# Patient Record
Sex: Female | Born: 1970 | Race: Black or African American | Hispanic: No | Marital: Single | Smoking: Current every day smoker
Health system: Southern US, Community
[De-identification: ages and names within clinical notes are randomized; demographics above are authoritative.]

## PROBLEM LIST (undated history)

## (undated) ENCOUNTER — Ambulatory Visit

## (undated) DIAGNOSIS — F329 Major depressive disorder, single episode, unspecified: Secondary | ICD-10-CM

## (undated) DIAGNOSIS — Q513 Bicornate uterus: Secondary | ICD-10-CM

## (undated) DIAGNOSIS — F32A Depression, unspecified: Secondary | ICD-10-CM

## (undated) DIAGNOSIS — M549 Dorsalgia, unspecified: Secondary | ICD-10-CM

## (undated) DIAGNOSIS — M4800 Spinal stenosis, site unspecified: Secondary | ICD-10-CM

## (undated) DIAGNOSIS — I10 Essential (primary) hypertension: Secondary | ICD-10-CM

## (undated) DIAGNOSIS — F419 Anxiety disorder, unspecified: Secondary | ICD-10-CM

## (undated) DIAGNOSIS — G8929 Other chronic pain: Secondary | ICD-10-CM

## (undated) DIAGNOSIS — M1990 Unspecified osteoarthritis, unspecified site: Secondary | ICD-10-CM

## (undated) HISTORY — PX: TUBAL LIGATION: SHX77

## (undated) HISTORY — DX: Bicornate uterus: Q51.3

## (undated) HISTORY — DX: Other chronic pain: G89.29

## (undated) HISTORY — DX: Dorsalgia, unspecified: M54.9

## (undated) HISTORY — DX: Major depressive disorder, single episode, unspecified: F32.9

## (undated) HISTORY — DX: Essential (primary) hypertension: I10

## (undated) HISTORY — DX: Depression, unspecified: F32.A

## (undated) HISTORY — DX: Unspecified osteoarthritis, unspecified site: M19.90

## (undated) HISTORY — DX: Anxiety disorder, unspecified: F41.9

---

## 2002-05-30 ENCOUNTER — Emergency Department (HOSPITAL_COMMUNITY): Admission: EM | Admit: 2002-05-30 | Discharge: 2002-05-30 | Payer: Self-pay | Admitting: *Deleted

## 2003-02-08 ENCOUNTER — Ambulatory Visit (HOSPITAL_COMMUNITY): Admission: AD | Admit: 2003-02-08 | Discharge: 2003-02-08 | Payer: Self-pay | Admitting: Obstetrics and Gynecology

## 2003-02-14 ENCOUNTER — Emergency Department (HOSPITAL_COMMUNITY): Admission: EM | Admit: 2003-02-14 | Discharge: 2003-02-14 | Payer: Self-pay | Admitting: Emergency Medicine

## 2003-05-30 ENCOUNTER — Ambulatory Visit (HOSPITAL_COMMUNITY): Admission: AD | Admit: 2003-05-30 | Discharge: 2003-05-30 | Payer: Self-pay | Admitting: Internal Medicine

## 2003-06-17 ENCOUNTER — Inpatient Hospital Stay (HOSPITAL_COMMUNITY): Admission: RE | Admit: 2003-06-17 | Discharge: 2003-06-19 | Payer: Self-pay | Admitting: Obstetrics and Gynecology

## 2007-08-28 ENCOUNTER — Encounter: Payer: Self-pay | Admitting: Orthopedic Surgery

## 2007-08-28 ENCOUNTER — Emergency Department (HOSPITAL_COMMUNITY): Admission: EM | Admit: 2007-08-28 | Discharge: 2007-08-28 | Payer: Self-pay | Admitting: Emergency Medicine

## 2007-09-01 ENCOUNTER — Ambulatory Visit: Payer: Self-pay | Admitting: Orthopedic Surgery

## 2007-09-01 DIAGNOSIS — S8263XA Displaced fracture of lateral malleolus of unspecified fibula, initial encounter for closed fracture: Secondary | ICD-10-CM | POA: Insufficient documentation

## 2007-09-02 ENCOUNTER — Encounter: Payer: Self-pay | Admitting: Orthopedic Surgery

## 2007-10-19 ENCOUNTER — Ambulatory Visit: Payer: Self-pay | Admitting: Orthopedic Surgery

## 2007-10-19 ENCOUNTER — Telehealth: Payer: Self-pay | Admitting: Orthopedic Surgery

## 2007-12-03 ENCOUNTER — Encounter: Payer: Self-pay | Admitting: Orthopedic Surgery

## 2007-12-17 ENCOUNTER — Encounter: Payer: Self-pay | Admitting: Orthopedic Surgery

## 2008-11-26 ENCOUNTER — Emergency Department (HOSPITAL_COMMUNITY): Admission: EM | Admit: 2008-11-26 | Discharge: 2008-11-26 | Payer: Self-pay | Admitting: Emergency Medicine

## 2010-05-16 ENCOUNTER — Emergency Department (HOSPITAL_COMMUNITY): Admission: EM | Admit: 2010-05-16 | Discharge: 2010-05-16 | Payer: Self-pay | Admitting: Emergency Medicine

## 2011-02-01 NOTE — Op Note (Signed)
NAME:  Tiffany Morgan, Tiffany Morgan                       ACCOUNT NO.:  [id number]   MEDICAL RECORD NO.:  [id number]                   PATIENT TYPE:  INP   LOCATION:  LDR3                                 FACILITY:  [facility]   PHYSICIAN:  Tilda Burrow, M.D.              DATE OF BIRTH:  1970/11/18   DATE OF PROCEDURE:  06/17/2003  DATE OF DISCHARGE:                                 OPERATIVE REPORT   PREOPERATIVE DIAGNOSES:  1. Pregnancy, 39 weeks.  2. History of breech presentation.  3. Prior cesarean section, not for trial of labor.  4. Desire for elective permanent sterilization.  5. Bicornuate uterus.   POSTOPERATIVE DIAGNOSES:  1. Pregnancy, 39 weeks.  2. History of breech presentation.  3. Prior cesarean section, not for trial of labor.  4. Desire for elective permanent sterilization.  5. Entero-ovarian band.  6. Bicornuate uterus.   PROCEDURES:  1. Repeat low transverse cervical cesarean section.  2. Bilateral partial salpingectomy.  3. Excision of entero-ovarian band.  4. Wide excision of cicatrix.   SURGEON:  Tilda Burrow, M.D.   ANESTHESIA:  Spinal, Johnney Killian, M.D.   COMPLICATIONS:  None.   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  The right  tube was much shorter than the left tube.  The left tube was unusually long,  probably greater than 15 cm in length, with right tube approximately 6 cm  total length.   DESCRIPTION OF PROCEDURE:  The patient was taken to the operating room,  prepped and draped for lower abdominal surgery after spinal anesthesia  introduced.  A Pfannenstiel incision was excised widely, taking out the old  cicatrix, and then we entered the abdominal cavity without difficulty.  The  bladder flap was quite high on the lower uterine segment and was taken down.  Breech  presentation was confirmed with feet being the presenting part.  The  bladder flap was developed, lower uterine segment opened transversely,  extended laterally using index finger traction, membranes ruptured revealing  clear fluid, and the feet grasped for extraction with fundal pressure used  to guide the infant.  Sweeping of the arms forward was performed in a  standard fashion and head delivered with finger in the mouth to guide the  vertex.  Suctioning of the amniotic fluid out of the mouth was performed  upon visualization and the baby cried vigorously.  Francoise Schaumann. Halm, D.O.,  managed the baby.   Apgars of 9 and 9 were assigned by Dr. Milford Cage.  Cord blood samples were  obtained and placenta delivered.  Palpation of the interior of the uterine  cavity revealed the two separate uterine portions.  There was none of the  placenta in the right uterine cavity.  It  was much smaller than the left.  Placenta delivered intact, Schultze presentation, and antibiotic irrigation  of the uterus was performed followed by single-layer running locking  closure.  Chromic 2-0 closure of the bladder flap followed.   The uterus was exteriorized to allow for access to the uterine fundus, and  we then tied the tubes.  The right tube was very short and had phimotic  fimbriae.  We did a distal salpingectomy on this side, taking out the right  distal two-thirds of the tube with ligation with 0 chromic.  The left tube  was unusually elongate and was grasped and a midsegment knuckle of the tube  excised and sent for histology.  Inspection of the ovaries revealed them to  be quite elongate and flat and, surprisingly, had an approximately 15 cm  long, 5 mm wide flexible vascular band between them that was taken out with  a separate specimen.  The uterus was repositioned in the abdomen, the  anterior peritoneum closed with 2-0 chromic, fascia closed with 0 Vicryl,  and subcu tissues reapproximated with 2-0 plain.  It  should be noted that  the fascia was trimmed of some old fibrosis, which resulted in improved  abdominal tone.  Staple closure of the skin completed the procedure with  flat JP drain placed in the subcu space.       ___________________________________________                                            Tilda Burrow, M.D.   JVF/MEDQ  D:  06/17/2003  T:  06/18/2003  Job:  [id number]

## 2011-02-01 NOTE — H&P (Signed)
NAMEAILEENA, Tiffany Morgan NO.:  [id number]   MEDICAL RECORD NO.:  [id number]                    PATIENT TYPE:   LOCATION:                                       FACILITY:   PHYSICIAN:  Tilda Burrow, M.D.              DATE OF BIRTH:  28-Sep-1970   DATE OF ADMISSION:  06/17/2003  DATE OF DISCHARGE:                                HISTORY & PHYSICAL   ADMISSION DIAGNOSES:  Pregnancy, 38-1/[redacted] weeks gestation, prior cesarean  section, not for trial of labor.  History of bicornuate uterus.  Recurrent  breech presentation.  Desire for elective permanent sterilization.   HISTORY OF PRESENT ILLNESS:  This 40 year old female gravida 3, para 1, AB  1, prior cesarean section 1988, performed for fetal distress, performed at  [facility], is admitted at this time for repeat cesarean section.  She has been seen in our office through a pregnancy course since March 19th  with appropriate weight gain and fundal height and growth.  EDC by menstrual  history is 06/27/03, with 19 weeks' ultrasound suggesting October 16, and  first ultrasound at 11-1/2 weeks suggesting July 04, 2003.  She has had a  persistent breech presentation. She desires to proceed with cesarean  delivery.   PAST MEDICAL HISTORY:  Positive for bicornuate uterus.   PAST SURGICAL HISTORY:  Cesarean section 1988, hysterotomy 1990 for a  pregnancy termination that had complications due to a twin pregnancy that  required uterine incision.   ALLERGIES:  NONE.   MEDICATIONS:  Prenatal vitamins.   SOCIAL HISTORY:  Single, stable relationship x9 years, unemployed, lives  with sister.   PHYSICAL EXAMINATION:  VITAL SIGNS:  Height 5 feet 7 inches, weight 190,  blood pressure 122/72.  GENERAL:  A healthy-appearing African-American female, alert and oriented  x3.  HEENT:  Pupils are equal, round and reactive.  Extraocular movements intact.  NECK:  Supple, trachea midline.  LUNGS:  Clear.  ABDOMEN:  Gravid uterus with obvious breech presentation with vertex in the  upper abdomen.  Estimated fetal weight 7 pounds.  PELVIC:  Cervix closed and high at last check.    PRENATAL LABORATORY DATA:  Blood type A negative with RhoGAM administered  July, Rubella immunity  present. Hemoglobin 13, hematocrit 40. Heparin, HIV,  GC, Chlamydia, RPR all negative.  Urine drug screen positive for marijuana  at initial pregnancy intake history.   PLAN:  Repeat cesarean section, tubal ligation on June 17, 2003.     ___________________________________________                                         Tilda Burrow, M.D.   JVF/MEDQ  D:  06/07/2003  T:  06/07/2003  Job:  [id number]   cc:   Francoise Schaumann. Halm, D.O.  [phone]  [place]  [place] [address]  Fax: [phone]

## 2011-02-01 NOTE — Discharge Summary (Signed)
NAME:  Tiffany Morgan, Tiffany Morgan                       ACCOUNT NO.:  [id number]   MEDICAL RECORD NO.:  [id number]                   PATIENT TYPE:  INP   LOCATION:  LDR3                                 FACILITY:  [facility]   PHYSICIAN:  Tilda Burrow, M.D.              DATE OF BIRTH:  [birth year]   DATE OF ADMISSION:  06/17/2003  DATE OF DISCHARGE:  06/19/2003                                 DISCHARGE SUMMARY   ADMISSION DIAGNOSES:  1. Pregnancy at 39 weeks with prior cesarean section, not for trial of     labor, with bicornuate uterus.  2. Elected permanent sterilization.   DISCHARGE DIAGNOSES:  1. Pregnancy at 39 weeks, delivered.  2. Elected permanent sterilization.  3. Bicornuate uterus.  4. Intraovarian adhesive ban, probably congenital.   DISCHARGE MEDICATIONS:  1. Tylox one to two q.4h. p.r.n. pain.  2. Motrin 800 mg one every eight hours for mild discomfort.  3. The patient is bottle-feeding.   HOSPITAL COURSE:  Ms. Sendejo was admitted on June 17, 2003, for repeat  cesarean section and elective sterilization.  She was known to have  bicornuate uterus and the infant was known to be in a breech presentation.  Procedure was uncomplicated and the infant was delivered from a footling  breech presentation delivering a healthy female infant.  Interestingly,  there was some uterine anomalies encountered.  There was a broad, 5 mm wide  x 20 cm long band of connective tissue between the ovaries that had a  congenital appearance due to perfectly symmetric character and smooth  surfaces.  This was transected and removed to reduce potential for  intraabdominal herniation site.  The uterus was noted to be bicornuate.  The  right tube was very short and left tube extremely long.  The infant and the  placenta were located in the left side of the uterus.  Postoperatively, the  patient did excellent with hemoglobin 12.7 and hematocrit 37.1 compared to  13.1 and 37.8 on admission.  The AJP drain  was placed in the subcu fatty  tissue after a wide excision performed.  The patient did well and tolerated  a regular diet.  She was stable for discharge at 48 hours with JP drain  removed at that time and followup scheduled x5 days from now for staple  removal.     ___________________________________________                                         Tilda Burrow, M.D.   JVF/MEDQ  D:  06/19/2003  T:  06/19/2003  Job:  [id number]

## 2013-06-29 ENCOUNTER — Encounter (HOSPITAL_COMMUNITY): Payer: Self-pay | Admitting: Emergency Medicine

## 2013-06-29 ENCOUNTER — Emergency Department (HOSPITAL_COMMUNITY)
Admission: EM | Admit: 2013-06-29 | Discharge: 2013-06-29 | Disposition: A | Discharge status: Home / Self Care | Payer: PRIVATE HEALTH INSURANCE | Attending: Emergency Medicine | Admitting: Emergency Medicine

## 2013-06-29 DIAGNOSIS — R110 Nausea: Secondary | ICD-10-CM | POA: Insufficient documentation

## 2013-06-29 DIAGNOSIS — Y929 Unspecified place or not applicable: Secondary | ICD-10-CM | POA: Insufficient documentation

## 2013-06-29 DIAGNOSIS — S025XXA Fracture of tooth (traumatic), initial encounter for closed fracture: Secondary | ICD-10-CM | POA: Insufficient documentation

## 2013-06-29 DIAGNOSIS — K029 Dental caries, unspecified: Secondary | ICD-10-CM | POA: Insufficient documentation

## 2013-06-29 DIAGNOSIS — K047 Periapical abscess without sinus: Secondary | ICD-10-CM

## 2013-06-29 DIAGNOSIS — R51 Headache: Secondary | ICD-10-CM | POA: Insufficient documentation

## 2013-06-29 DIAGNOSIS — F17200 Nicotine dependence, unspecified, uncomplicated: Secondary | ICD-10-CM | POA: Insufficient documentation

## 2013-06-29 DIAGNOSIS — X58XXXA Exposure to other specified factors, initial encounter: Secondary | ICD-10-CM | POA: Insufficient documentation

## 2013-06-29 DIAGNOSIS — Y939 Activity, unspecified: Secondary | ICD-10-CM | POA: Insufficient documentation

## 2013-06-29 MED ORDER — AMOXICILLIN 500 MG PO CAPS: 500.0000 mg | ORAL_CAPSULE | Freq: Three times a day (TID) | ORAL | Status: DC

## 2013-06-29 MED ORDER — HYDROCODONE-ACETAMINOPHEN 5-325 MG PO TABS: 1.0000 | ORAL_TABLET | ORAL | Status: DC | PRN

## 2013-06-29 NOTE — ED Notes (Signed)
Pt reports dental pain for the past 3 days.  Pt reports trying to see her dentist this a.m but they refused to see her unless she could pay today.  Pt reports the tooth is broken.

## 2013-06-29 NOTE — ED Provider Notes (Signed)
CSN: [id number]     Arrival date & time 06/29/13  1008 History   First MD Initiated Contact with Patient 06/29/13 1045     Chief Complaint  Patient presents with  . Dental Pain   (Consider location/radiation/quality/duration/timing/severity/associated sxs/prior Treatment) Patient is a 42 y.o. female presenting with tooth pain. The history is provided by the patient.  Dental Pain Location:  Lower Lower teeth location:  31/RL 2nd molar Quality:  Throbbing and constant Severity:  Severe (10/10) Onset quality:  Gradual Duration:  3 days Timing:  Constant Progression:  Worsening Chronicity:  New Context: dental caries and dental fracture   Associated symptoms: headaches   Associated symptoms: no fever and no neck pain    Tiffany Morgan is a 42 y.o. female who presents to the ED with dental pain. She states that she broke a tooth in the lower left side about 2 weeks ago but did not start having pain until 3 days ago. The pain has gotten progressively worse despite taking goody powder, tylenol, tylenol PM, Motrin and Advil.    History reviewed. No pertinent past medical history. Past Surgical History  Procedure Laterality Date  . Cesarean section     No family history on file. History  Substance Use Topics  . Smoking status: Current Every Day Smoker  . Smokeless tobacco: Not on file  . Alcohol Use: No   OB History   Grav Para Term Preterm Abortions TAB SAB Ect Mult Living                 Review of Systems  Constitutional: Negative for fever and chills.  HENT: Positive for dental problem. Negative for ear pain, sore throat and trouble swallowing.   Eyes: Negative for visual disturbance.  Respiratory: Negative for cough and shortness of breath.   Gastrointestinal: Positive for nausea. Negative for vomiting and abdominal pain.  Genitourinary: Negative for dysuria and urgency.  Musculoskeletal: Negative for neck pain.  Neurological: Positive for headaches. Negative for  dizziness.  Psychiatric/Behavioral: The patient is not nervous/anxious.     Allergies  Review of patient's allergies indicates no known allergies.  Home Medications   Current Outpatient Rx  Name  Route  Sig  Dispense  Refill  . acetaminophen (TYLENOL) 500 MG tablet   Oral   Take 500 mg by mouth every 6 (six) hours as needed for pain.         . Aspirin-Acetaminophen (GOODYS BODY PAIN PO)   Oral   Take 1 packet by mouth every 6 (six) hours as needed (pain).         Marland Kitchen ibuprofen (ADVIL,MOTRIN) 200 MG tablet   Oral   Take 800 mg by mouth every 4 (four) hours as needed for pain.          BP 118/82  Pulse 91  Temp(Src) 98.3 F (36.8 C) (Oral)  Resp 18  Ht 5\' 7"  (1.702 m)  Wt 150 lb (68.04 kg)  BMI 23.49 kg/m2  SpO2 99%  LMP 06/16/2013 Physical Exam  Nursing note and vitals reviewed. Constitutional: She is oriented to person, place, and time. She appears well-developed and well-nourished. No distress.  HENT:  Head: Normocephalic.  Mouth/Throat: Uvula is midline, oropharynx is clear and moist and mucous membranes are normal.    Lower left second molar with decay, broken and abscessed. Tenderness and swelling of gum surrounding the tooth.  Eyes: Conjunctivae and EOM are normal.  Neck: Neck supple.  Cardiovascular: Normal rate, regular rhythm and normal  heart sounds.   Pulmonary/Chest: Effort normal and breath sounds normal.  Musculoskeletal: Normal range of motion.  Lymphadenopathy:    She has cervical adenopathy (left).  Neurological: She is alert and oriented to person, place, and time. No cranial nerve deficit.  Skin: Skin is warm and dry.  Psychiatric: She has a normal mood and affect. Her behavior is normal.    ED Course  Procedures   MDM  42 y.o. female with dental abscess, broken tooth, carries and pain. Will treat symptoms and she has a dentist that she can follow up with. She is stable for discharge home without any immediate complications.  Discussed  with the patient and all questioned fully answered. She will return if any problems arise.   Medication List    TAKE these medications       amoxicillin 500 MG capsule  Commonly known as:  AMOXIL  Take 1 capsule (500 mg total) by mouth 3 (three) times daily.     HYDROcodone-acetaminophen 5-325 MG per tablet  Commonly known as:  NORCO/VICODIN  Take 1 tablet by mouth every 4 (four) hours as needed.      ASK your doctor about these medications       acetaminophen 500 MG tablet  Commonly known as:  TYLENOL  Take 500 mg by mouth every 6 (six) hours as needed for pain.     GOODYS BODY PAIN PO  Take 1 packet by mouth every 6 (six) hours as needed (pain).     ibuprofen 200 MG tablet  Commonly known as:  ADVIL,MOTRIN  Take 800 mg by mouth every 4 (four) hours as needed for pain.            Janne Napoleon, [place] 06/29/13 [phone]

## 2013-06-29 NOTE — ED Provider Notes (Signed)
Medical screening examination/treatment/procedure(s) were performed by non-physician practitioner and as supervising physician I was immediately available for consultation/collaboration.   Areesha Dehaven L Jedrick Hutcherson, MD 06/29/13 1614 

## 2013-07-21 ENCOUNTER — Other Ambulatory Visit (HOSPITAL_COMMUNITY): Payer: Self-pay | Admitting: *Deleted

## 2013-07-21 DIAGNOSIS — Z1231 Encounter for screening mammogram for malignant neoplasm of breast: Secondary | ICD-10-CM

## 2013-07-27 ENCOUNTER — Ambulatory Visit (HOSPITAL_COMMUNITY)
Admission: RE | Admit: 2013-07-27 | Discharge: 2013-07-27 | Disposition: A | Discharge status: Home / Self Care | Payer: PRIVATE HEALTH INSURANCE | Source: Ambulatory Visit | Attending: *Deleted | Admitting: *Deleted

## 2013-07-27 DIAGNOSIS — Z1231 Encounter for screening mammogram for malignant neoplasm of breast: Secondary | ICD-10-CM

## 2013-07-29 ENCOUNTER — Other Ambulatory Visit: Payer: Self-pay | Admitting: *Deleted

## 2013-07-29 DIAGNOSIS — R928 Other abnormal and inconclusive findings on diagnostic imaging of breast: Secondary | ICD-10-CM

## 2013-08-11 ENCOUNTER — Other Ambulatory Visit: Payer: Self-pay | Admitting: *Deleted

## 2013-08-11 ENCOUNTER — Ambulatory Visit (HOSPITAL_COMMUNITY)
Admission: RE | Admit: 2013-08-11 | Discharge: 2013-08-11 | Disposition: A | Discharge status: Home / Self Care | Payer: PRIVATE HEALTH INSURANCE | Source: Ambulatory Visit | Attending: *Deleted | Admitting: *Deleted

## 2013-08-11 DIAGNOSIS — R928 Other abnormal and inconclusive findings on diagnostic imaging of breast: Secondary | ICD-10-CM | POA: Insufficient documentation

## 2013-08-11 DIAGNOSIS — R921 Mammographic calcification found on diagnostic imaging of breast: Secondary | ICD-10-CM

## 2013-08-17 ENCOUNTER — Ambulatory Visit
Admission: RE | Admit: 2013-08-17 | Discharge: 2013-08-17 | Disposition: A | Discharge status: Home / Self Care | Payer: No Typology Code available for payment source | Source: Ambulatory Visit | Attending: *Deleted | Admitting: *Deleted

## 2013-08-17 DIAGNOSIS — R921 Mammographic calcification found on diagnostic imaging of breast: Secondary | ICD-10-CM

## 2013-08-17 HISTORY — PX: BREAST BIOPSY: SHX20

## 2014-05-07 ENCOUNTER — Encounter (HOSPITAL_COMMUNITY): Payer: Self-pay | Admitting: Emergency Medicine

## 2014-05-07 ENCOUNTER — Emergency Department (HOSPITAL_COMMUNITY)
Admission: EM | Admit: 2014-05-07 | Discharge: 2014-05-07 | Disposition: A | Discharge status: Home / Self Care | Payer: PRIVATE HEALTH INSURANCE | Attending: Emergency Medicine | Admitting: Emergency Medicine

## 2014-05-07 DIAGNOSIS — R109 Unspecified abdominal pain: Secondary | ICD-10-CM | POA: Insufficient documentation

## 2014-05-07 DIAGNOSIS — R5383 Other fatigue: Secondary | ICD-10-CM

## 2014-05-07 DIAGNOSIS — F17200 Nicotine dependence, unspecified, uncomplicated: Secondary | ICD-10-CM | POA: Insufficient documentation

## 2014-05-07 DIAGNOSIS — N390 Urinary tract infection, site not specified: Secondary | ICD-10-CM | POA: Insufficient documentation

## 2014-05-07 DIAGNOSIS — Z9889 Other specified postprocedural states: Secondary | ICD-10-CM | POA: Insufficient documentation

## 2014-05-07 DIAGNOSIS — R5381 Other malaise: Secondary | ICD-10-CM | POA: Insufficient documentation

## 2014-05-07 LAB — URINALYSIS, ROUTINE W REFLEX MICROSCOPIC
BILIRUBIN URINE: NEGATIVE
Glucose, UA: NEGATIVE mg/dL
KETONES UR: NEGATIVE mg/dL
NITRITE: POSITIVE — AB
PH: 6 (ref 5.0–8.0)
Protein, ur: NEGATIVE mg/dL
UROBILINOGEN UA: 0.2 mg/dL (ref 0.0–1.0)

## 2014-05-07 LAB — URINE MICROSCOPIC-ADD ON

## 2014-05-07 MED ORDER — HYDROCODONE-ACETAMINOPHEN 5-325 MG PO TABS: 1.0000 | ORAL_TABLET | ORAL | Status: DC | PRN

## 2014-05-07 MED ORDER — ONDANSETRON HCL 4 MG PO TABS
4.0000 mg | ORAL_TABLET | Freq: Once | ORAL | Status: AC
  Administered 2014-05-07: 4 mg via ORAL
  Filled 2014-05-07: qty 1

## 2014-05-07 MED ORDER — ACETAMINOPHEN 500 MG PO TABS
1000.0000 mg | ORAL_TABLET | Freq: Once | ORAL | Status: AC
  Administered 2014-05-07: 1000 mg via ORAL
  Filled 2014-05-07: qty 2

## 2014-05-07 MED ORDER — IBUPROFEN 800 MG PO TABS
800.0000 mg | ORAL_TABLET | Freq: Once | ORAL | Status: AC
  Administered 2014-05-07: 800 mg via ORAL
  Filled 2014-05-07: qty 1

## 2014-05-07 MED ORDER — CEPHALEXIN 500 MG PO CAPS: 500.0000 mg | ORAL_CAPSULE | Freq: Four times a day (QID) | ORAL | Status: DC

## 2014-05-07 MED ORDER — CEPHALEXIN 500 MG PO CAPS
500.0000 mg | ORAL_CAPSULE | Freq: Once | ORAL | Status: AC
  Administered 2014-05-07: 500 mg via ORAL
  Filled 2014-05-07: qty 1

## 2014-05-07 NOTE — ED Notes (Signed)
Abdominal pain, and having pain in my legs per pt.

## 2014-05-07 NOTE — ED Provider Notes (Signed)
CSN: [id number]     Arrival date & time 05/07/14  1901 History   First MD Initiated Contact with Patient 05/07/14 2055     Chief Complaint  Patient presents with  . Abdominal Pain     (Consider location/radiation/quality/duration/timing/severity/associated sxs/prior Treatment) HPI Comments: Patient is a 43 year old female who presents to the emergency department with complaint of lower abdomen pain. The patient states that over the last 4 or 5 days she's been having increasing lower abdominal pain. The pain is now extending into her legs. The patient states she has tried Tylenol and Goody powders but these are only minimally 6 sizable in helping her discomfort. The patient denies any recent injury to her back, abdomen, or legs. She states she has had some increase in urinary frequency, but no other symptoms. No fever reported. No blood in the urine reported. His been no nausea or vomiting reported. Patient presents at this time for assistance with these symptoms.  Patient is a 43 y.o. female presenting with abdominal pain. The history is provided by the patient.  Abdominal Pain Associated symptoms: fatigue   Associated symptoms: no chest pain, no cough, no dysuria, no hematuria and no shortness of breath     History reviewed. No pertinent past medical history. Past Surgical History  Procedure Laterality Date  . Cesarean section     No family history on file. History  Substance Use Topics  . Smoking status: Current Every Day Smoker  . Smokeless tobacco: Not on file  . Alcohol Use: No   OB History   Grav Para Term Preterm Abortions TAB SAB Ect Mult Living                 Review of Systems  Constitutional: Positive for fatigue. Negative for activity change.       All ROS Neg except as noted in HPI  HENT: Negative for nosebleeds.   Eyes: Negative for photophobia and discharge.  Respiratory: Negative for cough, shortness of breath and wheezing.   Cardiovascular: Negative for chest  pain and palpitations.  Gastrointestinal: Positive for abdominal pain. Negative for blood in stool.  Genitourinary: Positive for frequency. Negative for dysuria and hematuria.  Musculoskeletal: Positive for myalgias. Negative for arthralgias, back pain and neck pain.  Skin: Negative.   Neurological: Negative for dizziness, seizures and speech difficulty.  Psychiatric/Behavioral: Negative for hallucinations and confusion.      Allergies  Review of patient's allergies indicates no known allergies.  Home Medications   Prior to Admission medications   Medication Sig Start Date End Date Taking? Authorizing Provider  acetaminophen (TYLENOL) 500 MG tablet Take 500 mg by mouth every 6 (six) hours as needed for pain.    Historical Provider, MD  Aspirin-Acetaminophen (GOODYS BODY PAIN PO) Take 1 packet by mouth every 6 (six) hours as needed (pain).    Historical Provider, MD  ibuprofen (ADVIL,MOTRIN) 200 MG tablet Take 800 mg by mouth every 4 (four) hours as needed for pain.    Historical Provider, MD   BP 158/86  Pulse 81  Temp(Src) 98.7 F (37.1 C) (Oral)  Resp 20  Ht 5\' 7"  (1.702 m)  Wt 160 lb 8 oz (72.802 kg)  BMI 25.13 kg/m2  SpO2 100%  LMP 04/18/2014 Physical Exam  Nursing note and vitals reviewed. Constitutional: She is oriented to person, place, and time. She appears well-developed and well-nourished.  Non-toxic appearance.  HENT:  Head: Normocephalic.  Right Ear: Tympanic membrane and external ear normal.  Left Ear: Tympanic  membrane and external ear normal.  Eyes: EOM and lids are normal. Pupils are equal, round, and reactive to light.  Neck: Normal range of motion. Neck supple. Carotid bruit is not present.  Cardiovascular: Normal rate, regular rhythm, normal heart sounds, intact distal pulses and normal pulses.   Pulmonary/Chest: Breath sounds normal. No respiratory distress.  Abdominal: Soft. Bowel sounds are normal. There is no tenderness. There is no guarding.  There  is discomfort to palpation suprapubic area. Abdomen is soft with good bowel sounds. No mass appreciated. No distention. No pulsating mass. There is right CVA tenderness.  Musculoskeletal: Normal range of motion.  Lymphadenopathy:       Head (right side): No submandibular adenopathy present.       Head (left side): No submandibular adenopathy present.    She has no cervical adenopathy.  Neurological: She is alert and oriented to person, place, and time. She has normal strength. No cranial nerve deficit or sensory deficit.  Skin: Skin is warm and dry.  Psychiatric: She has a normal mood and affect. Her speech is normal.    ED Course  Procedures (including critical care time) Labs Review Labs Reviewed  URINALYSIS, ROUTINE W REFLEX MICROSCOPIC - Abnormal; Notable for the following:    APPearance HAZY (*)    Specific Gravity, Urine <1.005 (*)    Hgb urine dipstick TRACE (*)    Nitrite POSITIVE (*)    Leukocytes, UA TRACE (*)    All other components within normal limits  URINE MICROSCOPIC-ADD ON - Abnormal; Notable for the following:    Bacteria, UA MANY (*)    All other components within normal limits  URINE CULTURE    Imaging Review No results found.   EKG Interpretation None      MDM  Vital signs are within normal limits. Pulse oximetry 100% on room air. Within normal limits by my interpretation. Urinalysis reveals a hazy specimen with a specific gravity less than 1.005. There is trace hemoglobin, and positive nitrates, trace leukocyte esterase is present with many bacteria present. A culture of the urine has been sent to the lab.  The patient will be treated with Keflex 500 mg, and a prescription for Norco was also given to the patient for discomfort. The patient is advised to see her primary physician, or return to the emergency department if any high fevers, increasing back or abdomen pain, or deterioration in her general condition.    Final diagnoses:  None    *I have  reviewed nursing notes, vital signs, and all appropriate lab and imaging results for this patient.Kathie Dike**    Kaeden Depaz M Maranatha Grossi, PA-C 05/07/14 2155

## 2014-05-07 NOTE — Discharge Instructions (Signed)
Your tests reveal a urinary tract infection. A culture has been sent to the lab. Please use Keflex and 600 mg of ibuprofen 4 times daily (with each meal and at bedtime). Please use Norco for pain if needed. This medication may cause drowsiness, please use with caution. Please increase water and juices. Please return to your primary physician, or to the emergency department if any nausea, vomiting, high fever, excessive back pain, excessive abdominal pain, or deterioration in her general condition. Urinary Tract Infection Urinary tract infections (UTIs) can develop anywhere along your urinary tract. Your urinary tract is your body's drainage system for removing wastes and extra water. Your urinary tract includes two kidneys, two ureters, a bladder, and a urethra. Your kidneys are a pair of bean-shaped organs. Each kidney is about the size of your fist. They are located below your ribs, one on each side of your spine. CAUSES Infections are caused by microbes, which are microscopic organisms, including fungi, viruses, and bacteria. These organisms are so small that they can only be seen through a microscope. Bacteria are the microbes that most commonly cause UTIs. SYMPTOMS  Symptoms of UTIs may vary by age and gender of the patient and by the location of the infection. Symptoms in young women typically include a frequent and intense urge to urinate and a painful, burning feeling in the bladder or urethra during urination. Older women and men are more likely to be tired, shaky, and weak and have muscle aches and abdominal pain. A fever may mean the infection is in your kidneys. Other symptoms of a kidney infection include pain in your back or sides below the ribs, nausea, and vomiting. DIAGNOSIS To diagnose a UTI, your caregiver will ask you about your symptoms. Your caregiver also will ask to provide a urine sample. The urine sample will be tested for bacteria and white blood cells. White blood cells are made  by your body to help fight infection. TREATMENT  Typically, UTIs can be treated with medication. Because most UTIs are caused by a bacterial infection, they usually can be treated with the use of antibiotics. The choice of antibiotic and length of treatment depend on your symptoms and the type of bacteria causing your infection. HOME CARE INSTRUCTIONS  If you were prescribed antibiotics, take them exactly as your caregiver instructs you. Finish the medication even if you feel better after you have only taken some of the medication.  Drink enough water and fluids to keep your urine clear or pale yellow.  Avoid caffeine, tea, and carbonated beverages. They tend to irritate your bladder.  Empty your bladder often. Avoid holding urine for long periods of time.  Empty your bladder before and after sexual intercourse.  After a bowel movement, women should cleanse from front to back. Use each tissue only once. SEEK MEDICAL CARE IF:   You have back pain.  You develop a fever.  Your symptoms do not begin to resolve within 3 days. SEEK IMMEDIATE MEDICAL CARE IF:   You have severe back pain or lower abdominal pain.  You develop chills.  You have nausea or vomiting.  You have continued burning or discomfort with urination. MAKE SURE YOU:   Understand these instructions.  Will watch your condition.  Will get help right away if you are not doing well or get worse. Document Released: 06/12/2005 Document Revised: 03/03/2012 Document Reviewed: 10/11/2011 [facility] Patient Information 2015 [employer], [place]. This information is not intended to replace advice given to you by your health  care provider. Make sure you discuss any questions you have with your health care provider.

## 2014-05-08 NOTE — ED Provider Notes (Signed)
Medical screening examination/treatment/procedure(s) were performed by non-physician practitioner and as supervising physician I was immediately available for consultation/collaboration.   EKG Interpretation None        Glynn Octave, MD 05/08/14 [phone]

## 2014-05-10 LAB — URINE CULTURE

## 2014-05-11 ENCOUNTER — Telehealth (HOSPITAL_COMMUNITY): Payer: Self-pay

## 2014-05-11 NOTE — ED Notes (Signed)
Post ED Visit - Positive Culture Follow-up  Culture report reviewed by antimicrobial stewardship pharmacist:  Wes Dulaney, Pharm.D., BCPS  Celedonio Miyamoto, [address].D., BCPS  Georgina Pillion, [address].D., BCPS  [place], [place].D., BCPS, AAHIVP  Estella Husk, Pharm.D., BCPS, AAHIVP  Red Christians, Pharm.D.  Tennis Must, Pharm.D.  Positive urine culture Treated with cephalexin, organism sensitive to the same and no further patient follow-up is required at this time.  Ashley Jacobs 05/11/2014, 10:25 AM

## 2014-06-20 ENCOUNTER — Encounter (HOSPITAL_COMMUNITY): Payer: Self-pay | Admitting: Emergency Medicine

## 2014-06-20 ENCOUNTER — Emergency Department (HOSPITAL_COMMUNITY)
Admission: EM | Admit: 2014-06-20 | Discharge: 2014-06-20 | Disposition: A | Discharge status: Home / Self Care | Payer: PRIVATE HEALTH INSURANCE | Attending: Emergency Medicine | Admitting: Emergency Medicine

## 2014-06-20 ENCOUNTER — Emergency Department (HOSPITAL_COMMUNITY): Payer: PRIVATE HEALTH INSURANCE

## 2014-06-20 DIAGNOSIS — Z720 Tobacco use: Secondary | ICD-10-CM | POA: Insufficient documentation

## 2014-06-20 DIAGNOSIS — R079 Chest pain, unspecified: Secondary | ICD-10-CM | POA: Insufficient documentation

## 2014-06-20 DIAGNOSIS — Z880 Allergy status to penicillin: Secondary | ICD-10-CM | POA: Insufficient documentation

## 2014-06-20 DIAGNOSIS — Z8719 Personal history of other diseases of the digestive system: Secondary | ICD-10-CM | POA: Insufficient documentation

## 2014-06-20 DIAGNOSIS — I10 Essential (primary) hypertension: Secondary | ICD-10-CM | POA: Insufficient documentation

## 2014-06-20 DIAGNOSIS — E119 Type 2 diabetes mellitus without complications: Secondary | ICD-10-CM | POA: Insufficient documentation

## 2014-06-20 LAB — CBC WITH DIFFERENTIAL/PLATELET
Basophils Absolute: 0.1 10*3/uL (ref 0.0–0.1)
Basophils Relative: 1 % (ref 0–1)
EOS PCT: 4 % (ref 0–5)
Eosinophils Absolute: 0.5 10*3/uL (ref 0.0–0.7)
HCT: 39.1 % (ref 36.0–46.0)
Hemoglobin: 13.6 g/dL (ref 12.0–15.0)
LYMPHS ABS: 5.9 10*3/uL — AB (ref 0.7–4.0)
Lymphocytes Relative: 53 % — ABNORMAL HIGH (ref 12–46)
MCH: 31.9 pg (ref 26.0–34.0)
MCHC: 34.8 g/dL (ref 30.0–36.0)
MCV: 91.6 fL (ref 78.0–100.0)
MONO ABS: 0.5 10*3/uL (ref 0.1–1.0)
Monocytes Relative: 5 % (ref 3–12)
Neutro Abs: 4.2 10*3/uL (ref 1.7–7.7)
Neutrophils Relative %: 37 % — ABNORMAL LOW (ref 43–77)
Platelets: 338 10*3/uL (ref 150–400)
RBC: 4.27 MIL/uL (ref 3.87–5.11)
RDW: 14.9 % (ref 11.5–15.5)
WBC: 11.2 10*3/uL — ABNORMAL HIGH (ref 4.0–10.5)

## 2014-06-20 LAB — BASIC METABOLIC PANEL
ANION GAP: 11 (ref 5–15)
BUN: 9 mg/dL (ref 6–23)
CALCIUM: 9.5 mg/dL (ref 8.4–10.5)
CHLORIDE: 104 meq/L (ref 96–112)
CO2: 26 mEq/L (ref 19–32)
Creatinine, Ser: 0.78 mg/dL (ref 0.50–1.10)
GFR calc Af Amer: >90 mL/min (ref >90)
GFR calc non Af Amer: >90 mL/min (ref >90)
GLUCOSE: 103 mg/dL — AB (ref 70–99)
POTASSIUM: 3.9 meq/L (ref 3.7–5.3)
Sodium: 141 mEq/L (ref 137–147)

## 2014-06-20 LAB — TROPONIN I

## 2014-06-20 MED ORDER — GI COCKTAIL ~~LOC~~
30.0000 mL | Freq: Once | ORAL | Status: AC
  Administered 2014-06-20: 30 mL via ORAL
  Filled 2014-06-20: qty 30

## 2014-06-20 MED ORDER — ASPIRIN 81 MG PO CHEW
324.0000 mg | CHEWABLE_TABLET | Freq: Once | ORAL | Status: AC
  Administered 2014-06-20: 324 mg via ORAL
  Filled 2014-06-20: qty 4

## 2014-06-20 MED ORDER — OMEPRAZOLE 20 MG PO CPDR: 20.0000 mg | DELAYED_RELEASE_CAPSULE | Freq: Every day | ORAL | Status: DC

## 2014-06-20 NOTE — ED Notes (Signed)
Pt alert & oriented x4, stable gait. Patient given discharge instructions, paperwork & prescription(s). Patient  instructed to stop at the registration desk to finish any additional paperwork. Patient verbalized understanding. Pt left department w/ no further questions. 

## 2014-06-20 NOTE — Discharge Instructions (Signed)

## 2014-06-20 NOTE — ED Notes (Signed)
EKG performed upon arrival in room 2 in [facility].  Handed EKG to Dr Lars MageI Knapp.

## 2014-06-20 NOTE — ED Notes (Signed)
Pt c/o sharp chest pain that started 30 mins pta; pt states she was shopping when the chest pain started;

## 2014-06-20 NOTE — ED Provider Notes (Signed)
CSN: [id number]     Arrival date & time 06/20/14  4098 History   This chart was scribed for Lyanne Co, MD by Gwenyth Ober, ED Scribe. This patient was seen in room [facility] and the patient's care was started at 7:17 PM.    Chief Complaint  Patient presents with  . Chest Pain   The history is provided by the patient. No language interpreter was used.   HPI Comments: Tiffany Morgan is a 43 y.o. female with a history of GERD, HTN, and DM who presents to the Emergency Department complaining of sharp, intermittent chest pain that started 30 min PTA. Pt states that this is the fourth episode of similar symptoms in 3 weeks. Pt has not identified a common trigger for the symptoms. Pt states pain may last up to an hour. Pt has hx of GERD, but states current symptoms are not similar. 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 Pt denies nausea, vomiting and SOB today, but states nausea and vomitting have accompanied prior episodes. Pt denies a family hx of CAD <55 y/o. She is a current smoker.   History reviewed. No pertinent past medical history. Past Surgical History  Procedure Laterality Date  . Cesarean section     History reviewed. No pertinent family history. History  Substance Use Topics  . Smoking status: Current Every Day Smoker  . Smokeless tobacco: Not on file  . Alcohol Use: No   OB History   Grav Para Term Preterm Abortions TAB SAB Ect Mult Living                 Review of Systems  Respiratory: Negative for shortness of breath.   Cardiovascular: Positive for chest pain.  Gastrointestinal: Negative for nausea and vomiting.   10 Systems reviewed and all are negative for acute change except as noted in the HPI.  Allergies  Review of patient's allergies indicates no known allergies.  Home Medications   Prior to Admission medications   Medication Sig Start Date End Date Taking? Authorizing Provider  acetaminophen (TYLENOL) 500 MG tablet Take 500 mg by mouth  every 6 (six) hours as needed for pain.    Historical Provider, MD  Aspirin-Acetaminophen (GOODYS BODY PAIN PO) Take 1 packet by mouth every 6 (six) hours as needed (pain).    Historical Provider, MD  cephALEXin (KEFLEX) 500 MG capsule Take 1 capsule (500 mg total) by mouth 4 (four) times daily. 05/07/14   Kathie Dike, PA-C  HYDROcodone-acetaminophen (NORCO/VICODIN) 5-325 MG per tablet Take 1 tablet by mouth every 4 (four) hours as needed. 05/07/14   Kathie Dike, PA-C  ibuprofen (ADVIL,MOTRIN) 200 MG tablet Take 800 mg by mouth every 4 (four) hours as needed for pain.    Historical Provider, MD   LMP 06/13/2014 Physical Exam  Nursing note and vitals reviewed. Constitutional: She is oriented to person, place, and time. She appears well-developed and well-nourished. No distress.  HENT:  Head: Normocephalic and atraumatic.  Eyes: EOM are normal.  Neck: Normal range of motion.  Cardiovascular: Normal rate, regular rhythm and normal heart sounds.   Pulmonary/Chest: Effort normal and breath sounds normal.  Abdominal: Soft. She exhibits no distension. There is no tenderness.  Musculoskeletal: Normal range of motion.  Neurological: She is alert and oriented to person, place, and time.  Skin: Skin is warm and dry.  Psychiatric: She has a normal mood and affect. Judgment normal.    ED Course  Procedures (including critical  care time)  DIAGNOSTIC STUDIES: Oxygen Saturation is 100% on RA, normal by my interpretation.    COORDINATION OF CARE: 7:21 PM Will order chest x-ray and lab work. Discussed treatment plan with pt at bedside and pt agreed to plan.   Labs Review Labs Reviewed  BASIC METABOLIC PANEL - Abnormal; Notable for the following:    Glucose, Bld 103 (*)    All other components within normal limits  CBC WITH DIFFERENTIAL - Abnormal; Notable for the following:    WBC 11.2 (*)    Neutrophils Relative % 37 (*)    Lymphocytes Relative 53 (*)    Lymphs Abs 5.9 (*)    All  other components within normal limits  TROPONIN I    Imaging Review Dg Chest Portable 1 View  06/20/2014   CLINICAL DATA:  Intermittent sharp chest pain for 3 weeks. Smoker. Initial encounter.  EXAM: PORTABLE CHEST - 1 VIEW  COMPARISON:  None.  FINDINGS: 1925 hr  The heart size and mediastinal contours are normal. The lungs are clear. There is no pleural effusion or pneumothorax. No acute osseous findings are identified. Telemetry leads overlie the chest.  IMPRESSION: No active cardiopulmonary process.   Electronically Signed   By: Roxy HorsemanBill  Veazey M.D.   On: 06/20/2014 19:39  I personally reviewed the imaging tests through PACS system I reviewed available ER/hospitalization records through the EMR    EKG Interpretation   Date/Time:  Monday June 20 2014 19:07:15 EDT Ventricular Rate:  86 PR Interval:  146 QRS Duration: 87 QT Interval:  358 QTC Calculation: 428 R Axis:   6 Text Interpretation:  Sinus rhythm Low voltage, precordial leads No old  tracing to compare Confirmed by Raguel Kosloski  MD, Caryn BeeKEVIN ([id number]) on 06/20/2014  7:10:36 PM      MDM   Final diagnoses:  Chest pain, unspecified chest pain type   Atypical chest pain.  Doubt gastroesophageal reflux disease.  Patient be placed on Prilosec.  Doubt acute coronary syndrome.  Patient is PERC negative.  DC home.  She understands to return to the ER for new or resting symptoms  I personally performed the services described in this documentation, which was scribed in my presence. The recorded information has been reviewed and is accurate.        Lyanne CoKevin M Bijou Easler, MD 06/20/14 2006

## 2016-01-16 ENCOUNTER — Other Ambulatory Visit (HOSPITAL_COMMUNITY): Payer: Self-pay | Admitting: *Deleted

## 2016-01-16 ENCOUNTER — Other Ambulatory Visit (HOSPITAL_COMMUNITY): Payer: Self-pay | Admitting: Nurse Practitioner

## 2016-01-16 DIAGNOSIS — Z1231 Encounter for screening mammogram for malignant neoplasm of breast: Secondary | ICD-10-CM

## 2016-01-17 ENCOUNTER — Ambulatory Visit (HOSPITAL_COMMUNITY)
Admission: RE | Admit: 2016-01-17 | Discharge: 2016-01-17 | Disposition: A | Discharge status: Home / Self Care | Payer: Medicaid Other | Source: Ambulatory Visit | Attending: *Deleted | Admitting: *Deleted

## 2016-01-17 DIAGNOSIS — Z1231 Encounter for screening mammogram for malignant neoplasm of breast: Secondary | ICD-10-CM | POA: Insufficient documentation

## 2016-01-19 ENCOUNTER — Other Ambulatory Visit: Payer: Self-pay | Admitting: *Deleted

## 2016-01-19 DIAGNOSIS — R928 Other abnormal and inconclusive findings on diagnostic imaging of breast: Secondary | ICD-10-CM

## 2016-01-26 ENCOUNTER — Other Ambulatory Visit: Payer: Medicaid Other

## 2016-01-29 ENCOUNTER — Other Ambulatory Visit: Payer: Self-pay | Admitting: *Deleted

## 2016-01-29 ENCOUNTER — Ambulatory Visit
Admission: RE | Admit: 2016-01-29 | Discharge: 2016-01-29 | Disposition: A | Discharge status: Home / Self Care | Payer: Medicaid Other | Source: Ambulatory Visit | Attending: *Deleted | Admitting: *Deleted

## 2016-01-29 DIAGNOSIS — N6320 Unspecified lump in the left breast, unspecified quadrant: Secondary | ICD-10-CM

## 2016-01-29 DIAGNOSIS — R928 Other abnormal and inconclusive findings on diagnostic imaging of breast: Secondary | ICD-10-CM

## 2016-01-31 ENCOUNTER — Ambulatory Visit
Admission: RE | Admit: 2016-01-31 | Discharge: 2016-01-31 | Disposition: A | Discharge status: Home / Self Care | Payer: Medicaid Other | Source: Ambulatory Visit | Attending: *Deleted | Admitting: *Deleted

## 2016-01-31 ENCOUNTER — Other Ambulatory Visit: Payer: Medicaid Other

## 2016-01-31 DIAGNOSIS — N6320 Unspecified lump in the left breast, unspecified quadrant: Secondary | ICD-10-CM

## 2016-01-31 HISTORY — PX: BREAST BIOPSY: SHX20

## 2016-05-21 ENCOUNTER — Emergency Department (HOSPITAL_COMMUNITY): Payer: Medicaid Other

## 2016-05-21 ENCOUNTER — Emergency Department (HOSPITAL_COMMUNITY)
Admission: EM | Admit: 2016-05-21 | Discharge: 2016-05-21 | Disposition: A | Discharge status: Home / Self Care | Payer: Medicaid Other | Attending: Emergency Medicine | Admitting: Emergency Medicine

## 2016-05-21 ENCOUNTER — Encounter (HOSPITAL_COMMUNITY): Payer: Self-pay | Admitting: Emergency Medicine

## 2016-05-21 DIAGNOSIS — M545 Low back pain, unspecified: Secondary | ICD-10-CM

## 2016-05-21 DIAGNOSIS — F17210 Nicotine dependence, cigarettes, uncomplicated: Secondary | ICD-10-CM | POA: Insufficient documentation

## 2016-05-21 DIAGNOSIS — M5136 Other intervertebral disc degeneration, lumbar region: Secondary | ICD-10-CM | POA: Insufficient documentation

## 2016-05-21 LAB — POC URINE PREG, ED: PREG TEST UR: NEGATIVE

## 2016-05-21 MED ORDER — DEXAMETHASONE 4 MG PO TABS: 4.0000 mg | ORAL_TABLET | Freq: Two times a day (BID) | ORAL | 0 refills | Status: DC

## 2016-05-21 MED ORDER — KETOROLAC TROMETHAMINE 10 MG PO TABS
10.0000 mg | ORAL_TABLET | Freq: Once | ORAL | Status: AC
  Administered 2016-05-21: 10 mg via ORAL
  Filled 2016-05-21: qty 1

## 2016-05-21 MED ORDER — PREDNISONE 20 MG PO TABS
40.0000 mg | ORAL_TABLET | Freq: Once | ORAL | Status: AC
  Administered 2016-05-21: 40 mg via ORAL
  Filled 2016-05-21: qty 2

## 2016-05-21 MED ORDER — DICLOFENAC SODIUM 75 MG PO TBEC
75.0000 mg | DELAYED_RELEASE_TABLET | Freq: Two times a day (BID) | ORAL | 0 refills | Status: DC
Start: 2016-05-21 — End: 2017-01-16

## 2016-05-21 MED ORDER — CHLORZOXAZONE 500 MG PO TABS
500.0000 mg | ORAL_TABLET | Freq: Three times a day (TID) | ORAL | 0 refills | Status: DC
Start: 2016-05-21 — End: 2017-02-21

## 2016-05-21 MED ORDER — ONDANSETRON HCL 4 MG PO TABS
4.0000 mg | ORAL_TABLET | Freq: Once | ORAL | Status: AC
  Administered 2016-05-21: 4 mg via ORAL
  Filled 2016-05-21: qty 1

## 2016-05-21 NOTE — ED Provider Notes (Signed)
[facility] Provider Note   CSN: [id number] Arrival date & time: 05/21/16  [id number]     History   Chief Complaint Chief Complaint  Patient presents with  . Back Pain    HPI Tiffany Morgan is a 45 y.o. female.  The history is provided by the patient.  Back Pain   This is a chronic problem. The problem occurs daily. The problem has been gradually worsening. The pain is associated with twisting and lifting heavy objects. The pain is present in the lumbar spine. The quality of the pain is described as shooting and aching. The pain is moderate. The symptoms are aggravated by bending, twisting and certain positions. The pain is the same all the time. Pertinent negatives include no fever, no numbness, no abdominal pain, no dysuria, no paresthesias, no tingling and no weakness. She has tried NSAIDs and muscle relaxants for the symptoms.    History reviewed. No pertinent past medical history.  Patient Active Problem List   Diagnosis Date Noted  . CLOSED FRACTURE OF LATERAL MALLEOLUS 09/01/2007    Past Surgical History:  Procedure Laterality Date  . CESAREAN SECTION      OB History    No data available       Home Medications    Prior to Admission medications   Medication Sig Start Date End Date Taking? Authorizing Provider  Aspirin-Acetaminophen (GOODYS BODY PAIN PO) Take 1 packet by mouth every 6 (six) hours as needed (pain).    Historical Provider, MD  ibuprofen (ADVIL,MOTRIN) 200 MG tablet Take 800 mg by mouth every 4 (four) hours as needed for pain.    Historical Provider, MD  omeprazole (PRILOSEC) 20 MG capsule Take 1 capsule (20 mg total) by mouth daily. 06/20/14   Azalia BilisKevin Campos, MD    Family History History reviewed. No pertinent family history.  Social History Social History  Substance Use Topics  . Smoking status: Current Every Day Smoker    Packs/day: 0.50    Types: Cigarettes  . Smokeless tobacco: Not on file  . Alcohol use No     Allergies     Review of patient's allergies indicates no known allergies.   Review of Systems Review of Systems  Constitutional: Negative for fever.  Gastrointestinal: Negative for abdominal pain.  Genitourinary: Negative for dysuria.  Musculoskeletal: Positive for back pain.  Neurological: Negative for tingling, weakness, numbness and paresthesias.  All other systems reviewed and are negative.    Physical Exam Updated Vital Signs BP 142/88 (BP Location: Left Arm)   Pulse 101   Temp 98 F (36.7 C) (Oral)   Resp 16   Ht 5\' 7"  (1.702 m)   Wt 77.1 kg   LMP 05/18/2016   SpO2 100%   BMI 26.63 kg/m   Physical Exam  Constitutional: She is oriented to person, place, and time. She appears well-developed and well-nourished.  Non-toxic appearance.  HENT:  Head: Normocephalic.  Right Ear: Tympanic membrane and external ear normal.  Left Ear: Tympanic membrane and external ear normal.  Eyes: EOM and lids are normal. Pupils are equal, round, and reactive to light.  Neck: Normal range of motion. Neck supple. Carotid bruit is not present.  Cardiovascular: Normal rate, regular rhythm, normal heart sounds, intact distal pulses and normal pulses.   Pulmonary/Chest: Breath sounds normal. No respiratory distress.  Abdominal: Soft. Bowel sounds are normal. There is no tenderness. There is no guarding.  Musculoskeletal: Normal range of motion.  Lymphadenopathy:  Head (right side): No submandibular adenopathy present.       Head (left side): No submandibular adenopathy present.    She has no cervical adenopathy.  Neurological: She is alert and oriented to person, place, and time. She has normal strength. No cranial nerve deficit or sensory deficit.  Skin: Skin is warm and dry.  Psychiatric: She has a normal mood and affect. Her speech is normal.  Nursing note and vitals reviewed.    ED Treatments / Results  Labs (all labs ordered are listed, but only abnormal results are displayed) Labs  Reviewed - No data to display  EKG  EKG Interpretation None       Radiology No results found. Results for orders placed or performed during the hospital encounter of 05/21/16  POC Urine Pregnancy, ED (do NOT order at [facility])  Result Value Ref Range   Preg Test, Ur NEGATIVE NEGATIVE   Dg Lumbar Spine Complete  Result Date: 05/21/2016 CLINICAL DATA:  Lumbago, chronic EXAM: LUMBAR SPINE - COMPLETE 4+ VIEW COMPARISON:  None. FINDINGS: Frontal, lateral, spot lumbosacral lateral, and bilateral oblique views were obtained. There are 5 non-rib-bearing lumbar type vertebral bodies. There is no fracture or spondylolisthesis. There is mild disc space narrowing at T12-L1, L1-2, and L2-3. There is no appreciable facet narrowing. There is mild calcification in the distal aorta. IMPRESSION: Mild osteoarthritic change. No fracture or spondylolisthesis. Aortic atherosclerosis noted. Electronically Signed   By: Bretta Bang III M.D.   On: 05/21/2016 10:04   Procedures Procedures (including critical care time)  Medications Ordered in ED Medications - No data to display   Initial Impression / Assessment and Plan / ED Course  I have reviewed the triage vital signs and the nursing notes.  Pertinent labs & imaging results that were available during my care of the patient were reviewed by me and considered in my medical decision making (see chart for details).  Clinical Course    *I have reviewed nursing notes, vital signs, and all appropriate lab and imaging results for this patient.**  Final Clinical Impressions(s) / ED Diagnoses  There is disc space narrowing at the T12-L1, L1-L2 area. No fx or dislocation. Osteoarthritis changes also noted. Discussed findings with the patient in terms she can understand. No evidence for caudal equina or emergent changes. Pt referred to orthopedics. Rx for decadron, diclofenac, and parafon forte given to the patient. She is in agreement with this plan.   Final  diagnoses:  None    New Prescriptions New Prescriptions   No medications on file     Ivery Quale, PA-C 05/23/16 1250    Rolland Porter, MD 06/01/16 [phone]

## 2016-05-21 NOTE — ED Triage Notes (Signed)
Reports lower back pain for several months now.  States light housework and activity exacerbate the pain.  Was put on Flexeril by pcp, but not helping.

## 2016-05-21 NOTE — Discharge Instructions (Signed)
Your vital signs are within normal limits. The x-ray of your back suggest arthritis changes and possible degenerative disc changes of your lumbar area. There is some mild spasm problem on the examination of your lower back. Heating pad to your lower back maybe helpful. Please rest your back is much as possible. Please use Decadron and diclofenac 2 times daily. Use Parafon forte at bed time, or three times daily for spasm.This medication may cause drowsiness. Please do not drink, drive, or participate in activity that requires concentration while taking this medication. Please see Dr Earlie CountsHewett for additional evaluation of your back.

## 2016-08-13 ENCOUNTER — Ambulatory Visit (HOSPITAL_COMMUNITY): Payer: Medicaid Other | Attending: Nurse Practitioner

## 2016-08-13 DIAGNOSIS — M62830 Muscle spasm of back: Secondary | ICD-10-CM

## 2016-08-13 DIAGNOSIS — G8929 Other chronic pain: Secondary | ICD-10-CM | POA: Insufficient documentation

## 2016-08-13 DIAGNOSIS — M545 Low back pain, unspecified: Secondary | ICD-10-CM

## 2016-08-13 DIAGNOSIS — R293 Abnormal posture: Secondary | ICD-10-CM | POA: Insufficient documentation

## 2016-08-13 NOTE — Patient Instructions (Addendum)

## 2016-08-13 NOTE — Therapy (Signed)
Rehab Potential Fair   Clinical Impairments Affecting Rehab Potential Limited coverage by insurance. Visits limitations.    PT Frequency One time visit   PT Treatment/Interventions Dry needling;Passive range of motion;Therapeutic exercise;Therapeutic activities;Patient/family education;Manual techniques   PT Home Exercise Plan Bridge, DKTC, SKTC, Reverse Crunch, Double bent knee raise, Bird Dog, Cat/cow.    Consulted and Agree with Plan of Care Patient      Patient will benefit from skilled therapeutic intervention in order to improve the following deficits and impairments:  Abnormal gait, Decreased range of motion, Obesity, Pain, Increased muscle spasms, Decreased strength, Hypomobility, Postural dysfunction, Impaired flexibility  Visit Diagnosis: Chronic bilateral low back pain without sciatica - Plan: PT plan of care cert/re-cert  Muscle spasm of back - Plan: PT plan of care cert/re-cert  Abnormal posture - Plan: PT plan of care cert/re-cert     Problem List Patient Active Problem List   Diagnosis Date Noted  . CLOSED FRACTURE OF LATERAL MALLEOLUS 09/01/2007    11:16 AM, 08/13/16 Etta Grandchild, PT, DPT Physical Therapist at [facility] [phone] (office)     [facility] [address] [place], [place], [address] Phone: [phone]   Fax:  [phone]  Name: GISSELLE GALVIS MRN: [id number] Date of Birth: 10-07-1970  PHYSICAL THERAPY DISCHARGE SUMMARY  Visits from Start of Care: 1  Current functional level related to goals / functional outcomes: *see below   Remaining deficits: *see below   Education / Equipment: *see below Plan: Patient agrees to discharge.  Patient goals were not met. Patient is being discharged due to                                                     ????? No insurance coverage for this problem.            11:16 AM, 08/13/16 Etta Grandchild, PT, DPT Physical Therapist at [facility] [phone] (office)             [facility] [address] [place], [place], [address] Phone: [phone]   Fax:  [phone]  Physical Therapy Evaluation  Patient Details  Name: Tiffany Morgan MRN: [id number] Date of Birth: 07/30/1971 Referring Provider: Abran Cantor  Encounter Date: 08/13/2016      PT End of Session - 08/13/16 1054    Visit Number 1   Authorization Type Medicaid    Authorization Time Period 08/13/16-08/16/16   Authorization - Visit Number 1   PT Start Time 0947   PT Stop Time 1038   PT Time Calculation (min) 51 min   Activity Tolerance Patient tolerated treatment well   Behavior During Therapy [facility] for tasks assessed/performed      No past medical history on file.  Past Surgical History:  Procedure Laterality Date  . CESAREAN SECTION      There were no vitals filed for this visit.       Subjective Assessment - 08/13/16 0949    Subjective Pt reports her chronic LBP has gotten much worse to the point where it is now interfering with her daily activity.    Pertinent History LBP for six years, worse on the left side, extending from the lumbosacral junction to the T10 level, about 2-3 inches out from the spinous process. Inititally this was painful only at night, but over the last few years it has gotten more painful and more apparent during the day throughout her  house activities. Pt reports with prolonged standing + weight shifting, she can feel a cliking/popping in the low back area.    Limitations Standing;Walking;Sitting   How long can you sit comfortably? 30-60 minutes    How long can you stand comfortably? 15-20 minutes    How long can you walk comfortably? 30 minutes during shopping   Diagnostic tests Xrays done with "DDD"    Patient Stated Goals Be able to perform her daily household activities without havuing to stop or having more pain    Currently in Pain? No/denies   Pain Onset Other (comment)  6+ years   Pain Frequency Several days a week   Aggravating Factors  Sweeping, prolonged sitting, standing walking.     Pain Relieving Factors 2 weeks relief from voltarn generic, frequent heating pad use, activity limitations.    Effect of Pain on Daily Activities has to take frequent rest breaks, loss of sleep             [facility] PT Assessment - 08/13/16 0001      Assessment   Medical Diagnosis Progressive low back pain  Rehab Potential Fair   Clinical Impairments Affecting Rehab Potential Limited coverage by insurance. Visits limitations.    PT Frequency One time visit   PT Treatment/Interventions Dry needling;Passive range of motion;Therapeutic exercise;Therapeutic activities;Patient/family education;Manual techniques   PT Home Exercise Plan Bridge, DKTC, SKTC, Reverse Crunch, Double bent knee raise, Bird Dog, Cat/cow.    Consulted and Agree with Plan of Care Patient      Patient will benefit from skilled therapeutic intervention in order to improve the following deficits and impairments:  Abnormal gait, Decreased range of motion, Obesity, Pain, Increased muscle spasms, Decreased strength, Hypomobility, Postural dysfunction, Impaired flexibility  Visit Diagnosis: Chronic bilateral low back pain without sciatica - Plan: PT plan of care cert/re-cert  Muscle spasm of back - Plan: PT plan of care cert/re-cert  Abnormal posture - Plan: PT plan of care cert/re-cert     Problem List Patient Active Problem List   Diagnosis Date Noted  . CLOSED FRACTURE OF LATERAL MALLEOLUS 09/01/2007    11:16 AM, 08/13/16 Etta Grandchild, PT, DPT Physical Therapist at [facility] [phone] (office)     [facility] [address] [place], [place], [address] Phone: [phone]   Fax:  [phone]  Name: GISSELLE GALVIS MRN: [id number] Date of Birth: 10-07-1970

## 2017-01-01 ENCOUNTER — Other Ambulatory Visit: Payer: Self-pay | Admitting: *Deleted

## 2017-01-01 DIAGNOSIS — Z1231 Encounter for screening mammogram for malignant neoplasm of breast: Secondary | ICD-10-CM

## 2017-01-16 ENCOUNTER — Ambulatory Visit (INDEPENDENT_AMBULATORY_CARE_PROVIDER_SITE_OTHER): Payer: Medicaid Other | Admitting: Family Medicine

## 2017-01-16 ENCOUNTER — Encounter: Payer: Self-pay | Admitting: Family Medicine

## 2017-01-16 VITALS — BP 138/84 | HR 88 | Temp 98.4°F | Resp 16 | Ht 67.0 in | Wt 181.1 lb

## 2017-01-16 DIAGNOSIS — M545 Low back pain: Secondary | ICD-10-CM

## 2017-01-16 DIAGNOSIS — G8929 Other chronic pain: Secondary | ICD-10-CM | POA: Insufficient documentation

## 2017-01-16 DIAGNOSIS — E663 Overweight: Secondary | ICD-10-CM | POA: Diagnosis not present

## 2017-01-16 DIAGNOSIS — Z7689 Persons encountering health services in other specified circumstances: Secondary | ICD-10-CM

## 2017-01-16 DIAGNOSIS — I700 Atherosclerosis of aorta: Secondary | ICD-10-CM

## 2017-01-16 DIAGNOSIS — M549 Dorsalgia, unspecified: Secondary | ICD-10-CM

## 2017-01-16 DIAGNOSIS — Z720 Tobacco use: Secondary | ICD-10-CM

## 2017-01-16 DIAGNOSIS — M479 Spondylosis, unspecified: Secondary | ICD-10-CM | POA: Insufficient documentation

## 2017-01-16 DIAGNOSIS — M47815 Spondylosis without myelopathy or radiculopathy, thoracolumbar region: Secondary | ICD-10-CM

## 2017-01-16 DIAGNOSIS — F4011 Social phobia, generalized: Secondary | ICD-10-CM

## 2017-01-16 MED ORDER — DULOXETINE HCL 30 MG PO CPEP: 30.0000 mg | ORAL_CAPSULE | Freq: Every day | ORAL | 3 refills | Status: DC

## 2017-01-16 NOTE — Progress Notes (Signed)
Chief Complaint  Patient presents with  . Establish Care   New to establish Old records at health department Pap and mammogram up to date Flu shot this year unknown tetanus I have discussed the multiple health risks associated with cigarette smoking including, but not limited to, cardiovascular disease, lung disease and cancer.  I have strongly recommended that smoking be stopped.  I have reviewed the various methods of quitting including cold [place], classes, nicotine replacements and prescription medications.  I have offered assistance in this difficult process.  The patient is not interested in assistance at this time.  She especially is reminded it worsens DJD of spine and hinders treatment options.  She is shown the aortic atherosclerosis on her x rays. She has chronic back pain.  Is seeing a specialist tomorrow Has anxiety.  Previously took paxil.  Is anxious going out of house to events.   Patient Active Problem List   Diagnosis Date Noted  . Tobacco abuse 01/16/2017  . Atherosclerosis of aorta (HCC) 01/16/2017  . Degenerative arthritis of spine 01/16/2017  . Chronic midline back pain 01/16/2017  . Generalized social phobia 01/16/2017  . Overweight 01/16/2017    Outpatient Encounter Prescriptions as of 01/16/2017  Medication Sig  . chlorzoxazone (PARAFON FORTE DSC) 500 MG tablet Take 1 tablet (500 mg total) by mouth 3 (three) times daily.  . DULoxetine (CYMBALTA) 30 MG capsule Take 1 capsule (30 mg total) by mouth daily.  Marland Kitchen. ibuprofen (ADVIL,MOTRIN) 800 MG tablet Take 800 mg by mouth every 8 (eight) hours as needed.  . norethindrone (MICRONOR,CAMILA,ERRIN) 0.35 MG tablet Take 1 tablet by mouth daily.   No facility-administered encounter medications on file as of 01/16/2017.     Past Medical History:  Diagnosis Date  . Anxiety   . Arthritis    spine  . Depression     Past Surgical History:  Procedure Laterality Date  . CESAREAN SECTION     3  . TUBAL LIGATION       Social History   Social History  . Marital status: Single    Spouse name: N/A  . Number of children: 2  . Years of education: 12   Occupational History  . un     private care   Social History Main Topics  . Smoking status: Current Every Day Smoker    Packs/day: 0.50    Types: Cigarettes    Start date: 09/17/1995  . Smokeless tobacco: Never Used     Comment: trying to quit  . Alcohol use No  . Drug use: No  . Sexual activity: Not Currently    Birth control/ protection: Surgical   Other Topics Concern  . Not on file   Social History Narrative   Single   Lives with [name] Suzzanne CloudLea   Feels unable to work due to back pain    Family History  Problem Relation Age of Onset  . Hypertension Mother   . Depression Mother   . Hyperlipidemia Mother   . Heart disease Mother   . Cancer Father     pancreatic  . Mental illness Sister     schizoid  . Diabetes Sister   . Cancer Maternal Grandmother     bone marrow  . Alcohol abuse Maternal Grandfather     Review of Systems  Constitutional: Positive for malaise/fatigue. Negative for chills, fever and weight loss.  HENT: Negative for congestion and hearing loss.   Eyes: Negative for blurred vision and pain.  Respiratory: Negative  for cough and shortness of breath.   Cardiovascular: Negative for chest pain and leg swelling.  Gastrointestinal: Negative for abdominal pain, constipation, diarrhea and heartburn.  Genitourinary: Negative for dysuria and frequency.  Musculoskeletal: Positive for back pain. Negative for falls, joint pain and myalgias.  Neurological: Negative for dizziness, seizures and headaches.  Psychiatric/Behavioral: Negative for depression. The patient is nervous/anxious. The patient does not have insomnia.    BP 138/84 (BP Location: Right Arm, Patient Position: Sitting, Cuff Size: Normal)   Pulse 88   Temp 98.4 F (36.9 C) (Temporal)   Resp 16   Ht 5\' 7"  (1.702 m)   Wt 181 lb 1.9 oz (82.2 kg)   LMP  01/14/2017 (Exact Date)   SpO2 98%   BMI 28.37 kg/m   Physical Exam  Constitutional: She is oriented to person, place, and time. She appears well-developed and well-nourished.  Overweight.  Mildly uncomfortable  HENT:  Head: Normocephalic and atraumatic.  Mouth/Throat: Oropharynx is clear and moist.  Eyes: Conjunctivae are normal. Pupils are equal, round, and reactive to light.  Neck: Normal range of motion. Neck supple. No thyromegaly present.  Cardiovascular: Normal rate, regular rhythm and normal heart sounds.   Pulmonary/Chest: Effort normal and breath sounds normal. No respiratory distress.  Abdominal: Soft. Bowel sounds are normal.  Musculoskeletal: Normal range of motion. She exhibits no edema.  Lumbar spine is straight and symmetric. Full range of motion. No muscle spasm. Strength, sensation, range of motion, and reflexes are normal in both lower extremities. Straight leg raise is negative bilateral. Mild tenderness to palpation L5S1 area and upper sacrum centrally   Lymphadenopathy:    She has no cervical adenopathy.  Neurological: She is alert and oriented to person, place, and time. She displays normal reflexes.  Gait normal  Skin: Skin is warm and dry.  Psychiatric: Her behavior is normal. Thought content normal.  labile  Nursing note and vitals reviewed.  ASSESSMENT/PLAN:  1. Encounter to establish care with new doctor Need old records  2. Tobacco abuse counseled  3. Atherosclerosis of aorta (HCC)  - CBC - COMPLETE METABOLIC PANEL WITH GFR - Lipid panel - VITAMIN D 25 Hydroxy (Vit-D Deficiency, Fractures) - Urinalysis, Routine w reflex microscopic - TSH  4. Osteoarthritis of thoracolumbar spine, unspecified spinal osteoarthritis complication status Shown to patient on films.  Suspect mechanical back pain.  Sees ortho tomorrow  5. Generalized anxiety Start duloxetine.  Discussed SSRI mechanism, wait for effectiveness, side effects  6. Chronic midline low  back pain without sciatica   7. Overweight Counseled regarding reducing carbs/calories/portions   Patient Instructions  Try to stop smoking Walk every day that you are able  You are due for lab testing I need records from health department  Start duloxetine one a day Call if it causes you any side effect  Need blood testing before you come back See me in one month   Eustace Moore, MD

## 2017-01-16 NOTE — Patient Instructions (Addendum)
Try to stop smoking Walk every day that you are able  You are due for lab testing I need records from health department  Start duloxetine one a day Call if it causes you any side effect  Need blood testing before you come back See me in one month

## 2017-02-04 ENCOUNTER — Ambulatory Visit: Payer: Medicaid Other

## 2017-02-17 ENCOUNTER — Ambulatory Visit
Admission: RE | Admit: 2017-02-17 | Discharge: 2017-02-17 | Disposition: A | Discharge status: Home / Self Care | Payer: Medicaid Other | Source: Ambulatory Visit | Attending: *Deleted | Admitting: *Deleted

## 2017-02-17 DIAGNOSIS — Z1231 Encounter for screening mammogram for malignant neoplasm of breast: Secondary | ICD-10-CM

## 2017-02-18 ENCOUNTER — Ambulatory Visit: Payer: Medicaid Other | Admitting: Family Medicine

## 2017-02-19 LAB — COMPLETE METABOLIC PANEL WITH GFR
ALK PHOS: 97 U/L (ref 33–115)
ALT: 8 U/L (ref 6–29)
AST: 10 U/L (ref 10–35)
Albumin: 3.8 g/dL (ref 3.6–5.1)
BUN: 6 mg/dL — AB (ref 7–25)
CO2: 23 mmol/L (ref 20–31)
CREATININE: 0.78 mg/dL (ref 0.50–1.10)
Calcium: 9.1 mg/dL (ref 8.6–10.2)
Chloride: 110 mmol/L (ref 98–110)
GFR, Est African American: >89 mL/min (ref >=60)
Glucose, Bld: 116 mg/dL — ABNORMAL HIGH (ref 65–99)
POTASSIUM: 4 mmol/L (ref 3.5–5.3)
Sodium: 140 mmol/L (ref 135–146)
TOTAL PROTEIN: 6.4 g/dL (ref 6.1–8.1)
Total Bilirubin: 0.3 mg/dL (ref 0.2–1.2)

## 2017-02-19 LAB — CBC
HCT: 41 % (ref 35.0–45.0)
Hemoglobin: 13.9 g/dL (ref 11.7–15.5)
MCH: 30.9 pg (ref 27.0–33.0)
MCHC: 33.9 g/dL (ref 32.0–36.0)
MCV: 91.1 fL (ref 80.0–100.0)
MPV: 9.4 fL (ref 7.5–12.5)
PLATELETS: 380 10*3/uL (ref 140–400)
RBC: 4.5 MIL/uL (ref 3.80–5.10)
RDW: 15.6 % — ABNORMAL HIGH (ref 11.0–15.0)
WBC: 11.1 10*3/uL — ABNORMAL HIGH (ref 3.8–10.8)

## 2017-02-19 LAB — URINALYSIS, ROUTINE W REFLEX MICROSCOPIC
Bilirubin Urine: NEGATIVE
Glucose, UA: NEGATIVE
Hgb urine dipstick: NEGATIVE
Ketones, ur: NEGATIVE
Leukocytes, UA: NEGATIVE
Nitrite: NEGATIVE
Protein, ur: NEGATIVE
Specific Gravity, Urine: 1.007 (ref 1.001–1.035)
pH: 6.5 (ref 5.0–8.0)

## 2017-02-19 LAB — LIPID PANEL
Cholesterol: 216 mg/dL — ABNORMAL HIGH (ref <200)
HDL: 32 mg/dL — ABNORMAL LOW (ref >50)
LDL Cholesterol: 148 mg/dL — ABNORMAL HIGH (ref <100)
TRIGLYCERIDES: 179 mg/dL — AB (ref <150)
Total CHOL/HDL Ratio: 6.8 Ratio — ABNORMAL HIGH (ref <5.0)
VLDL: 36 mg/dL — ABNORMAL HIGH (ref <30)

## 2017-02-19 LAB — TSH: TSH: 0.89 mIU/L

## 2017-02-20 ENCOUNTER — Encounter: Payer: Self-pay | Admitting: Family Medicine

## 2017-02-20 LAB — VITAMIN D 25 HYDROXY (VIT D DEFICIENCY, FRACTURES): Vit D, 25-Hydroxy: 10 ng/mL — ABNORMAL LOW (ref 30–100)

## 2017-02-21 ENCOUNTER — Ambulatory Visit (INDEPENDENT_AMBULATORY_CARE_PROVIDER_SITE_OTHER): Payer: Medicaid Other | Admitting: Family Medicine

## 2017-02-21 ENCOUNTER — Encounter: Payer: Self-pay | Admitting: Family Medicine

## 2017-02-21 VITALS — BP 122/76 | HR 92 | Temp 98.6°F | Resp 18 | Ht 68.0 in | Wt 179.1 lb

## 2017-02-21 DIAGNOSIS — M47815 Spondylosis without myelopathy or radiculopathy, thoracolumbar region: Secondary | ICD-10-CM | POA: Diagnosis not present

## 2017-02-21 DIAGNOSIS — E559 Vitamin D deficiency, unspecified: Secondary | ICD-10-CM | POA: Insufficient documentation

## 2017-02-21 DIAGNOSIS — Z720 Tobacco use: Secondary | ICD-10-CM

## 2017-02-21 MED ORDER — VITAMIN D (ERGOCALCIFEROL) 1.25 MG (50000 UNIT) PO CAPS: 50000.0000 [IU] | ORAL_CAPSULE | ORAL | 0 refills | Status: DC

## 2017-02-21 MED ORDER — ETODOLAC 400 MG PO TABS: 400.0000 mg | ORAL_TABLET | Freq: Two times a day (BID) | ORAL | 1 refills | Status: DC

## 2017-02-21 MED ORDER — DULOXETINE HCL 30 MG PO CPEP: 30.0000 mg | ORAL_CAPSULE | Freq: Two times a day (BID) | ORAL | 1 refills | Status: DC

## 2017-02-21 MED ORDER — TIZANIDINE HCL 4 MG PO CAPS: 4.0000 mg | ORAL_CAPSULE | Freq: Three times a day (TID) | ORAL | 1 refills | Status: DC

## 2017-02-21 MED ORDER — GABAPENTIN 300 MG PO CAPS: 300.0000 mg | ORAL_CAPSULE | Freq: Three times a day (TID) | ORAL | 3 refills | Status: DC

## 2017-02-21 NOTE — Progress Notes (Signed)
Chief Complaint  Patient presents with  . Follow-up    1 month  routine follow up Labs discussed Vit D deficient.  Discussed diet, supplements, sunshine Hyperlipidemia.  Discussed cholesterol foods, diet, exercise Still with daily back pain.  Saw the spine specialist and has 'bulging discs".  Is disappointed that they only offered her home exercises, Ibuprofen, prn follow up.  She continues to have constant central low back pain with no radiation.  Uses muscle rubs, heat, ibuprofen and parafon forte.  No improvement.  Tries to walk and stretch every day.   I started Cymbalta last visit.  She sees no improvement in mood. Will try adjusting medicines.  More time.  More walking. I will get records and try to advise.    Patient Active Problem List   Diagnosis Date Noted  . Vitamin D deficiency 02/21/2017  . Tobacco abuse 01/16/2017  . Atherosclerosis of aorta (HCC) 01/16/2017  . Degenerative arthritis of spine 01/16/2017  . Chronic midline back pain 01/16/2017  . Generalized social phobia 01/16/2017  . Overweight 01/16/2017    Outpatient Encounter Prescriptions as of 02/21/2017  Medication Sig  . DULoxetine (CYMBALTA) 30 MG capsule Take 1 capsule (30 mg total) by mouth 2 (two) times daily.  . norethindrone (MICRONOR,CAMILA,ERRIN) 0.35 MG tablet Take 1 tablet by mouth daily.  . [DISCONTINUED] chlorzoxazone (PARAFON FORTE DSC) 500 MG tablet Take 1 tablet (500 mg total) by mouth 3 (three) times daily.  . [DISCONTINUED] DULoxetine (CYMBALTA) 30 MG capsule Take 1 capsule (30 mg total) by mouth daily.  . [DISCONTINUED] ibuprofen (ADVIL,MOTRIN) 800 MG tablet Take 800 mg by mouth every 8 (eight) hours as needed.  . etodolac (LODINE) 400 MG tablet Take 1 tablet (400 mg total) by mouth 2 (two) times daily.  Marland Kitchen gabapentin (NEURONTIN) 300 MG capsule Take 1 capsule (300 mg total) by mouth 3 (three) times daily.  Marland Kitchen tiZANidine (ZANAFLEX) 4 MG capsule Take 1 capsule (4 mg total) by mouth 3 (three)  times daily.  . Vitamin D, Ergocalciferol, (DRISDOL) 50000 units CAPS capsule Take 1 capsule (50,000 Units total) by mouth every 7 (seven) days.   No facility-administered encounter medications on file as of 02/21/2017.     No Known Allergies  Review of Systems  Constitutional: Negative for activity change, appetite change and unexpected weight change.  HENT: Negative for congestion, dental problem, postnasal drip and rhinorrhea.   Eyes: Negative for redness and visual disturbance.  Respiratory: Negative for cough and shortness of breath.   Cardiovascular: Negative for chest pain, palpitations and leg swelling.  Gastrointestinal: Negative for abdominal pain, constipation and diarrhea.  Genitourinary: Negative for difficulty urinating, frequency and menstrual problem.  Musculoskeletal: Positive for back pain. Negative for arthralgias.  Neurological: Negative for dizziness and headaches.  Psychiatric/Behavioral: Positive for sleep disturbance. Negative for dysphoric mood. The patient is nervous/anxious.     BP 122/76 (BP Location: Right Arm, Patient Position: Sitting, Cuff Size: Normal)   Pulse 92   Temp 98.6 F (37 C) (Temporal)   Resp 18   Ht 5\' 8"  (1.727 m)   Wt 179 lb 1.3 oz (81.2 kg)   LMP 01/31/2017   SpO2 98%   BMI 27.23 kg/m   Physical Exam  Constitutional: She is oriented to person, place, and time. She appears well-developed and well-nourished.  Overweight.  Mildly uncomfortable  HENT:  Head: Normocephalic and atraumatic.  Mouth/Throat: Oropharynx is clear and moist.  Eyes: Conjunctivae are normal. Pupils are equal, round, and reactive to  light.  Neck: Normal range of motion. Neck supple. No thyromegaly present.  Cardiovascular: Normal rate, regular rhythm and normal heart sounds.   Pulmonary/Chest: Effort normal and breath sounds normal. No respiratory distress.  Abdominal: Soft. Bowel sounds are normal.  Musculoskeletal: Normal range of motion. She exhibits no  edema.  Lumbar spine is straight and symmetric. Full but slow range of motion. No muscle spasm. Strength, sensation, range of motion, and reflexes are normal in both lower extremities. Straight leg raise is negative bilateral. Mild tenderness to palpation L5S1 area and upper sacrum centrally  Lymphadenopathy:    She has no cervical adenopathy.  Neurological: She is alert and oriented to person, place, and time. She displays normal reflexes.  Guarded movements  Skin: Skin is warm and dry.  Psychiatric: Her behavior is normal. Thought content normal.  labile    ASSESSMENT/PLAN:  1. Osteoarthritis of thoracolumbar spine, unspecified spinal osteoarthritis complication status Chronic back pain  2. Vitamin D deficiency Vit D 50000 u a week  3. Tobacco abuse Reminded to quit   Patient Instructions  Double the cymbalta ( duloxetine) to 60 mg a day  Take Lodine ( etodolac ) twice a day No ibuprofen while on the lodine  Try a different muscle relaxer Tizanidine ( zanaflex ) as needed  Take the gabapentin at night This is for nerve pain If tolerated ( can make sleepy) may take 2 - 3 times a day  Walk every day that you are able  I will get back specialist and MRI results  See be mack in 4-6 weeks    Eustace MooreYvonne Sue Karrin Eisenmenger, MD

## 2017-02-21 NOTE — Patient Instructions (Addendum)
Double the cymbalta ( duloxetine) to 60 mg a day  Take Lodine ( etodolac ) twice a day No ibuprofen while on the lodine  Try a different muscle relaxer Tizanidine ( zanaflex ) as needed  Take the gabapentin at night This is for nerve pain If tolerated ( can make sleepy) may take 2 - 3 times a day  Walk every day that you are able  I will get back specialist and MRI results  See be mack in 4-6 weeks

## 2017-03-21 ENCOUNTER — Ambulatory Visit (INDEPENDENT_AMBULATORY_CARE_PROVIDER_SITE_OTHER): Payer: Medicaid Other | Admitting: Family Medicine

## 2017-03-21 ENCOUNTER — Encounter: Payer: Self-pay | Admitting: Family Medicine

## 2017-03-21 VITALS — BP 112/76 | HR 80 | Temp 98.8°F | Resp 16 | Ht 68.0 in | Wt 180.1 lb

## 2017-03-21 DIAGNOSIS — F4011 Social phobia, generalized: Secondary | ICD-10-CM

## 2017-03-21 MED ORDER — CICLOPIROX 8 % EX SOLN: Freq: Every day | CUTANEOUS | 0 refills | Status: DC

## 2017-03-21 NOTE — Progress Notes (Signed)
Chief Complaint  Patient presents with  . Follow-up    5 week  routine follow up "too upset about personal  Life to worry about my back right now" Sister with mental illness has been challenging She had a disability evaluation.  It included a psychologist eval.  She wants referral for counseling.   Back continues painful every day  weight is stable Still smoking - more due to stress.  It is again recommended to her to quit Worried about thickened toenail on both feet  Patient Active Problem List   Diagnosis Date Noted  . Vitamin D deficiency 02/21/2017  . Tobacco abuse 01/16/2017  . Atherosclerosis of aorta (HCC) 01/16/2017  . Degenerative arthritis of spine 01/16/2017  . Chronic midline back pain 01/16/2017  . Generalized social phobia 01/16/2017  . Overweight 01/16/2017    Outpatient Encounter Prescriptions as of 03/21/2017  Medication Sig  . DULoxetine (CYMBALTA) 30 MG capsule Take 1 capsule (30 mg total) by mouth 2 (two) times daily.  Marland Kitchen etodolac (LODINE) 400 MG tablet Take 1 tablet (400 mg total) by mouth 2 (two) times daily.  Marland Kitchen gabapentin (NEURONTIN) 300 MG capsule Take 1 capsule (300 mg total) by mouth 3 (three) times daily.  . norethindrone (MICRONOR,CAMILA,ERRIN) 0.35 MG tablet Take 1 tablet by mouth daily.  Marland Kitchen tiZANidine (ZANAFLEX) 4 MG capsule Take 1 capsule (4 mg total) by mouth 3 (three) times daily.  . Vitamin D, Ergocalciferol, (DRISDOL) 50000 units CAPS capsule Take 1 capsule (50,000 Units total) by mouth every 7 (seven) days.  . ciclopirox (PENLAC) 8 % solution Apply topically at bedtime. Apply over nail and surrounding skin. Apply daily over previous coat. After seven (7) days, may remove with alcohol and continue cycle.   No facility-administered encounter medications on file as of 03/21/2017.     No Known Allergies  Review of Systems  Constitutional: Negative for activity change, appetite change and unexpected weight change.  HENT: Negative for congestion,  dental problem, postnasal drip and rhinorrhea.   Eyes: Negative for redness and visual disturbance.  Respiratory: Negative for cough and shortness of breath.   Cardiovascular: Negative for chest pain, palpitations and leg swelling.  Gastrointestinal: Negative for abdominal pain, constipation and diarrhea.  Genitourinary: Negative for difficulty urinating, frequency and menstrual problem.  Musculoskeletal: Positive for back pain. Negative for arthralgias.  Neurological: Negative for dizziness and headaches.  Psychiatric/Behavioral: Positive for sleep disturbance. Negative for dysphoric mood. The patient is nervous/anxious.     BP 112/76 (BP Location: Right Arm, Patient Position: Sitting, Cuff Size: Normal)   Pulse 80   Temp 98.8 F (37.1 C) (Temporal)   Resp 16   Ht 5\' 8"  (1.727 m)   Wt 180 lb 1.9 oz (81.7 kg)   LMP 03/05/2017   SpO2 98%   BMI 27.39 kg/m   Physical Exam  Constitutional: She is oriented to person, place, and time. She appears well-developed and well-nourished. No distress.  No apparent discomfort  HENT:  Head: Normocephalic and atraumatic.  Mouth/Throat: Oropharynx is clear and moist.  Eyes: Conjunctivae are normal. Pupils are equal, round, and reactive to light.  Cardiovascular: Normal rate, regular rhythm and normal heart sounds.   Pulmonary/Chest: Effort normal and breath sounds normal.  Neurological: She is alert and oriented to person, place, and time.  Psychiatric: She has a normal mood and affect. Her behavior is normal. Thought content normal.    ASSESSMENT/PLAN:  1. Generalized social phobia  - Ambulatory referral to Psychiatry  Patient Instructions  Need records from spine consultation and the MRI Walk every day that you are able  Call for medicine refills  See me in 3 months  Call sooner for probmens       Eustace MooreYvonne Sue Massimiliano Rohleder, MD

## 2017-03-21 NOTE — Patient Instructions (Addendum)
Need records from spine consultation and the MRI Walk every day that you are able  Call for medicine refills  See me in 3 months  Call sooner for probmens

## 2017-04-02 ENCOUNTER — Encounter: Payer: Self-pay | Admitting: Family Medicine

## 2017-04-02 DIAGNOSIS — F419 Anxiety disorder, unspecified: Secondary | ICD-10-CM | POA: Insufficient documentation

## 2017-04-02 DIAGNOSIS — F411 Generalized anxiety disorder: Secondary | ICD-10-CM | POA: Insufficient documentation

## 2017-04-02 DIAGNOSIS — F329 Major depressive disorder, single episode, unspecified: Secondary | ICD-10-CM | POA: Insufficient documentation

## 2017-04-02 DIAGNOSIS — F410 Panic disorder [episodic paroxysmal anxiety] without agoraphobia: Secondary | ICD-10-CM | POA: Insufficient documentation

## 2017-04-23 ENCOUNTER — Telehealth: Payer: Self-pay | Admitting: *Deleted

## 2017-04-23 DIAGNOSIS — F411 Generalized anxiety disorder: Secondary | ICD-10-CM

## 2017-04-23 NOTE — Telephone Encounter (Signed)
Patient called stating she lives in an apartment and the landlord over the apartments does not allow pets unless a 250$ fee is paid, per the patient she does not have the 250$ to pay the pet fee and she does not want to give her dog to the shelter. Patient states she has anxiety and wants to know if there is some type of letter Dr Delton SeeNelson can write for her to give her landlord so she can keep her dog. Please advise   [phone]

## 2017-04-24 NOTE — Telephone Encounter (Signed)
Called kim, aware. Referral to psych in system.

## 2017-04-24 NOTE — Telephone Encounter (Signed)
She was advised to seek psychiatric care at last visit.  I cannot help her with this.

## 2017-05-03 ENCOUNTER — Other Ambulatory Visit: Payer: Self-pay | Admitting: Family Medicine

## 2017-05-05 NOTE — Telephone Encounter (Signed)
Seen [phone] 

## 2017-05-06 ENCOUNTER — Other Ambulatory Visit: Payer: Self-pay | Admitting: Family Medicine

## 2017-06-04 ENCOUNTER — Telehealth: Payer: Self-pay | Admitting: Family Medicine

## 2017-06-04 NOTE — Telephone Encounter (Signed)
Patient called in because she says she has been having migraines for 1 month now, she has checker her blood pressure and its been high the past 2 days (135/96 am yesterday, 145/95 pm yesterday, and 132/82 this morning.)  Cb#[phone]

## 2017-06-04 NOTE — Telephone Encounter (Signed)
Needs to be seen

## 2017-06-05 ENCOUNTER — Other Ambulatory Visit: Payer: Self-pay

## 2017-06-05 ENCOUNTER — Encounter: Payer: Self-pay | Admitting: Family Medicine

## 2017-06-05 ENCOUNTER — Ambulatory Visit (INDEPENDENT_AMBULATORY_CARE_PROVIDER_SITE_OTHER): Payer: Medicaid Other | Admitting: Family Medicine

## 2017-06-05 VITALS — BP 144/88 | HR 102 | Temp 98.3°F | Resp 16 | Ht 68.0 in | Wt 183.0 lb

## 2017-06-05 DIAGNOSIS — G44219 Episodic tension-type headache, not intractable: Secondary | ICD-10-CM | POA: Diagnosis not present

## 2017-06-05 DIAGNOSIS — R030 Elevated blood-pressure reading, without diagnosis of hypertension: Secondary | ICD-10-CM

## 2017-06-05 DIAGNOSIS — Z23 Encounter for immunization: Secondary | ICD-10-CM

## 2017-06-05 NOTE — Patient Instructions (Addendum)
Drink more water Walk every day DASH diet - low salt Need labs Need x ray See me in 1-2 weeks   DASH Eating Plan DASH stands for "Dietary Approaches to Stop Hypertension." The DASH eating plan is a healthy eating plan that has been shown to reduce high blood pressure (hypertension). It may also reduce your risk for type 2 diabetes, heart disease, and stroke. The DASH eating plan may also help with weight loss. What are tips for following this plan? General guidelines  Avoid eating more than 2,300 mg (milligrams) of salt (sodium) a day. If you have hypertension, you may need to reduce your sodium intake to 1,500 mg a day.  Limit alcohol intake to no more than 1 drink a day for nonpregnant women and 2 drinks a day for men. One drink equals 12 oz of beer, 5 oz of wine, or 1 oz of hard liquor.  Work with your health care provider to maintain a healthy body weight or to lose weight. Ask what an ideal weight is for you.  Get at least 30 minutes of exercise that causes your heart to beat faster (aerobic exercise) most days of the week. Activities may include walking, swimming, or biking.  Work with your health care provider or diet and nutrition specialist (dietitian) to adjust your eating plan to your individual calorie needs. Reading food labels  Check food labels for the amount of sodium per serving. Choose foods with less than 5 percent of the Daily Value of sodium. Generally, foods with less than 300 mg of sodium per serving fit into this eating plan.  To find whole grains, look for the word "whole" as the first word in the ingredient list. Shopping  Buy products labeled as "low-sodium" or "no salt added."  Buy fresh foods. Avoid canned foods and premade or frozen meals. Cooking  Avoid adding salt when cooking. Use salt-free seasonings or herbs instead of table salt or sea salt. Check with your health care provider or pharmacist before using salt substitutes.  Do not fry foods.  Cook foods using healthy methods such as baking, boiling, grilling, and broiling instead.  Cook with heart-healthy oils, such as olive, canola, soybean, or sunflower oil. Meal planning   Eat a balanced diet that includes: ? 5 or more servings of fruits and vegetables each day. At each meal, try to fill half of your plate with fruits and vegetables. ? Up to 6-8 servings of whole grains each day. ? Less than 6 oz of lean meat, poultry, or fish each day. A 3-oz serving of meat is about the same size as a deck of cards. One egg equals 1 oz. ? 2 servings of low-fat dairy each day. ? A serving of nuts, seeds, or beans 5 times each week. ? Heart-healthy fats. Healthy fats called Omega-3 fatty acids are found in foods such as flaxseeds and coldwater fish, like sardines, salmon, and mackerel.  Limit how much you eat of the following: ? Canned or prepackaged foods. ? Food that is high in trans fat, such as fried foods. ? Food that is high in saturated fat, such as fatty meat. ? Sweets, desserts, sugary drinks, and other foods with added sugar. ? Full-fat dairy products.  Do not salt foods before eating.  Try to eat at least 2 vegetarian meals each week.  Eat more home-cooked food and less restaurant, buffet, and fast food.  When eating at a restaurant, ask that your food be prepared with less salt or  no salt, if possible. What foods are recommended? The items listed may not be a complete list. Talk with your dietitian about what dietary choices are best for you. Grains Whole-grain or whole-wheat bread. Whole-grain or whole-wheat pasta. Brown rice. [place] Morrow. Bulgur. Whole-grain and low-sodium cereals. Pita bread. Low-fat, low-sodium crackers. Whole-wheat flour tortillas. Vegetables Fresh or frozen vegetables (raw, steamed, roasted, or grilled). Low-sodium or reduced-sodium tomato and vegetable juice. Low-sodium or reduced-sodium tomato sauce and tomato paste. Low-sodium or reduced-sodium  canned vegetables. Fruits All fresh, dried, or frozen fruit. Canned fruit in natural juice (without added sugar). Meat and other protein foods Skinless chicken or [place]. Ground chicken or [place]. Pork with fat trimmed off. Fish and seafood. Egg whites. Dried beans, peas, or lentils. Unsalted nuts, nut butters, and seeds. Unsalted canned beans. Lean cuts of beef with fat trimmed off. Low-sodium, lean deli meat. Dairy Low-fat (1%) or fat-free (skim) milk. Fat-free, low-fat, or reduced-fat cheeses. Nonfat, low-sodium ricotta or cottage cheese. Low-fat or nonfat yogurt. Low-fat, low-sodium cheese. Fats and oils Soft margarine without trans fats. Vegetable oil. Low-fat, reduced-fat, or light mayonnaise and salad dressings (reduced-sodium). Canola, safflower, olive, soybean, and sunflower oils. Avocado. Seasoning and other foods Herbs. Spices. Seasoning mixes without salt. Unsalted popcorn and pretzels. Fat-free sweets. What foods are not recommended? The items listed may not be a complete list. Talk with your dietitian about what dietary choices are best for you. Grains Baked goods made with fat, such as croissants, muffins, or some breads. Dry pasta or rice meal packs. Vegetables Creamed or fried vegetables. Vegetables in a cheese sauce. Regular canned vegetables (not low-sodium or reduced-sodium). Regular canned tomato sauce and paste (not low-sodium or reduced-sodium). Regular tomato and vegetable juice (not low-sodium or reduced-sodium). Angie Fava. Olives. Fruits Canned fruit in a light or heavy syrup. Fried fruit. Fruit in cream or butter sauce. Meat and other protein foods Fatty cuts of meat. Ribs. Fried meat. Berniece Salines. Sausage. Bologna and other processed lunch meats. Salami. Fatback. Hotdogs. Bratwurst. Salted nuts and seeds. Canned beans with added salt. Canned or smoked fish. Whole eggs or egg yolks. Chicken or [place] with skin. Dairy Whole or 2% milk, cream, and half-and-half. Whole or  full-fat cream cheese. Whole-fat or sweetened yogurt. Full-fat cheese. Nondairy creamers. Whipped toppings. Processed cheese and cheese spreads. Fats and oils Butter. Stick margarine. Lard. Shortening. Ghee. Bacon fat. Tropical oils, such as coconut, palm kernel, or palm oil. Seasoning and other foods Salted popcorn and pretzels. Onion salt, garlic salt, seasoned salt, table salt, and sea salt. Worcestershire sauce. Tartar sauce. Barbecue sauce. Teriyaki sauce. Soy sauce, including reduced-sodium. Steak sauce. Canned and packaged gravies. Fish sauce. Oyster sauce. Cocktail sauce. Horseradish that you find on the shelf. Ketchup. Mustard. Meat flavorings and tenderizers. Bouillon cubes. Hot sauce and Tabasco sauce. Premade or packaged marinades. Premade or packaged taco seasonings. Relishes. Regular salad dressings. Where to find more information:  National Heart, Lung, and [place]: https://wilson-eaton.com/  American Heart Association: www.heart.org Summary  The DASH eating plan is a healthy eating plan that has been shown to reduce high blood pressure (hypertension). It may also reduce your risk for type 2 diabetes, heart disease, and stroke.  With the DASH eating plan, you should limit salt (sodium) intake to 2,300 mg a day. If you have hypertension, you may need to reduce your sodium intake to 1,500 mg a day.  When on the DASH eating plan, aim to eat more fresh fruits and vegetables, whole grains, lean proteins, low-fat dairy, and  heart-healthy fats.  Work with your health care provider or diet and nutrition specialist (dietitian) to adjust your eating plan to your individual calorie needs. This information is not intended to replace advice given to you by your health care provider. Make sure you discuss any questions you have with your health care provider. Document Released: 08/22/2011 Document Revised: 08/26/2016 Document Reviewed: 08/26/2016 Elsevier Interactive Patient Education  2017  [employer].

## 2017-06-05 NOTE — Progress Notes (Signed)
Chief Complaint  Patient presents with  . Headache   Headaches for the last week Started checking her BP a couple days ago and it is consistently elevated.  In the 140-150/85-95 range.  Pulse is up. Denies increased stress Denies OTC meds or supplements No salt intake BP is high in both parents Weight is stable   Patient Active Problem List   Diagnosis Date Noted  . Major depressive disorder 04/02/2017  . GAD (generalized anxiety disorder) 04/02/2017  . Panic disorder 04/02/2017  . Vitamin D deficiency 02/21/2017  . Tobacco abuse 01/16/2017  . Atherosclerosis of aorta (HCC) 01/16/2017  . Degenerative arthritis of spine 01/16/2017  . Chronic midline back pain 01/16/2017  . Generalized social phobia 01/16/2017  . Overweight 01/16/2017    Outpatient Encounter Prescriptions as of 06/05/2017  Medication Sig  . ciclopirox (PENLAC) 8 % solution Apply topically at bedtime. Apply over nail and surrounding skin. Apply daily over previous coat. After seven (7) days, may remove with alcohol and continue cycle.  . DULoxetine (CYMBALTA) 30 MG capsule TAKE 1 CAPSULE BY MOUTH TWICE DAILY.  Marland Kitchen etodolac (LODINE) 400 MG tablet TAKE 1 TABLET BY MOUTH TWICE DAILY.  Marland Kitchen gabapentin (NEURONTIN) 300 MG capsule Take 1 capsule (300 mg total) by mouth 3 (three) times daily.  Marland Kitchen tiZANidine (ZANAFLEX) 4 MG tablet TAKE 1 TABLET BY MOUTH THREE TIMES DAILY.  Marland Kitchen Vitamin D, Ergocalciferol, (DRISDOL) 50000 units CAPS capsule Take 1 capsule (50,000 Units total) by mouth every 7 (seven) days.   No facility-administered encounter medications on file as of 06/05/2017.     No Known Allergies  Review of Systems  Constitutional: Negative for activity change, appetite change and unexpected weight change.  HENT: Negative for congestion, dental problem, postnasal drip and rhinorrhea.   Eyes: Negative for redness and visual disturbance.  Respiratory: Negative for cough and shortness of breath.   Cardiovascular:  Negative for chest pain, palpitations and leg swelling.  Gastrointestinal: Negative for abdominal pain, constipation and diarrhea.  Genitourinary: Negative for difficulty urinating, frequency and menstrual problem.  Musculoskeletal: Positive for back pain. Negative for arthralgias.       Chronic complaint  Neurological: Positive for headaches. Negative for dizziness.  Psychiatric/Behavioral: Positive for sleep disturbance. Negative for dysphoric mood. The patient is nervous/anxious.        Stable    BP (!) 144/88   Pulse (!) 102   Temp 98.3 F (36.8 C) (Temporal)   Resp 16   Ht  (1.727 m)   Wt 183 lb (83 kg)   SpO2 99%   BMI 27.83 kg/m   Physical Exam  Constitutional: She is oriented to person, place, and time. She appears well-developed and well-nourished. No distress.  No apparent discomfort  HENT:  Head: Normocephalic and atraumatic.  Mouth/Throat: Oropharynx is clear and moist.  Eyes: Pupils are equal, round, and reactive to light. Conjunctivae are normal.  Discs flat  Cardiovascular: Normal rate, regular rhythm, normal heart sounds and intact distal pulses.   No murmur heard. Pulmonary/Chest: Effort normal and breath sounds normal.  Abdominal: Soft. Bowel sounds are normal.  Neurological: She is alert and oriented to person, place, and time.  Psychiatric: She has a normal mood and affect. Her behavior is normal. Thought content normal.  Mild emotional upset    ASSESSMENT/PLAN:  1. Need for influenza vaccination - Flu Vaccine QUAD 36+ mos IM  2. Episodic tension-type headache, not intractable  3. Elevated blood-pressure reading, without diagnosis of hypertension Likely  new onset hypertension.  discussed lifestyle changes such ad diet and exercise, weight reduction, to treat hypertension without medications. - COMPLETE METABOLIC PANEL WITH GFR - CBC - TSH - EKG 12-Lead - DG Chest 2 View; Future   Patient Instructions  Drink more water Walk every  day DASH diet - low salt Need labs Need x ray See me in 1-2 weeks   DASH Eating Plan DASH stands for "Dietary Approaches to Stop Hypertension." The DASH eating plan is a healthy eating plan that has been shown to reduce high blood pressure (hypertension). It may also reduce your risk for type 2 diabetes, heart disease, and stroke. The DASH eating plan may also help with weight loss. What are tips for following this plan? General guidelines  Avoid eating more than 2,300 mg (milligrams) of salt (sodium) a day. If you have hypertension, you may need to reduce your sodium intake to 1,500 mg a day.  Limit alcohol intake to no more than 1 drink a day for nonpregnant women and 2 drinks a day for men. One drink equals 12 oz of beer, 5 oz of wine, or 1 oz of hard liquor.  Work with your health care provider to maintain a healthy body weight or to lose weight. Ask what an ideal weight is for you.  Get at least 30 minutes of exercise that causes your heart to beat faster (aerobic exercise) most days of the week. Activities may include walking, swimming, or biking.  Work with your health care provider or diet and nutrition specialist (dietitian) to adjust your eating plan to your individual calorie needs. Reading food labels  Check food labels for the amount of sodium per serving. Choose foods with less than 5 percent of the Daily Value of sodium. Generally, foods with less than 300 mg of sodium per serving fit into this eating plan.  To find whole grains, look for the word "whole" as the first word in the ingredient list. Shopping  Buy products labeled as "low-sodium" or "no salt added."  Buy fresh foods. Avoid canned foods and premade or frozen meals. Cooking  Avoid adding salt when cooking. Use salt-free seasonings or herbs instead of table salt or sea salt. Check with your health care provider or pharmacist before using salt substitutes.  Do not fry foods. Cook foods using healthy methods  such as baking, boiling, grilling, and broiling instead.  Cook with heart-healthy oils, such as olive, canola, soybean, or sunflower oil. Meal planning   Eat a balanced diet that includes: ? 5 or more servings of fruits and vegetables each day. At each meal, try to fill half of your plate with fruits and vegetables. ? Up to 6-8 servings of whole grains each day. ? Less than 6 oz of lean meat, poultry, or fish each day. A 3-oz serving of meat is about the same size as a deck of cards. One egg equals 1 oz. ? 2 servings of low-fat dairy each day. ? A serving of nuts, seeds, or beans 5 times each week. ? Heart-healthy fats. Healthy fats called Omega-3 fatty acids are found in foods such as flaxseeds and coldwater fish, like sardines, salmon, and mackerel.  Limit how much you eat of the following: ? Canned or prepackaged foods. ? Food that is high in trans fat, such as fried foods. ? Food that is high in saturated fat, such as fatty meat. ? Sweets, desserts, sugary drinks, and other foods with added sugar. ? Full-fat dairy products.  Do  not salt foods before eating.  Try to eat at least 2 vegetarian meals each week.  Eat more home-cooked food and less restaurant, buffet, and fast food.  When eating at a restaurant, ask that your food be prepared with less salt or no salt, if possible. What foods are recommended? The items listed may not be a complete list. Talk with your dietitian about what dietary choices are best for you. Grains Whole-grain or whole-wheat bread. Whole-grain or whole-wheat pasta. Brown rice. Orpah Cobb. Bulgur. Whole-grain and low-sodium cereals. Pita bread. Low-fat, low-sodium crackers. Whole-wheat flour tortillas. Vegetables Fresh or frozen vegetables (raw, steamed, roasted, or grilled). Low-sodium or reduced-sodium tomato and vegetable juice. Low-sodium or reduced-sodium tomato sauce and tomato paste. Low-sodium or reduced-sodium canned vegetables. Fruits All  fresh, dried, or frozen fruit. Canned fruit in natural juice (without added sugar). Meat and other protein foods Skinless chicken or [place]. Ground chicken or [place]. Pork with fat trimmed off. Fish and seafood. Egg whites. Dried beans, peas, or lentils. Unsalted nuts, nut butters, and seeds. Unsalted canned beans. Lean cuts of beef with fat trimmed off. Low-sodium, lean deli meat. Dairy Low-fat (1%) or fat-free (skim) milk. Fat-free, low-fat, or reduced-fat cheeses. Nonfat, low-sodium ricotta or cottage cheese. Low-fat or nonfat yogurt. Low-fat, low-sodium cheese. Fats and oils Soft margarine without trans fats. Vegetable oil. Low-fat, reduced-fat, or light mayonnaise and salad dressings (reduced-sodium). Canola, safflower, olive, soybean, and sunflower oils. Avocado. Seasoning and other foods Herbs. Spices. Seasoning mixes without salt. Unsalted popcorn and pretzels. Fat-free sweets. What foods are not recommended? The items listed may not be a complete list. Talk with your dietitian about what dietary choices are best for you. Grains Baked goods made with fat, such as croissants, muffins, or some breads. Dry pasta or rice meal packs. Vegetables Creamed or fried vegetables. Vegetables in a cheese sauce. Regular canned vegetables (not low-sodium or reduced-sodium). Regular canned tomato sauce and paste (not low-sodium or reduced-sodium). Regular tomato and vegetable juice (not low-sodium or reduced-sodium). Rosita Fire. Olives. Fruits Canned fruit in a light or heavy syrup. Fried fruit. Fruit in cream or butter sauce. Meat and other protein foods Fatty cuts of meat. Ribs. Fried meat. Tomasa Blase. Sausage. Bologna and other processed lunch meats. Salami. Fatback. Hotdogs. Bratwurst. Salted nuts and seeds. Canned beans with added salt. Canned or smoked fish. Whole eggs or egg yolks. Chicken or [place] with skin. Dairy Whole or 2% milk, cream, and half-and-half. Whole or full-fat cream cheese. Whole-fat or  sweetened yogurt. Full-fat cheese. Nondairy creamers. Whipped toppings. Processed cheese and cheese spreads. Fats and oils Butter. Stick margarine. Lard. Shortening. Ghee. Bacon fat. Tropical oils, such as coconut, palm kernel, or palm oil. Seasoning and other foods Salted popcorn and pretzels. Onion salt, garlic salt, seasoned salt, table salt, and sea salt. Worcestershire sauce. Tartar sauce. Barbecue sauce. Teriyaki sauce. Soy sauce, including reduced-sodium. Steak sauce. Canned and packaged gravies. Fish sauce. Oyster sauce. Cocktail sauce. Horseradish that you find on the shelf. Ketchup. Mustard. Meat flavorings and tenderizers. Bouillon cubes. Hot sauce and Tabasco sauce. Premade or packaged marinades. Premade or packaged taco seasonings. Relishes. Regular salad dressings. Where to find more information:  National Heart, Lung, and Blood Institute: PopSteam.is  American Heart Association: www.heart.org Summary  The DASH eating plan is a healthy eating plan that has been shown to reduce high blood pressure (hypertension). It may also reduce your risk for type 2 diabetes, heart disease, and stroke.  With the DASH eating plan, you should limit salt (sodium) intake  to 2,300 mg a day. If you have hypertension, you may need to reduce your sodium intake to 1,500 mg a day.  When on the DASH eating plan, aim to eat more fresh fruits and vegetables, whole grains, lean proteins, low-fat dairy, and heart-healthy fats.  Work with your health care provider or diet and nutrition specialist (dietitian) to adjust your eating plan to your individual calorie needs. This information is not intended to replace advice given to you by your health care provider. Make sure you discuss any questions you have with your health care provider. Document Released: 08/22/2011 Document Revised: 08/26/2016 Document Reviewed: 08/26/2016 Elsevier Interactive Patient Education  2017 [employer] Inc.    Eustace Moore, MD

## 2017-06-06 NOTE — Telephone Encounter (Signed)
Patient left a message stating that she had an appointment and an EKG and needs to know what does she need in terms of bloodwork or follow-up?  Callback# [phone]

## 2017-06-06 NOTE — Telephone Encounter (Signed)
Called patient regarding message below. No answer, unable to leave message. Patient has lab orders that she needs to complete and an appointment scheduled.
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
## 2017-07-01 ENCOUNTER — Ambulatory Visit: Payer: Medicaid Other | Admitting: Family Medicine

## 2017-07-01 ENCOUNTER — Encounter (HOSPITAL_COMMUNITY): Payer: Self-pay | Admitting: Psychiatry

## 2017-07-01 ENCOUNTER — Ambulatory Visit (INDEPENDENT_AMBULATORY_CARE_PROVIDER_SITE_OTHER): Payer: Medicaid Other | Admitting: Psychiatry

## 2017-07-01 VITALS — BP 151/94 | HR 111 | Ht 67.0 in | Wt 185.0 lb

## 2017-07-01 DIAGNOSIS — M1990 Unspecified osteoarthritis, unspecified site: Secondary | ICD-10-CM

## 2017-07-01 DIAGNOSIS — G4700 Insomnia, unspecified: Secondary | ICD-10-CM

## 2017-07-01 DIAGNOSIS — Z818 Family history of other mental and behavioral disorders: Secondary | ICD-10-CM

## 2017-07-01 DIAGNOSIS — F419 Anxiety disorder, unspecified: Secondary | ICD-10-CM | POA: Diagnosis not present

## 2017-07-01 DIAGNOSIS — R450 Nervousness: Secondary | ICD-10-CM | POA: Diagnosis not present

## 2017-07-01 DIAGNOSIS — F1290 Cannabis use, unspecified, uncomplicated: Secondary | ICD-10-CM | POA: Diagnosis not present

## 2017-07-01 DIAGNOSIS — F331 Major depressive disorder, recurrent, moderate: Secondary | ICD-10-CM | POA: Diagnosis not present

## 2017-07-01 DIAGNOSIS — I10 Essential (primary) hypertension: Secondary | ICD-10-CM | POA: Diagnosis not present

## 2017-07-01 DIAGNOSIS — R4584 Anhedonia: Secondary | ICD-10-CM | POA: Diagnosis not present

## 2017-07-01 DIAGNOSIS — G44209 Tension-type headache, unspecified, not intractable: Secondary | ICD-10-CM

## 2017-07-01 DIAGNOSIS — Z811 Family history of alcohol abuse and dependence: Secondary | ICD-10-CM

## 2017-07-01 DIAGNOSIS — F410 Panic disorder [episodic paroxysmal anxiety] without agoraphobia: Secondary | ICD-10-CM | POA: Diagnosis not present

## 2017-07-01 MED ORDER — SERTRALINE HCL 50 MG PO TABS: ORAL_TABLET | ORAL | 1 refills | Status: DC

## 2017-07-01 NOTE — Patient Instructions (Signed)
1. Decrease duloxetine 30 mg daily for one week, then discontinue 2. Start sertraline 25 mg daily for one week, then 50 mg daily  3. Return to clinic in one month for 30 mins 4. Referral to therapy

## 2017-07-17 ENCOUNTER — Other Ambulatory Visit: Payer: Self-pay | Admitting: Family Medicine

## 2017-07-21 ENCOUNTER — Ambulatory Visit (INDEPENDENT_AMBULATORY_CARE_PROVIDER_SITE_OTHER): Payer: Medicaid Other | Admitting: Family Medicine

## 2017-07-21 ENCOUNTER — Encounter: Payer: Self-pay | Admitting: Family Medicine

## 2017-07-21 VITALS — BP 136/88 | HR 80 | Temp 98.2°F | Resp 16 | Ht 67.0 in | Wt 191.1 lb

## 2017-07-21 DIAGNOSIS — F331 Major depressive disorder, recurrent, moderate: Secondary | ICD-10-CM

## 2017-07-21 DIAGNOSIS — G8929 Other chronic pain: Secondary | ICD-10-CM | POA: Diagnosis not present

## 2017-07-21 DIAGNOSIS — F4011 Social phobia, generalized: Secondary | ICD-10-CM | POA: Diagnosis not present

## 2017-07-21 DIAGNOSIS — M545 Low back pain, unspecified: Secondary | ICD-10-CM

## 2017-07-21 DIAGNOSIS — F411 Generalized anxiety disorder: Secondary | ICD-10-CM | POA: Diagnosis not present

## 2017-07-21 MED ORDER — METOPROLOL SUCCINATE ER 25 MG PO TB24: 25.0000 mg | ORAL_TABLET | Freq: Every day | ORAL | 3 refills | Status: DC

## 2017-07-21 MED ORDER — ETODOLAC 400 MG PO TABS: 400.0000 mg | ORAL_TABLET | Freq: Two times a day (BID) | ORAL | 0 refills | Status: DC

## 2017-07-21 MED ORDER — TIZANIDINE HCL 4 MG PO TABS: 4.0000 mg | ORAL_TABLET | Freq: Three times a day (TID) | ORAL | 0 refills | Status: DC

## 2017-07-21 NOTE — Progress Notes (Signed)
Chief Complaint  Patient presents with  . Follow-up  Tiffany Morgan is here for follow-up Her blood pressure still elevated.  Her blood pressure at home is elevated.  She is having increased headaches.  We did start her on a beta-blocker to see if this helps both her headaches and her blood pressure. She still complains a lot of stress and anxiety.  She did see the psychiatrist as recommended.  The psychiatrist recommended that she stop her duloxetine and take sertraline instead.  Tiffany Morgan try this for a few days but did not like it.  She has now gone back on duloxetine.  She has an appointment with a psychiatrist tomorrow.  Her psychiatrist did say she would give her a letter to keep her dog is an emotional support animal.  She did not give a letter to her, however.  I printed a letter for her today.  The patient states that Dr. Vanetta ShawlHisada is unable to find her neuropsychiatric testing.  This is present in the chart.  I told her to have Dr. Vanetta ShawlHisada call me if there is problem. Patient Active Problem List   Diagnosis Date Noted  . MDD (major depressive disorder), recurrent episode, moderate (HCC) 07/01/2017  . Major depressive disorder 04/02/2017  . GAD (generalized anxiety disorder) 04/02/2017  . Panic disorder 04/02/2017  . Vitamin D deficiency 02/21/2017  . Tobacco abuse 01/16/2017  . Atherosclerosis of aorta (HCC) 01/16/2017  . Degenerative arthritis of spine 01/16/2017  . Chronic midline back pain 01/16/2017  . Generalized social phobia 01/16/2017  . Overweight 01/16/2017    Outpatient Encounter Medications as of 07/21/2017  Medication Sig  . etodolac (LODINE) 400 MG tablet Take 1 tablet (400 mg total) 2 (two) times daily by mouth.  . gabapentin (NEURONTIN) 300 MG capsule Take 1 capsule (300 mg total) by mouth 3 (three) times daily.  Marland Kitchen. tiZANidine (ZANAFLEX) 4 MG tablet Take 1 tablet (4 mg total) 3 (three) times daily by mouth.  . Vitamin D, Ergocalciferol, (DRISDOL) 50000 units CAPS  capsule TAKE 1 CAPSULE BY MOUTH ONCE WEEKLY.  . [DISCONTINUED] etodolac (LODINE) 400 MG tablet TAKE 1 TABLET BY MOUTH TWICE DAILY.  . [DISCONTINUED] tiZANidine (ZANAFLEX) 4 MG tablet TAKE 1 TABLET BY MOUTH THREE TIMES DAILY.  . ciclopirox (PENLAC) 8 % solution Apply topically at bedtime. Apply over nail and surrounding skin. Apply daily over previous coat. After seven (7) days, may remove with alcohol and continue cycle. (Patient not taking: Reported on 07/21/2017)  . metoprolol succinate (TOPROL-XL) 25 MG 24 hr tablet Take 1 tablet (25 mg total) daily by mouth.  . [DISCONTINUED] sertraline (ZOLOFT) 50 MG tablet 25 mg daily for one week then 50 mg daily (Patient not taking: Reported on 07/21/2017)   No facility-administered encounter medications on file as of 07/21/2017.     No Known Allergies  Review of Systems  Constitutional: Negative for activity change, appetite change and unexpected weight change.  HENT: Negative for congestion, dental problem, postnasal drip and rhinorrhea.   Eyes: Negative for redness and visual disturbance.  Respiratory: Negative for cough and shortness of breath.   Cardiovascular: Negative for chest pain, palpitations and leg swelling.  Gastrointestinal: Negative for abdominal pain, constipation and diarrhea.  Genitourinary: Negative for difficulty urinating, frequency and menstrual problem.  Musculoskeletal: Positive for back pain. Negative for arthralgias.       Chronic complaint  Neurological: Positive for headaches. Negative for dizziness.  Psychiatric/Behavioral: Positive for sleep disturbance. Negative for dysphoric mood. The patient is  nervous/anxious.        Stable    BP 136/88   Pulse 80   Temp 98.2 F (36.8 C) (Temporal)   Resp 16   Ht 5\' 7"  (1.702 m)   Wt 191 lb 1.9 oz (86.7 kg)   SpO2 99%   BMI 29.93 kg/m   Physical Exam  Constitutional: She is oriented to person, place, and time. She appears well-developed and well-nourished. No distress.   No apparent discomfort  HENT:  Head: Normocephalic and atraumatic.  Mouth/Throat: Oropharynx is clear and moist.  Eyes: Conjunctivae are normal. Pupils are equal, round, and reactive to light.  Cardiovascular: Normal rate, regular rhythm, normal heart sounds and intact distal pulses.  No murmur heard. Pulmonary/Chest: Effort normal and breath sounds normal.  Abdominal: Soft. Bowel sounds are normal.  Neurological: She is alert and oriented to person, place, and time.  Psychiatric: She has a normal mood and affect. Her behavior is normal. Thought content normal.  Mild emotional upset    ASSESSMENT/PLAN:  1. MDD (major depressive disorder), recurrent episode, moderate (HCC) Under care of psychiatry  2. Generalized social phobia Her care psychiatry  3. GAD (generalized anxiety disorder) Under care of psychiatry I advised patient not to change her medication without checking with her doctor.  It is important that she talk to Dr. Vanetta Shawl tomorrow about her medication use.  4. Chronic midline low back pain without sciatica Refill Lodine and tizanidine  5.  Hypertension   Patient Instructions  Take the metoprolol once a day Continue to follow the BP at home  Refilled the medicine  Letter for you- today  See me in a month   Eustace Moore, MD

## 2017-07-21 NOTE — Patient Instructions (Signed)
Take the metoprolol once a day Continue to follow the BP at home  Refilled the medicine  Letter for you- today  See me in a month

## 2017-07-22 ENCOUNTER — Encounter: Payer: Self-pay | Admitting: Family Medicine

## 2017-07-22 LAB — COMPLETE METABOLIC PANEL WITH GFR
AG RATIO: 1.4 (calc) (ref 1.0–2.5)
ALBUMIN MSPROF: 4.2 g/dL (ref 3.6–5.1)
ALT: 9 U/L (ref 6–29)
AST: 13 U/L (ref 10–35)
Alkaline phosphatase (APISO): 108 U/L (ref 33–115)
BILIRUBIN TOTAL: 0.3 mg/dL (ref 0.2–1.2)
BUN / CREAT RATIO: 6 (calc) (ref 6–22)
BUN: 5 mg/dL — ABNORMAL LOW (ref 7–25)
CHLORIDE: 103 mmol/L (ref 98–110)
CO2: 29 mmol/L (ref 20–32)
Calcium: 9.7 mg/dL (ref 8.6–10.2)
Creat: 0.79 mg/dL (ref 0.50–1.10)
GFR, EST AFRICAN AMERICAN: 103 mL/min/{1.73_m2} (ref >=60)
GFR, EST NON AFRICAN AMERICAN: 89 mL/min/{1.73_m2} (ref >=60)
GLOBULIN: 2.9 g/dL (ref 1.9–3.7)
Glucose, Bld: 91 mg/dL (ref 65–139)
POTASSIUM: 4.4 mmol/L (ref 3.5–5.3)
SODIUM: 137 mmol/L (ref 135–146)
TOTAL PROTEIN: 7.1 g/dL (ref 6.1–8.1)

## 2017-07-22 LAB — TSH: TSH: 2.87 mIU/L

## 2017-07-22 LAB — CBC
HCT: 41.7 % (ref 35.0–45.0)
Hemoglobin: 14.1 g/dL (ref 11.7–15.5)
MCH: 29.9 pg (ref 27.0–33.0)
MCHC: 33.8 g/dL (ref 32.0–36.0)
MCV: 88.5 fL (ref 80.0–100.0)
MPV: 9.6 fL (ref 7.5–12.5)
PLATELETS: 423 10*3/uL — AB (ref 140–400)
RBC: 4.71 10*6/uL (ref 3.80–5.10)
RDW: 14.1 % (ref 11.0–15.0)
WBC: 14.3 10*3/uL — ABNORMAL HIGH (ref 3.8–10.8)

## 2017-07-24 ENCOUNTER — Ambulatory Visit: Payer: Medicaid Other | Admitting: Family Medicine
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
## 2017-08-01 ENCOUNTER — Encounter (HOSPITAL_COMMUNITY): Payer: Self-pay | Admitting: Psychiatry

## 2017-08-01 ENCOUNTER — Other Ambulatory Visit: Payer: Self-pay

## 2017-08-01 ENCOUNTER — Ambulatory Visit (INDEPENDENT_AMBULATORY_CARE_PROVIDER_SITE_OTHER): Payer: Medicaid Other | Admitting: Psychiatry

## 2017-08-01 VITALS — BP 126/82 | HR 84 | Ht 67.0 in | Wt 186.0 lb

## 2017-08-01 DIAGNOSIS — G44209 Tension-type headache, unspecified, not intractable: Secondary | ICD-10-CM | POA: Diagnosis not present

## 2017-08-01 DIAGNOSIS — F331 Major depressive disorder, recurrent, moderate: Secondary | ICD-10-CM | POA: Diagnosis not present

## 2017-08-01 DIAGNOSIS — F410 Panic disorder [episodic paroxysmal anxiety] without agoraphobia: Secondary | ICD-10-CM

## 2017-08-01 DIAGNOSIS — Z811 Family history of alcohol abuse and dependence: Secondary | ICD-10-CM

## 2017-08-01 DIAGNOSIS — R450 Nervousness: Secondary | ICD-10-CM

## 2017-08-01 DIAGNOSIS — F17210 Nicotine dependence, cigarettes, uncomplicated: Secondary | ICD-10-CM

## 2017-08-01 DIAGNOSIS — I10 Essential (primary) hypertension: Secondary | ICD-10-CM

## 2017-08-01 DIAGNOSIS — Z818 Family history of other mental and behavioral disorders: Secondary | ICD-10-CM | POA: Diagnosis not present

## 2017-08-01 DIAGNOSIS — M1990 Unspecified osteoarthritis, unspecified site: Secondary | ICD-10-CM

## 2017-08-01 DIAGNOSIS — F419 Anxiety disorder, unspecified: Secondary | ICD-10-CM | POA: Diagnosis not present

## 2017-08-01 DIAGNOSIS — Z139 Encounter for screening, unspecified: Secondary | ICD-10-CM

## 2017-08-01 MED ORDER — VENLAFAXINE HCL ER 150 MG PO CP24: ORAL_CAPSULE | ORAL | 0 refills | Status: DC

## 2017-08-01 MED ORDER — LORAZEPAM 0.5 MG PO TABS: 0.5000 mg | ORAL_TABLET | Freq: Every day | ORAL | 0 refills | Status: DC | PRN

## 2017-08-01 MED ORDER — VENLAFAXINE HCL ER 37.5 MG PO CP24: ORAL_CAPSULE | ORAL | 0 refills | Status: DC

## 2017-08-01 NOTE — Patient Instructions (Addendum)
1. Decrease duloxetine 30 mg daily for one week, then discontinue  2. Start venlafaxine 37.5 mg daily for one week, then 75 mg daily for one week, then 150 mg daily  3. Start ativan 0.5 mg daily as needed for anxiety 3. Return to clinic in one month for 30 mins 4. Referral to therapy .

## 2017-08-14 ENCOUNTER — Other Ambulatory Visit: Payer: Self-pay | Admitting: Family Medicine

## 2017-08-21 ENCOUNTER — Other Ambulatory Visit: Payer: Self-pay | Admitting: Family Medicine

## 2017-08-21 ENCOUNTER — Ambulatory Visit: Payer: Self-pay | Admitting: Family Medicine

## 2017-08-21 ENCOUNTER — Other Ambulatory Visit: Payer: Self-pay

## 2017-08-21 LAB — CBC WITH DIFFERENTIAL/PLATELET
BASOS ABS: 142 {cells}/uL (ref 0–200)
Basophils Relative: 1.1 %
EOS ABS: 464 {cells}/uL (ref 15–500)
Eosinophils Relative: 3.6 %
HEMATOCRIT: 39.9 % (ref 35.0–45.0)
HEMOGLOBIN: 13.7 g/dL (ref 11.7–15.5)
LYMPHS ABS: 4786 {cells}/uL — AB (ref 850–3900)
MCH: 30.3 pg (ref 27.0–33.0)
MCHC: 34.3 g/dL (ref 32.0–36.0)
MCV: 88.3 fL (ref 80.0–100.0)
MPV: 10.2 fL (ref 7.5–12.5)
Monocytes Relative: 4.9 %
NEUTROS ABS: 6876 {cells}/uL (ref 1500–7800)
NEUTROS PCT: 53.3 %
PLATELETS: 415 10*3/uL — AB (ref 140–400)
RBC: 4.52 10*6/uL (ref 3.80–5.10)
RDW: 13.8 % (ref 11.0–15.0)
Total Lymphocyte: 37.1 %
WBC mixed population: 632 cells/uL (ref 200–950)
WBC: 12.9 10*3/uL — ABNORMAL HIGH (ref 3.8–10.8)

## 2017-08-22 ENCOUNTER — Encounter: Payer: Self-pay | Admitting: Family Medicine

## 2017-08-22 ENCOUNTER — Other Ambulatory Visit: Payer: Self-pay

## 2017-08-22 ENCOUNTER — Ambulatory Visit (INDEPENDENT_AMBULATORY_CARE_PROVIDER_SITE_OTHER): Payer: Medicaid Other | Admitting: Family Medicine

## 2017-08-22 VITALS — BP 120/76 | HR 96 | Temp 98.4°F | Resp 16 | Ht 67.0 in | Wt 188.1 lb

## 2017-08-22 DIAGNOSIS — I10 Essential (primary) hypertension: Secondary | ICD-10-CM | POA: Diagnosis not present

## 2017-08-22 DIAGNOSIS — F331 Major depressive disorder, recurrent, moderate: Secondary | ICD-10-CM | POA: Diagnosis not present

## 2017-08-22 HISTORY — DX: Essential (primary) hypertension: I10

## 2017-08-22 NOTE — Patient Instructions (Signed)
See me in six months Stay as active as you can manafe Try to cut down or quit smoking Call sooner for problems

## 2017-08-22 NOTE — Progress Notes (Signed)
Chief Complaint  Patient presents with  . Follow-up    lab rev  Patient is here for a follow-up appointment. Her blood pressure is well controlled today.  She is compliant with her medication. She still complains of occasional "head pain".  She states is not a headache gets a sudden pain in her head that will come and go.  She states that her father, uncertain if this is her biologic father, had an aneurysm.  She worries that she needs a head MRI.  I reassured her that her symptoms and her family history did not warrant MRI. Her lab work is reviewed.  She has a mild leukocytosis.  She looked this up on the Internet.  She worries she has leukemia.  I told her that her white count was consistently minimally elevated, normal is 12 versus 13.  This may just be normal for her.  It may be the beginning of some process that needs to be followed, but no action is necessary today. She does have anxiety and depression.  She is under the care of Dr. Vanetta ShawlHisada.  Recently she added Ativan 0.5 to take as needed social anxiety.  This is allowed her to attend more school events with her daughter.   Patient Active Problem List   Diagnosis Date Noted  . Essential hypertension 08/22/2017  . MDD (major depressive disorder), recurrent episode, moderate (HCC) 07/01/2017  . Major depressive disorder 04/02/2017  . GAD (generalized anxiety disorder) 04/02/2017  . Panic disorder 04/02/2017  . Vitamin D deficiency 02/21/2017  . Tobacco abuse 01/16/2017  . Atherosclerosis of aorta (HCC) 01/16/2017  . Degenerative arthritis of spine 01/16/2017  . Chronic midline back pain 01/16/2017  . Generalized social phobia 01/16/2017  . Overweight 01/16/2017    Outpatient Encounter Medications as of 08/22/2017  Medication Sig  . ciclopirox (PENLAC) 8 % solution Apply topically at bedtime. Apply over nail and surrounding skin. Apply daily over previous coat. After seven (7) days, may remove with alcohol and continue cycle.  .  etodolac (LODINE) 400 MG tablet Take 1 tablet (400 mg total) 2 (two) times daily by mouth.  . gabapentin (NEURONTIN) 300 MG capsule Take 1 capsule (300 mg total) by mouth 3 (three) times daily.  Marland Kitchen. LORazepam (ATIVAN) 0.5 MG tablet Take 1 tablet (0.5 mg total) daily as needed by mouth for anxiety.  . metoprolol succinate (TOPROL-XL) 25 MG 24 hr tablet Take 1 tablet (25 mg total) daily by mouth.  Marland Kitchen. tiZANidine (ZANAFLEX) 4 MG tablet Take 1 tablet (4 mg total) 3 (three) times daily by mouth.  . venlafaxine XR (EFFEXOR-XR) 150 MG 24 hr capsule 150 mg daily, start after finishing 75 mg daily for one week  . venlafaxine XR (EFFEXOR-XR) 37.5 MG 24 hr capsule Start 37.5 mg daily for one week, then 75 mg daily for one week, then 150 mg daily  . Vitamin D, Ergocalciferol, (DRISDOL) 50000 units CAPS capsule TAKE 1 CAPSULE BY MOUTH ONCE WEEKLY.   No facility-administered encounter medications on file as of 08/22/2017.     No Known Allergies  Review of Systems  Constitutional: Negative for activity change, appetite change and unexpected weight change.  HENT: Negative for congestion, dental problem, postnasal drip and rhinorrhea.   Eyes: Negative for redness and visual disturbance.  Respiratory: Negative for cough and shortness of breath.   Cardiovascular: Negative for chest pain, palpitations and leg swelling.  Gastrointestinal: Negative for abdominal pain, constipation and diarrhea.  Genitourinary: Negative for difficulty urinating, frequency  and menstrual problem.  Musculoskeletal: Positive for back pain. Negative for arthralgias.       Chronic complaint  Neurological: Positive for headaches. Negative for dizziness.  Psychiatric/Behavioral: Positive for sleep disturbance. Negative for dysphoric mood. The patient is nervous/anxious.        Stable    BP 120/76 (BP Location: Right Arm, Patient Position: Sitting, Cuff Size: Normal)   Pulse 96   Temp 98.4 F (36.9 C) (Temporal)   Resp 16   Ht 5\' 7"   (1.702 m)   Wt 188 lb 1.9 oz (85.3 kg)   SpO2 98%   BMI 29.46 kg/m   Physical Exam  Constitutional: She is oriented to person, place, and time. She appears well-developed and well-nourished. No distress.  No apparent discomfort  HENT:  Head: Normocephalic and atraumatic.  Mouth/Throat: Oropharynx is clear and moist.  Eyes: Conjunctivae are normal. Pupils are equal, round, and reactive to light.  Cardiovascular: Normal rate, regular rhythm and normal heart sounds.  Pulmonary/Chest: Effort normal and breath sounds normal.  Neurological: She is alert and oriented to person, place, and time.  Psychiatric: She has a normal mood and affect. Her behavior is normal. Thought content normal.   ASSESSMENT/PLAN:  1. Essential hypertension Controlled  2. MDD (major depressive disorder), recurrent episode, moderate (HCC) Under care of psychiatry   Patient Instructions  See me in six months Stay as active as you can manafe Try to cut down or quit smoking Call sooner for problems   Eustace MooreYvonne Sue Kolten Ryback, MD

## 2017-08-25 ENCOUNTER — Ambulatory Visit (HOSPITAL_COMMUNITY): Payer: Self-pay | Admitting: Psychiatry

## 2017-08-26 ENCOUNTER — Telehealth (HOSPITAL_COMMUNITY): Payer: Self-pay | Admitting: *Deleted

## 2017-08-26 NOTE — Telephone Encounter (Signed)
left voice message regarding appointment cancelled due to weather.
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
## 2017-09-01 ENCOUNTER — Encounter (HOSPITAL_COMMUNITY): Payer: Self-pay | Admitting: Psychiatry

## 2017-09-01 ENCOUNTER — Ambulatory Visit (INDEPENDENT_AMBULATORY_CARE_PROVIDER_SITE_OTHER): Payer: Medicaid Other | Admitting: Psychiatry

## 2017-09-01 VITALS — BP 124/78 | HR 90 | Ht 67.0 in | Wt 195.0 lb

## 2017-09-01 DIAGNOSIS — F1910 Other psychoactive substance abuse, uncomplicated: Secondary | ICD-10-CM | POA: Diagnosis not present

## 2017-09-01 DIAGNOSIS — R45851 Suicidal ideations: Secondary | ICD-10-CM

## 2017-09-01 DIAGNOSIS — F331 Major depressive disorder, recurrent, moderate: Secondary | ICD-10-CM | POA: Diagnosis not present

## 2017-09-01 DIAGNOSIS — F17210 Nicotine dependence, cigarettes, uncomplicated: Secondary | ICD-10-CM | POA: Diagnosis not present

## 2017-09-01 DIAGNOSIS — R450 Nervousness: Secondary | ICD-10-CM

## 2017-09-01 DIAGNOSIS — Z811 Family history of alcohol abuse and dependence: Secondary | ICD-10-CM | POA: Diagnosis not present

## 2017-09-01 DIAGNOSIS — Z818 Family history of other mental and behavioral disorders: Secondary | ICD-10-CM | POA: Diagnosis not present

## 2017-09-01 DIAGNOSIS — G4700 Insomnia, unspecified: Secondary | ICD-10-CM | POA: Diagnosis not present

## 2017-09-01 DIAGNOSIS — F419 Anxiety disorder, unspecified: Secondary | ICD-10-CM

## 2017-09-01 MED ORDER — VENLAFAXINE HCL ER 150 MG PO CP24: 150.0000 mg | ORAL_CAPSULE | Freq: Every day | ORAL | 0 refills | Status: DC

## 2017-09-01 MED ORDER — LORAZEPAM 0.5 MG PO TABS: 0.5000 mg | ORAL_TABLET | Freq: Every day | ORAL | 0 refills | Status: DC | PRN

## 2017-09-01 MED ORDER — VENLAFAXINE HCL ER 75 MG PO CP24: 75.0000 mg | ORAL_CAPSULE | Freq: Every day | ORAL | 0 refills | Status: DC

## 2017-09-01 NOTE — Patient Instructions (Signed)
1. Increase venlafaxine 225 mg (75 mg +150 mg) daily 2. Continue lorazepam 0.5 mg daily as needed for anxiety 3. Return to clinic in one month for 30 mins

## 2017-09-15 ENCOUNTER — Ambulatory Visit (HOSPITAL_COMMUNITY): Payer: Medicaid Other | Admitting: Psychiatry

## 2017-09-17 ENCOUNTER — Telehealth (HOSPITAL_COMMUNITY): Payer: Self-pay | Admitting: *Deleted

## 2017-09-17 NOTE — Telephone Encounter (Signed)
left voice message, provider out of office 09/24/17. 

## 2017-09-18 ENCOUNTER — Other Ambulatory Visit: Payer: Self-pay | Admitting: Family Medicine

## 2017-09-19 NOTE — Telephone Encounter (Signed)
Is this going to be an ongoing med?

## 2017-09-24 ENCOUNTER — Ambulatory Visit (HOSPITAL_COMMUNITY): Payer: Self-pay | Admitting: Psychiatry

## 2017-09-24 ENCOUNTER — Other Ambulatory Visit: Payer: Self-pay | Admitting: Family Medicine

## 2017-09-25 NOTE — Telephone Encounter (Signed)
Seen 12 7 18 
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
## 2017-10-02 ENCOUNTER — Ambulatory Visit (INDEPENDENT_AMBULATORY_CARE_PROVIDER_SITE_OTHER): Payer: Medicaid Other | Admitting: Psychiatry

## 2017-10-02 ENCOUNTER — Encounter (HOSPITAL_COMMUNITY): Payer: Self-pay | Admitting: Psychiatry

## 2017-10-02 VITALS — BP 121/73 | HR 104 | Ht 67.0 in | Wt 195.0 lb

## 2017-10-02 DIAGNOSIS — F17210 Nicotine dependence, cigarettes, uncomplicated: Secondary | ICD-10-CM

## 2017-10-02 DIAGNOSIS — Z818 Family history of other mental and behavioral disorders: Secondary | ICD-10-CM | POA: Diagnosis not present

## 2017-10-02 DIAGNOSIS — F1210 Cannabis abuse, uncomplicated: Secondary | ICD-10-CM | POA: Diagnosis not present

## 2017-10-02 DIAGNOSIS — F410 Panic disorder [episodic paroxysmal anxiety] without agoraphobia: Secondary | ICD-10-CM | POA: Diagnosis not present

## 2017-10-02 DIAGNOSIS — F419 Anxiety disorder, unspecified: Secondary | ICD-10-CM | POA: Diagnosis not present

## 2017-10-02 DIAGNOSIS — R450 Nervousness: Secondary | ICD-10-CM | POA: Diagnosis not present

## 2017-10-02 DIAGNOSIS — Z811 Family history of alcohol abuse and dependence: Secondary | ICD-10-CM | POA: Diagnosis not present

## 2017-10-02 DIAGNOSIS — F331 Major depressive disorder, recurrent, moderate: Secondary | ICD-10-CM

## 2017-10-02 DIAGNOSIS — G4700 Insomnia, unspecified: Secondary | ICD-10-CM

## 2017-10-02 MED ORDER — TRAZODONE HCL 50 MG PO TABS: ORAL_TABLET | ORAL | 0 refills | Status: DC

## 2017-10-02 MED ORDER — VENLAFAXINE HCL ER 150 MG PO CP24: 150.0000 mg | ORAL_CAPSULE | Freq: Every day | ORAL | 0 refills | Status: DC

## 2017-10-02 MED ORDER — LORAZEPAM 0.5 MG PO TABS: 0.5000 mg | ORAL_TABLET | Freq: Every day | ORAL | 0 refills | Status: DC | PRN

## 2017-10-02 MED ORDER — VENLAFAXINE HCL ER 75 MG PO CP24: 75.0000 mg | ORAL_CAPSULE | Freq: Every day | ORAL | 0 refills | Status: DC

## 2017-10-02 NOTE — Patient Instructions (Signed)
1. Continue venlafaxine 225 mg (75 mg +150 mg) daily 2. Continue lorazepam 0.5 mg daily as needed for anxiety 3. Continue Trazodone 25-50 mg at night as needed for sleep 4.Return to clinic in one month for 30 mins

## 2017-10-03 ENCOUNTER — Ambulatory Visit (HOSPITAL_COMMUNITY): Payer: Self-pay | Admitting: Psychiatry

## 2017-10-16 ENCOUNTER — Ambulatory Visit (HOSPITAL_COMMUNITY): Payer: Self-pay | Admitting: Psychiatry
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
## 2017-11-03 ENCOUNTER — Ambulatory Visit (INDEPENDENT_AMBULATORY_CARE_PROVIDER_SITE_OTHER): Payer: Medicaid Other | Admitting: Psychiatry

## 2017-11-03 ENCOUNTER — Encounter (HOSPITAL_COMMUNITY): Payer: Self-pay | Admitting: Psychiatry

## 2017-11-03 VITALS — BP 129/86 | HR 88 | Ht 67.0 in | Wt 199.0 lb

## 2017-11-03 DIAGNOSIS — I10 Essential (primary) hypertension: Secondary | ICD-10-CM

## 2017-11-03 DIAGNOSIS — Z818 Family history of other mental and behavioral disorders: Secondary | ICD-10-CM

## 2017-11-03 DIAGNOSIS — G44209 Tension-type headache, unspecified, not intractable: Secondary | ICD-10-CM

## 2017-11-03 DIAGNOSIS — Z811 Family history of alcohol abuse and dependence: Secondary | ICD-10-CM

## 2017-11-03 DIAGNOSIS — F1290 Cannabis use, unspecified, uncomplicated: Secondary | ICD-10-CM

## 2017-11-03 DIAGNOSIS — F17210 Nicotine dependence, cigarettes, uncomplicated: Secondary | ICD-10-CM | POA: Diagnosis not present

## 2017-11-03 DIAGNOSIS — F331 Major depressive disorder, recurrent, moderate: Secondary | ICD-10-CM | POA: Diagnosis not present

## 2017-11-03 DIAGNOSIS — M1990 Unspecified osteoarthritis, unspecified site: Secondary | ICD-10-CM | POA: Diagnosis not present

## 2017-11-03 DIAGNOSIS — F410 Panic disorder [episodic paroxysmal anxiety] without agoraphobia: Secondary | ICD-10-CM

## 2017-11-03 DIAGNOSIS — Z91411 Personal history of adult psychological abuse: Secondary | ICD-10-CM

## 2017-11-03 DIAGNOSIS — Z79899 Other long term (current) drug therapy: Secondary | ICD-10-CM

## 2017-11-03 MED ORDER — LORAZEPAM 0.5 MG PO TABS: 0.5000 mg | ORAL_TABLET | Freq: Every day | ORAL | 0 refills | Status: DC | PRN

## 2017-11-03 MED ORDER — TRAZODONE HCL 50 MG PO TABS: ORAL_TABLET | ORAL | 0 refills | Status: DC

## 2017-11-03 MED ORDER — BUSPIRONE HCL 5 MG PO TABS: 5.0000 mg | ORAL_TABLET | Freq: Three times a day (TID) | ORAL | 0 refills | Status: DC

## 2017-11-03 NOTE — Patient Instructions (Signed)
1. Continue venlafaxine 150 mg daily  2. Start buspar 5 mg three times a day  3. Continue lorazepam0.5 mg daily as needed for anxiety 4. Continue Trazodone 25-50 mg at night as needed for sleep 5.Return to clinic in one month for 30 mins

## 2017-11-06 ENCOUNTER — Ambulatory Visit (INDEPENDENT_AMBULATORY_CARE_PROVIDER_SITE_OTHER): Payer: Medicaid Other | Admitting: Psychiatry

## 2017-11-06 ENCOUNTER — Encounter (HOSPITAL_COMMUNITY): Payer: Self-pay | Admitting: Psychiatry

## 2017-11-06 DIAGNOSIS — F331 Major depressive disorder, recurrent, moderate: Secondary | ICD-10-CM | POA: Diagnosis not present
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
## 2017-11-12 ENCOUNTER — Other Ambulatory Visit: Payer: Self-pay | Admitting: Family Medicine

## 2017-11-12 NOTE — Telephone Encounter (Signed)
Seen 12 7 18 

## 2017-11-13 NOTE — Telephone Encounter (Signed)
Seen 12 7 18 

## 2017-11-14 ENCOUNTER — Ambulatory Visit: Payer: Medicaid Other | Admitting: Family Medicine

## 2017-11-17 ENCOUNTER — Encounter: Payer: Self-pay | Admitting: Family Medicine

## 2017-11-17 ENCOUNTER — Ambulatory Visit (INDEPENDENT_AMBULATORY_CARE_PROVIDER_SITE_OTHER): Payer: Medicaid Other | Admitting: Family Medicine

## 2017-11-17 VITALS — BP 120/78 | HR 77 | Temp 98.7°F | Ht 67.0 in | Wt 204.8 lb

## 2017-11-17 DIAGNOSIS — D72829 Elevated white blood cell count, unspecified: Secondary | ICD-10-CM

## 2017-11-17 DIAGNOSIS — N393 Stress incontinence (female) (male): Secondary | ICD-10-CM | POA: Diagnosis not present

## 2017-11-17 MED ORDER — TIZANIDINE HCL 4 MG PO TABS: 4.0000 mg | ORAL_TABLET | Freq: Three times a day (TID) | ORAL | 0 refills | Status: DC
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
## 2017-11-17 NOTE — Patient Instructions (Addendum)
I am referring you to a urinary specialist for the incontinence I am referring you to HEMATOLOGY for the increased white count  Continue other medicines  I will refill the tizanidine

## 2017-11-24 ENCOUNTER — Encounter (HOSPITAL_COMMUNITY): Payer: Self-pay | Admitting: Psychiatry

## 2017-11-24 ENCOUNTER — Encounter: Payer: Self-pay | Admitting: Family Medicine

## 2017-11-24 ENCOUNTER — Ambulatory Visit (INDEPENDENT_AMBULATORY_CARE_PROVIDER_SITE_OTHER): Payer: Medicaid Other | Admitting: Psychiatry

## 2017-11-24 DIAGNOSIS — F331 Major depressive disorder, recurrent, moderate: Secondary | ICD-10-CM | POA: Diagnosis not present
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
## 2017-12-01 ENCOUNTER — Encounter (HOSPITAL_COMMUNITY): Payer: Self-pay | Admitting: Psychiatry

## 2017-12-01 ENCOUNTER — Ambulatory Visit (INDEPENDENT_AMBULATORY_CARE_PROVIDER_SITE_OTHER): Payer: Medicaid Other | Admitting: Psychiatry

## 2017-12-01 ENCOUNTER — Ambulatory Visit (HOSPITAL_COMMUNITY): Payer: Self-pay | Admitting: Hematology

## 2017-12-01 DIAGNOSIS — F331 Major depressive disorder, recurrent, moderate: Secondary | ICD-10-CM | POA: Diagnosis not present

## 2017-12-01 DIAGNOSIS — F410 Panic disorder [episodic paroxysmal anxiety] without agoraphobia: Secondary | ICD-10-CM | POA: Diagnosis not present
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
## 2017-12-02 ENCOUNTER — Encounter (HOSPITAL_COMMUNITY): Payer: Self-pay | Admitting: Psychiatry

## 2017-12-02 ENCOUNTER — Ambulatory Visit (INDEPENDENT_AMBULATORY_CARE_PROVIDER_SITE_OTHER): Payer: Medicaid Other | Admitting: Psychiatry

## 2017-12-02 VITALS — BP 130/82 | HR 76 | Ht 67.0 in | Wt 205.0 lb

## 2017-12-02 DIAGNOSIS — Z818 Family history of other mental and behavioral disorders: Secondary | ICD-10-CM | POA: Diagnosis not present

## 2017-12-02 DIAGNOSIS — F331 Major depressive disorder, recurrent, moderate: Secondary | ICD-10-CM | POA: Diagnosis not present

## 2017-12-02 DIAGNOSIS — F17210 Nicotine dependence, cigarettes, uncomplicated: Secondary | ICD-10-CM

## 2017-12-02 DIAGNOSIS — F410 Panic disorder [episodic paroxysmal anxiety] without agoraphobia: Secondary | ICD-10-CM

## 2017-12-02 DIAGNOSIS — Z811 Family history of alcohol abuse and dependence: Secondary | ICD-10-CM

## 2017-12-02 DIAGNOSIS — G4700 Insomnia, unspecified: Secondary | ICD-10-CM

## 2017-12-02 DIAGNOSIS — F419 Anxiety disorder, unspecified: Secondary | ICD-10-CM

## 2017-12-02 DIAGNOSIS — R450 Nervousness: Secondary | ICD-10-CM | POA: Diagnosis not present

## 2017-12-02 DIAGNOSIS — F1290 Cannabis use, unspecified, uncomplicated: Secondary | ICD-10-CM | POA: Diagnosis not present

## 2017-12-02 MED ORDER — VENLAFAXINE HCL ER 150 MG PO CP24: 150.0000 mg | ORAL_CAPSULE | Freq: Every day | ORAL | 0 refills | Status: DC

## 2017-12-02 MED ORDER — LORAZEPAM 0.5 MG PO TABS: 0.5000 mg | ORAL_TABLET | Freq: Every day | ORAL | 1 refills | Status: DC | PRN

## 2017-12-02 MED ORDER — TRAZODONE HCL 50 MG PO TABS: ORAL_TABLET | ORAL | 0 refills | Status: DC

## 2017-12-02 MED ORDER — BUSPIRONE HCL 5 MG PO TABS: 5.0000 mg | ORAL_TABLET | Freq: Three times a day (TID) | ORAL | 0 refills | Status: DC

## 2017-12-02 NOTE — Patient Instructions (Addendum)
1.Contniuevenlafaxine 150 mg daily  2. Continue buspar 5 mg three times a day  3. Continue lorazepam0.5 mg daily as needed for anxiety 4.Continue Trazodone25-50 mg at night as needed for sleep 5.Return to clinic in two months for 30 mins

## 2017-12-09 ENCOUNTER — Inpatient Hospital Stay (HOSPITAL_COMMUNITY): Payer: Medicaid Other

## 2017-12-09 ENCOUNTER — Inpatient Hospital Stay (HOSPITAL_COMMUNITY): Payer: Medicaid Other | Attending: Hematology | Admitting: Hematology

## 2017-12-09 ENCOUNTER — Other Ambulatory Visit: Payer: Self-pay

## 2017-12-09 ENCOUNTER — Ambulatory Visit (HOSPITAL_COMMUNITY): Payer: Self-pay | Admitting: Psychiatry

## 2017-12-09 ENCOUNTER — Encounter (HOSPITAL_COMMUNITY): Payer: Self-pay | Admitting: Hematology

## 2017-12-09 VITALS — BP 131/75 | HR 69 | Temp 98.9°F | Resp 16 | Ht 67.0 in | Wt 208.4 lb

## 2017-12-09 DIAGNOSIS — D72820 Lymphocytosis (symptomatic): Secondary | ICD-10-CM | POA: Diagnosis present

## 2017-12-09 LAB — CBC WITH DIFFERENTIAL/PLATELET
BASOS ABS: 0.1 10*3/uL (ref 0.0–0.1)
Basophils Relative: 1 %
EOS ABS: 0.4 10*3/uL (ref 0.0–0.7)
Eosinophils Relative: 3 %
HEMATOCRIT: 42 % (ref 36.0–46.0)
Hemoglobin: 13.8 g/dL (ref 12.0–15.0)
LYMPHS ABS: 6.7 10*3/uL (ref 0.7–4.0)
LYMPHS PCT: 43 %
MCH: 30.3 pg (ref 26.0–34.0)
MCHC: 32.9 g/dL (ref 30.0–36.0)
MCV: 92.3 fL (ref 78.0–100.0)
Monocytes Absolute: 1 10*3/uL (ref 0.1–1.0)
Monocytes Relative: 7 %
NEUTROS PCT: 46 %
Neutro Abs: 7.3 10*3/uL (ref 1.7–7.7)
PLATELETS: 351 10*3/uL (ref 150–400)
RBC: 4.55 MIL/uL (ref 3.87–5.11)
RDW: 14.6 % (ref 11.5–15.5)
WBC: 15.5 10*3/uL — AB (ref 4.0–10.5)

## 2017-12-09 LAB — LACTATE DEHYDROGENASE: LDH: 133 U/L (ref 98–192)

## 2017-12-09 LAB — TECHNOLOGIST SMEAR REVIEW

## 2017-12-09 NOTE — Patient Instructions (Signed)
[facility] at [facility] Discharge Instructions  You were seen today by Dr. Katragadda    Thank you for choosing [facility] at [facility] to provide your oncology and hematology care.  To afford each patient quality time with our provider, please arrive at least 15 minutes before your scheduled appointment time.   If you have a lab appointment with the Cancer Center please come in thru the  Main Entrance and check in at the main information desk  You need to re-schedule your appointment should you arrive 10 or more minutes late.  We strive to give you quality time with our providers, and arriving late affects you and other patients whose appointments are after yours.  Also, if you no show three or more times for appointments you may be dismissed from the clinic at the providers discretion.     Again, thank you for choosing [facility].  Our hope is that these requests will decrease the amount of time that you wait before being seen by our physicians.       _____________________________________________________________  Should you have questions after your visit to [facility], please contact our office at [phone] between the hours of 8:30 a.m. and 4:30 p.m.  Voicemails left after 4:30 p.m. will not be returned until the following business day.  For prescription refill requests, have your pharmacy contact our office.       Resources For Cancer Patients and their Caregivers ? American Cancer Society: Can assist with transportation, wigs, general needs, runs Look Good Feel Better.        1-888-227-6333 ? Cancer Care: Provides financial assistance, online support groups, medication/co-pay assistance.  1-800-813-HOPE (4673) ? [facility] Assists [place] cancer patients and their families through emotional , educational and financial support.  336-427-4357 ? [place] DSS Where to  apply for food stamps, Medicaid and utility assistance. 336-342-1394 ? [facility]: Transportation to medical appointments. 336-347-2287 ? Social Security Administration: May apply for disability if have a Stage IV cancer. 336-342-7796 1-800-772-1213 ? [place] Aging, Disability and Transit Services: Assists with nutrition, care and transit needs. 336-349-2343  Cancer Center Support Programs:   > Cancer Support Group  2nd Tuesday of the month 1pm-2pm, Journey Room   > Creative Journey  3rd Tuesday of the month 1130am-1pm, Journey Room    

## 2017-12-09 NOTE — Assessment & Plan Note (Signed)
1.  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  She has mild leukocytosis, predominantly lymphocytosis.  She does not have any B symptoms.  No palpable adenopathy or hepatosplenomegaly on physical examination was noted.  I have recommended repeating a CBC with differential and examination of peripheral smear.  We will also check LDH level.  We will send peripheral blood for flow cytometry for evaluation of CLL/other lymphoproliferative disorders.  We will see her back in 2 weeks to discuss the results.  2.  Thrombocytosis: She had mild increased platelet count seen on the last 2 CBCs.  We will repeat a platelet count today.

## 2017-12-09 NOTE — Progress Notes (Signed)
CONSULT NOTE  Patient Care Team: Eustace MooreNelson, Yvonne Sue, MD as PCP - General (Family Medicine)  CHIEF COMPLAINTS/PURPOSE OF CONSULTATION:  Leukocytosis.  HISTORY OF PRESENTING ILLNESS:  Tiffany Morgan 47 y.o. female is seen in consultation today for further workup and management of leukocytosis.  A CBC done on 08/21/2017 showed a white count of 12.9 with 53% neutrophils and elevated absolute lymphocyte count of 4786.  CBC in November 2018 showed white count of 14.3 although differential was not available.  A CBC done in October 2015 showed elevated white count of 11.2 with 53% lymphocytes and increased absolute lymphocyte count.  She denies palpating any lumps or lymph nodes.  She denies any fevers, night sweats or weight loss in the last 6 months.  She denies any recurrent infections or hospitalizations.  She is very anxious as her maternal grandmother also had MDS.  She does have occasional hot flashes.  She is a current active smoker, smokes half pack per day for the past 23 years.  She lives at home taking care of her 48 year old daughter.  She used to work as a LawyerCNA.  MEDICAL HISTORY:  Past Medical History:  Diagnosis Date  . Anxiety   . Arthritis    spine  . Chronic back pain    started about 6 years ago - four bulging discs  . Depression   . Essential hypertension 08/22/2017    SURGICAL HISTORY: Past Surgical History:  Procedure Laterality Date  . BREAST BIOPSY Left 01/31/2016   benign  . BREAST BIOPSY Left 08/17/2013   benign  . CESAREAN SECTION     3  . TUBAL LIGATION      SOCIAL HISTORY: Social History   Socioeconomic History  . Marital status: Single    Spouse name: Not on file  . Number of children: 2  . Years of education: 6412  . Highest education level: Not on file  Occupational History  . 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 Financial resource strain: Not on file  . Food insecurity:    Worry: Not on file    Inability: Not on file   . Transportation needs:    Medical: Not on file    Non-medical: Not on file  Tobacco Use  . Smoking status: Current Every Day Smoker    Packs/day: 0.50    Years: 23.00    Pack years: 11.50    Types: Cigarettes    Start date: 09/17/1995  . Smokeless tobacco: Never Used  . Tobacco comment: trying to quit  Substance and Sexual Activity  . Alcohol use: No  . Drug use: No  . Sexual activity: Not Currently    Birth control/protection: Surgical  Lifestyle  . Physical activity:    Days per week: Not on file    Minutes per session: Not on file  . Stress: Not on file  Relationships  . Social connections:    Talks on phone: Not on file    Gets together: Not on file    Attends religious service: Not on file    Active member of club or organization: Not on file    Attends meetings of clubs or organizations: Not on file    Relationship status: Not on file  . Intimate partner violence:    Fear of current or ex partner: Not on file    Emotionally abused: Not on file    Physically abused: Not on file    Forced sexual  activity: Not on file  Other Topics Concern  . Not on file  Social History Narrative   Single   Lives with [name]   Feels unable to work due to back pain    FAMILY HISTORY: Family History  Problem Relation Age of Onset  . Hypertension Mother   . Depression Mother   . Hyperlipidemia Mother   . Heart disease Mother   . Heart attack Mother   . Cancer Father        pancreatic  . Mental illness Sister        schizoid  . Diabetes Sister   . Schizophrenia Sister   . Cancer Maternal Grandmother        myleodysplastic syndrome  . Depression Maternal Grandmother   . Myelodysplastic syndrome Maternal Grandmother   . Diabetes Maternal Grandmother   . Hypertension Maternal Grandmother   . Alcohol abuse Maternal Grandfather     ALLERGIES:  has No Known Allergies.  MEDICATIONS:  Current Outpatient Medications  Medication Sig Dispense Refill  . busPIRone  (BUSPAR) 5 MG tablet Take 1 tablet (5 mg total) by mouth 3 (three) times daily. 270 tablet 0  . ciclopirox (PENLAC) 8 % solution Apply topically at bedtime. Apply over nail and surrounding skin. Apply daily over previous coat. After seven (7) days, may remove with alcohol and continue cycle. 6.6 mL 0  . etodolac (LODINE) 400 MG tablet Take 1 tablet (400 mg total) 2 (two) times daily by mouth. 60 tablet 0  . gabapentin (NEURONTIN) 300 MG capsule TAKE 1 CAPSULE BY MOUTH THREE TIMES DAILY. 90 capsule 0  . LORazepam (ATIVAN) 0.5 MG tablet Take 1 tablet (0.5 mg total) by mouth daily as needed for anxiety. 30 tablet 1  . metoprolol succinate (TOPROL-XL) 25 MG 24 hr tablet Take 1 tablet (25 mg total) daily by mouth. 90 tablet 3  . tiZANidine (ZANAFLEX) 4 MG tablet Take 1 tablet (4 mg total) by mouth 3 (three) times daily. 90 tablet 0  . traZODone (DESYREL) 50 MG tablet 25-50 mg at night as needed for sleep 90 tablet 0  . venlafaxine XR (EFFEXOR-XR) 150 MG 24 hr capsule Take 1 capsule (150 mg total) by mouth daily with breakfast. 90 capsule 0  . Vitamin D, Ergocalciferol, (DRISDOL) 50000 units CAPS capsule TAKE 1 CAPSULE BY MOUTH ONCE WEEKLY. 4 capsule 0   No current facility-administered medications for this visit.     REVIEW OF SYSTEMS:   Constitutional: Denies fevers, chills or abnormal night sweats.  Some fatigue was reported. Eyes: Denies blurriness of vision, double vision or watery eyes Ears, nose, mouth, throat, and face: Denies mucositis or sore throat Respiratory: Denies cough, dyspnea or wheezes Cardiovascular: Denies palpitation, chest discomfort or lower extremity swelling Gastrointestinal:  Denies nausea, heartburn but reports alternating constipation and diarrhea. Skin: Denies abnormal skin rashes Lymphatics: Denies new lymphadenopathy or easy bruising Neurological:Denies numbness, tingling or new weaknesses.  Reports occasional dizziness. Behavioral/Psych: Mood is stable, no new  changes  All other systems were reviewed with the patient and are negative.  PHYSICAL EXAMINATION: ECOG PERFORMANCE STATUS: 0 - Asymptomatic  Vitals:   12/09/17 1147  BP: 131/75  Pulse: 69  Resp: 16  Temp: 98.9 F (37.2 C)  SpO2: 99%   Filed Weights   12/09/17 1147  Weight: 208 lb 6.4 oz (94.5 kg)    GENERAL:alert, no distress and comfortable SKIN: skin color, texture, turgor are normal, no rashes or significant lesions EYES: normal, conjunctiva are pink and  non-injected, sclera clear OROPHARYNX:no exudate, no erythema and lips, buccal mucosa, and tongue normal  NECK: supple, thyroid normal size, non-tender, without nodularity LYMPH:  no palpable lymphadenopathy in the cervical, axillary or inguinal LUNGS: clear to auscultation and percussion with normal breathing effort HEART: regular rate & rhythm and no murmurs and no lower extremity edema ABDOMEN:abdomen soft, non-tender and normal bowel sounds Musculoskeletal:no cyanosis of digits and no clubbing  PSYCH: alert & oriented x 3 with fluent speech NEURO: no focal motor/sensory deficits  LABORATORY DATA:  I have reviewed the data as listed Recent Results (from the past 2160 hour(s))  CBC with Differential/Platelet     Status: Abnormal   Collection Time: 12/09/17 12:14 PM  Result Value Ref Range   WBC 15.5 (H) 4.0 - 10.5 K/uL   RBC 4.55 3.87 - 5.11 MIL/uL   Hemoglobin 13.8 12.0 - 15.0 g/dL   HCT 16.1 09.6 - 04.5 %   MCV 92.3 78.0 - 100.0 fL   MCH 30.3 26.0 - 34.0 pg   MCHC 32.9 30.0 - 36.0 g/dL   RDW 40.9 81.1 - 91.4 %   Platelets 351 150 - 400 K/uL   Neutrophils Relative % 46 %   Neutro Abs 7.3 1.7 - 7.7 K/uL   Lymphocytes Relative 43 %   Lymphs Abs 6.7 0.7 - 4.0 K/uL   Monocytes Relative 7 %   Monocytes Absolute 1.0 0.1 - 1.0 K/uL   Eosinophils Relative 3 %   Eosinophils Absolute 0.4 0.0 - 0.7 K/uL   Basophils Relative 1 %   Basophils Absolute 0.1 0.0 - 0.1 K/uL   WBC Morphology ABSOLUTE LYMPHOCYTOSIS      Comment: ATYPICAL LYMPHOCYTES SMUDGE CELLS Performed at [facility], [address], [place], [place] [address]   Lactate dehydrogenase     Status: None   Collection Time: 12/09/17 12:14 PM  Result Value Ref Range   LDH 133 98 - 192 U/L    Comment: Performed at [facility], [address], [place], [place] [address]  Technologist smear review     Status: None   Collection Time: 12/09/17 12:14 PM  Result Value Ref Range   Tech Review ABSOLUTE LYMPHOCYTOSIS     Comment: ATYPICAL LYMPHOCYTES SMUDGE CELLS Performed at [facility], [address] [place], [place] [address]     RADIOGRAPHIC STUDIES: I have reviewed her mammogram from June 2018 which was BI-RADS Category 1.  ASSESSMENT & PLAN:  Lymphocytosis 1.  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  She has mild leukocytosis, predominantly lymphocytosis.  She does not have any B symptoms.  No palpable adenopathy or hepatosplenomegaly on physical examination was noted.  I have recommended repeating a CBC with differential and examination of peripheral smear.  We will also check LDH level.  We will send peripheral blood for flow cytometry for evaluation of CLL/other lymphoproliferative disorders.  We will see her back in 2 weeks to discuss the results.  2.  Thrombocytosis: She had mild increased platelet count seen on the last 2 CBCs.  We will repeat a platelet count today.      All questions were answered. The patient knows to call the clinic with any problems, questions or concerns.    Doreatha Massed, MD 12/09/17 1:00 PM

## 2017-12-10 ENCOUNTER — Other Ambulatory Visit: Payer: Self-pay | Admitting: Family Medicine

## 2017-12-17 ENCOUNTER — Telehealth: Payer: Self-pay

## 2017-12-17 ENCOUNTER — Ambulatory Visit (INDEPENDENT_AMBULATORY_CARE_PROVIDER_SITE_OTHER): Payer: Medicaid Other

## 2017-12-17 DIAGNOSIS — R8290 Unspecified abnormal findings in urine: Secondary | ICD-10-CM

## 2017-12-17 LAB — POCT URINALYSIS DIPSTICK
Blood, UA: NEGATIVE
Glucose, UA: NEGATIVE
Ketones, UA: NEGATIVE
LEUKOCYTES UA: NEGATIVE
NITRITE UA: NEGATIVE
PROTEIN UA: NEGATIVE
Spec Grav, UA: >=1.03 — AB (ref 1.010–1.025)
Urobilinogen, UA: 0.2 E.U./dL
pH, UA: 5.5 (ref 5.0–8.0)

## 2017-12-17 NOTE — Progress Notes (Signed)
UA negative for infection. Large BIL noted. Discussed with Dr.Hagler. Pt advised to make an appt.

## 2017-12-17 NOTE — Telephone Encounter (Signed)
Patient came into the office c/o urinary symptoms. Left sample and left office before we were able to run dipstick. Ran UA dipstick w/ negative results except for Large amt of BIL. Let Dr.Hagler know. Per Dr.Hagler, called pt and spoke with her and let her know there was no explanation for her symptoms and she needed an appt w/ Dr.Nelson sooner rather than later. Offered appt for 12/18/17. Pt stated she had an appt scheduled w/ urology on Monday and would just keep that appt. She wasn't having any burning or itching. Just urgency and she woke up this morning and she guessed she coughed during the night and lost control of her bladder because she smelled something that smelled like ammonia. Again offered an appt with Dr. Delton SeeNelson tomorrow and pt wanted to keep appt w/ urology already scheduled for Monday 12/24/17.

## 2017-12-22 ENCOUNTER — Other Ambulatory Visit: Payer: Self-pay | Admitting: Family Medicine

## 2017-12-23 ENCOUNTER — Ambulatory Visit: Payer: Medicaid Other | Admitting: Urology

## 2017-12-23 DIAGNOSIS — N3946 Mixed incontinence: Secondary | ICD-10-CM

## 2017-12-24 ENCOUNTER — Inpatient Hospital Stay (HOSPITAL_COMMUNITY): Payer: Medicaid Other

## 2017-12-24 ENCOUNTER — Inpatient Hospital Stay (HOSPITAL_COMMUNITY): Payer: Medicaid Other | Attending: Hematology | Admitting: Hematology

## 2017-12-24 ENCOUNTER — Encounter (HOSPITAL_COMMUNITY): Payer: Self-pay | Admitting: Hematology

## 2017-12-24 VITALS — BP 122/73 | HR 95 | Temp 98.5°F | Resp 18 | Wt 205.3 lb

## 2017-12-24 DIAGNOSIS — F17210 Nicotine dependence, cigarettes, uncomplicated: Secondary | ICD-10-CM | POA: Insufficient documentation

## 2017-12-24 DIAGNOSIS — D72829 Elevated white blood cell count, unspecified: Secondary | ICD-10-CM | POA: Diagnosis not present

## 2017-12-24 DIAGNOSIS — D72820 Lymphocytosis (symptomatic): Secondary | ICD-10-CM

## 2017-12-24 NOTE — Assessment & Plan Note (Signed)
1.  Leukocytosis: We have repeated her CBC at last visit.  White count was 15.7.  There was no elevation in the neutrophils or lymphocytes.  Flow cytometry was completely within normal limits.  No lymphoproliferative disorders were identified.  LDH was not elevated.  she does not have any B symptoms.  No palpable lymphadenopathy or splenomegaly.  I have recommended checking her blood for Jak 2 mutation and BCR/ABL by FISH.  This will rule out any underlying myeloproliferative disorders.  We will see her back in 3 weeks for follow-up.  If both the tests are negative, the most likely explanation is smoking induced leukocytosis.

## 2017-12-24 NOTE — Progress Notes (Signed)
[facility] [address] [place], [place] [address]   CLINIC:  Medical Oncology/Hematology  PCP:  Eustace MooreNelson, Yvonne Sue, MD [phone] [place] [address] [phone]   REASON FOR VISIT:  Follow-up for leukocytosis.   INTERVAL HISTORY:  Tiffany Morgan 47 y.o. female returns for follow-up of leukocytosis.  At last visit we have ordered flow cytometry and repeated CBC with LDH level.  Peripheral blood smear did not show any abnormalities.  She denied any fevers, night sweats or weight loss.  She denies any chronic steroid use.  She did not have splenectomy.  She continues to smoke cigarettes daily.    REVIEW OF SYSTEMS:  Review of Systems  Constitutional: Positive for fatigue.  Psychiatric/Behavioral: Positive for sleep disturbance.  All other systems reviewed and are negative.    PAST MEDICAL/SURGICAL HISTORY:  Past Medical History:  Diagnosis Date  . Anxiety   . Arthritis    spine  . Chronic back pain    started about 6 years ago - four bulging discs  . Depression   . Essential hypertension 08/22/2017   Past Surgical History:  Procedure Laterality Date  . BREAST BIOPSY Left 01/31/2016   benign  . BREAST BIOPSY Left 08/17/2013   benign  . CESAREAN SECTION     3  . TUBAL LIGATION       SOCIAL HISTORY:  Social History   Socioeconomic History  . Marital status: Single    Spouse name: Not on file  . Number of children: 2  . Years of education: 3712  . Highest education level: Not on file  Occupational History  . 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 Financial resource strain: Not on file  . Food insecurity:    Worry: Not on file    Inability: Not on file  . Transportation needs:    Medical: Not on file    Non-medical: Not on file  Tobacco Use  . Smoking status: Current Every Day Smoker    Packs/day: 0.50    Years: 23.00    Pack years: 11.50    Types: Cigarettes    Start date: 09/17/1995  . Smokeless  tobacco: Never Used  . Tobacco comment: trying to quit  Substance and Sexual Activity  . Alcohol use: No  . Drug use: No  . Sexual activity: Not Currently    Birth control/protection: Surgical  Lifestyle  . Physical activity:    Days per week: Not on file    Minutes per session: Not on file  . Stress: Not on file  Relationships  . Social connections:    Talks on phone: Not on file    Gets together: Not on file    Attends religious service: Not on file    Active member of club or organization: Not on file    Attends meetings of clubs or organizations: Not on file    Relationship status: Not on file  . Intimate partner violence:    Fear of current or ex partner: Not on file    Emotionally abused: Not on file    Physically abused: Not on file    Forced sexual activity: Not on file  Other Topics Concern  . Not on file  Social History Narrative   Single   Lives with [name] Clint LippsLea   Feels unable to work due to back pain    FAMILY HISTORY:  Family History  Problem Relation Age of  Onset  . Hypertension Mother   . Depression Mother   . Hyperlipidemia Mother   . Heart disease Mother   . Heart attack Mother   . Cancer Father        pancreatic  . Mental illness Sister        schizoid  . Diabetes Sister   . Schizophrenia Sister   . Cancer Maternal Grandmother        myleodysplastic syndrome  . Depression Maternal Grandmother   . Myelodysplastic syndrome Maternal Grandmother   . Diabetes Maternal Grandmother   . Hypertension Maternal Grandmother   . Alcohol abuse Maternal Grandfather     CURRENT MEDICATIONS:  Outpatient Encounter Medications as of 12/24/2017  Medication Sig  . busPIRone (BUSPAR) 5 MG tablet Take 1 tablet (5 mg total) by mouth 3 (three) times daily.  . ciclopirox (PENLAC) 8 % solution Apply topically at bedtime. Apply over nail and surrounding skin. Apply daily over previous coat. After seven (7) days, may remove with alcohol and continue cycle.  . etodolac  (LODINE) 400 MG tablet Take 1 tablet (400 mg total) 2 (two) times daily by mouth.  . etodolac (LODINE) 400 MG tablet TAKE 1 TABLET BY MOUTH TWICE DAILY.  Marland Kitchen gabapentin (NEURONTIN) 300 MG capsule TAKE 1 CAPSULE BY MOUTH THREE TIMES DAILY.  Marland Kitchen LORazepam (ATIVAN) 0.5 MG tablet Take 1 tablet (0.5 mg total) by mouth daily as needed for anxiety.  . metoprolol succinate (TOPROL-XL) 25 MG 24 hr tablet Take 1 tablet (25 mg total) daily by mouth.  Marland Kitchen tiZANidine (ZANAFLEX) 4 MG tablet Take 1 tablet (4 mg total) by mouth 3 (three) times daily.  Marland Kitchen tiZANidine (ZANAFLEX) 4 MG tablet TAKE 1 TABLET BY MOUTH THREE TIMES DAILY AS NEEDED FOR MUSCLE SPASMS.  Marland Kitchen traZODone (DESYREL) 50 MG tablet 25-50 mg at night as needed for sleep  . venlafaxine XR (EFFEXOR-XR) 150 MG 24 hr capsule Take 1 capsule (150 mg total) by mouth daily with breakfast.  . Vitamin D, Ergocalciferol, (DRISDOL) 50000 units CAPS capsule TAKE 1 CAPSULE BY MOUTH ONCE WEEKLY.   No facility-administered encounter medications on file as of 12/24/2017.     ALLERGIES:  No Known Allergies   PHYSICAL EXAM:  ECOG Performance status: 0  Vitals:   12/24/17 1440  BP: 122/73  Pulse: 95  Resp: 18  Temp: 98.5 F (36.9 C)  SpO2: 98%   Filed Weights   12/24/17 1440  Weight: 205 lb 4.8 oz (93.1 kg)      LABORATORY DATA:  I have reviewed the labs as listed.  CBC    Component Value Date/Time   WBC 15.5 (H) 12/09/2017 1214   RBC 4.55 12/09/2017 1214   HGB 13.8 12/09/2017 1214   HCT 42.0 12/09/2017 1214   PLT 351 12/09/2017 1214   MCV 92.3 12/09/2017 1214   MCH 30.3 12/09/2017 1214   MCHC 32.9 12/09/2017 1214   RDW 14.6 12/09/2017 1214   LYMPHSABS 6.7 12/09/2017 1214   MONOABS 1.0 12/09/2017 1214   EOSABS 0.4 12/09/2017 1214   BASOSABS 0.1 12/09/2017 1214   CMP Latest Ref Rng & Units 07/21/2017 02/19/2017 06/20/2014  Glucose 65 - 139 mg/dL 91 161(W) 960(A)  BUN 7 - 25 mg/dL 5(L) 6(L) 9  Creatinine 0.50 - 1.10 mg/dL 5.40 9.81 1.91  Sodium  135 - 146 mmol/L 137 140 141  Potassium 3.5 - 5.3 mmol/L 4.4 4.0 3.9  Chloride 98 - 110 mmol/L 103 110 104  CO2 20 - 32 mmol/L 29  23 26  Calcium 8.6 - 10.2 mg/dL 9.7 9.1 9.5  Total Protein 6.1 - 8.1 g/dL 7.1 6.4 -  Total Bilirubin 0.2 - 1.2 mg/dL 0.3 0.3 -  Alkaline Phos 33 - 115 U/L - 97 -  AST 10 - 35 U/L 13 10 -  ALT 6 - 29 U/L 9 8 -       ASSESSMENT & PLAN:   Lymphocytosis 1.  Leukocytosis: We have repeated her CBC at last visit.  White count was 15.7.  There was no elevation in the neutrophils or lymphocytes.  Flow cytometry was completely within normal limits.  No lymphoproliferative disorders were identified.  LDH was not elevated.  she does not have any B symptoms.  No palpable lymphadenopathy or splenomegaly.  I have recommended checking her blood for Jak 2 mutation and BCR/ABL by FISH.  This will rule out any underlying myeloproliferative disorders.  We will see her back in 3 weeks for follow-up.  If both the tests are negative, the most likely explanation is smoking induced leukocytosis.      Orders placed this encounter:  Orders Placed This Encounter  Procedures  . JAK2 genotypr      Doreatha Massed, MD [facility] [phone]

## 2018-01-02 ENCOUNTER — Encounter (HOSPITAL_COMMUNITY): Payer: Self-pay | Admitting: Hematology

## 2018-01-02 ENCOUNTER — Ambulatory Visit (HOSPITAL_COMMUNITY): Payer: Self-pay | Admitting: Psychiatry

## 2018-01-07 ENCOUNTER — Other Ambulatory Visit: Payer: Self-pay | Admitting: Family Medicine

## 2018-01-08 LAB — TISSUE HYBRIDIZATION TO NCBH

## 2018-01-12 ENCOUNTER — Telehealth (HOSPITAL_COMMUNITY): Payer: Self-pay | Admitting: *Deleted

## 2018-01-12 NOTE — Telephone Encounter (Signed)
Dr Vanetta Shawl Patient called her PCP has relocated & she has appointment with new PCP on 01/14/18. She is asking if you could refill her Gabapentin she's been without since Saturday & she takes  Them 3 x/ daily. Patient stated she is starting to fill the effects of having missed doses

## 2018-01-12 NOTE — Telephone Encounter (Signed)
Advise her to contact her PCP office to get refill as it has been prescribed by PCP.

## 2018-01-15 ENCOUNTER — Ambulatory Visit (HOSPITAL_COMMUNITY): Payer: Medicaid Other | Admitting: Psychiatry

## 2018-01-16 ENCOUNTER — Other Ambulatory Visit: Payer: Self-pay

## 2018-01-16 ENCOUNTER — Encounter (HOSPITAL_COMMUNITY): Payer: Self-pay | Admitting: Hematology

## 2018-01-16 ENCOUNTER — Inpatient Hospital Stay (HOSPITAL_COMMUNITY): Payer: Medicaid Other | Attending: Hematology | Admitting: Hematology

## 2018-01-16 ENCOUNTER — Inpatient Hospital Stay (HOSPITAL_COMMUNITY): Payer: Medicaid Other

## 2018-01-16 VITALS — BP 129/86 | HR 70 | Temp 98.5°F | Resp 20 | Ht 67.0 in | Wt 206.0 lb

## 2018-01-16 DIAGNOSIS — D72829 Elevated white blood cell count, unspecified: Secondary | ICD-10-CM

## 2018-01-16 DIAGNOSIS — D72820 Lymphocytosis (symptomatic): Secondary | ICD-10-CM | POA: Insufficient documentation

## 2018-01-16 NOTE — Progress Notes (Signed)
This patient was briefly seen today.  blood work for [employer] 2 mutation was not drawn accurately at last visit.  We will send a repeat test today.  Hence I did not charge her for this visit.

## 2018-01-16 NOTE — Patient Instructions (Signed)
[facility] at [facility] Discharge Instructions  Today you saw Dr. K.   Thank you for choosing [facility] at [facility] to provide your oncology and hematology care.  To afford each patient quality time with our provider, please arrive at least 15 minutes before your scheduled appointment time.   If you have a lab appointment with the Cancer Center please come in thru the  Main Entrance and check in at the main information desk  You need to re-schedule your appointment should you arrive 10 or more minutes late.  We strive to give you quality time with our providers, and arriving late affects you and other patients whose appointments are after yours.  Also, if you no show three or more times for appointments you may be dismissed from the clinic at the providers discretion.     Again, thank you for choosing  Cancer Center.  Our hope is that these requests will decrease the amount of time that you wait before being seen by our physicians.       _____________________________________________________________  Should you have questions after your visit to  Cancer Center, please contact our office at [phone] between the hours of 8:30 a.m. and 4:30 p.m.  Voicemails left after 4:30 p.m. will not be returned until the following business day.  For prescription refill requests, have your pharmacy contact our office.       Resources For Cancer Patients and their Caregivers ? American Cancer Society: Can assist with transportation, wigs, general needs, runs Look Good Feel Better.        1-888-227-6333 ? Cancer Care: Provides financial assistance, online support groups, medication/co-pay assistance.  1-800-813-HOPE (4673) ? [facility] Assists [place] cancer patients and their families through emotional , educational and financial support.  336-427-4357 ? [place] DSS Where to apply for food  stamps, Medicaid and utility assistance. 336-342-1394 ? [facility]: Transportation to medical appointments. 336-347-2287 ? Social Security Administration: May apply for disability if have a Stage IV cancer. 336-342-7796 1-800-772-1213 ? [place] Aging, Disability and Transit Services: Assists with nutrition, care and transit needs. 336-349-2343  Cancer Center Support Programs:   > Cancer Support Group  2nd Tuesday of the month 1pm-2pm, Journey Room   > Creative Journey  3rd Tuesday of the month 1130am-1pm, Journey Room    

## 2018-01-22 LAB — JAK2 GENOTYPR
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
## 2018-01-29 ENCOUNTER — Ambulatory Visit (INDEPENDENT_AMBULATORY_CARE_PROVIDER_SITE_OTHER): Payer: Medicaid Other | Admitting: Psychiatry

## 2018-01-29 ENCOUNTER — Encounter (HOSPITAL_COMMUNITY): Payer: Self-pay | Admitting: Hematology

## 2018-01-29 ENCOUNTER — Inpatient Hospital Stay (HOSPITAL_COMMUNITY): Payer: Medicaid Other | Attending: Hematology | Admitting: Hematology

## 2018-01-29 ENCOUNTER — Ambulatory Visit (HOSPITAL_COMMUNITY): Payer: Self-pay | Admitting: Hematology

## 2018-01-29 ENCOUNTER — Other Ambulatory Visit: Payer: Self-pay

## 2018-01-29 ENCOUNTER — Encounter (HOSPITAL_COMMUNITY): Payer: Self-pay | Admitting: Psychiatry

## 2018-01-29 VITALS — BP 126/72 | HR 84 | Temp 98.7°F | Resp 16 | Wt 205.4 lb

## 2018-01-29 DIAGNOSIS — F17210 Nicotine dependence, cigarettes, uncomplicated: Secondary | ICD-10-CM | POA: Insufficient documentation

## 2018-01-29 DIAGNOSIS — D72820 Lymphocytosis (symptomatic): Secondary | ICD-10-CM | POA: Diagnosis present

## 2018-01-29 DIAGNOSIS — F331 Major depressive disorder, recurrent, moderate: Secondary | ICD-10-CM

## 2018-01-29 DIAGNOSIS — D72828 Other elevated white blood cell count: Secondary | ICD-10-CM | POA: Diagnosis not present

## 2018-01-29 DIAGNOSIS — D72829 Elevated white blood cell count, unspecified: Secondary | ICD-10-CM
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
## 2018-01-29 NOTE — Progress Notes (Signed)
[facility] [address] [place] [address]   CLINIC:  Medical Oncology/Hematology  PCP:  Benita Stabile, MD [address] [place]ure[place] [address] [phone]   REASON FOR VISIT:  Follow-up for leukocytosis.   INTERVAL HISTORY:  Tiffany Morgan 47 y.o. female returns for follow-up of leukocytosis.    Recent labs reviewed. Her JAK2 and BCR-ABL testing are both negative.  Shared with her that we think her leukocytosis is secondary to smoking.  Encouraged smoking cessation.   No chronic steroid use. No history of infections.No history of splenectomy.     REVIEW OF SYSTEMS:  Review of Systems  Constitutional: Positive for fatigue.  All other systems reviewed and are negative.    PAST MEDICAL/SURGICAL HISTORY:  Past Medical History:  Diagnosis Date  . Anxiety   . Arthritis    spine  . Chronic back pain    started about 6 years ago - four bulging discs  . Depression   . Essential hypertension 08/22/2017   Past Surgical History:  Procedure Laterality Date  . BREAST BIOPSY Left 01/31/2016   benign  . BREAST BIOPSY Left 08/17/2013   benign  . CESAREAN SECTION     3  . TUBAL LIGATION       SOCIAL HISTORY:  Social History   Socioeconomic History  . Marital status: Single    Spouse name: Not on file  . Number of children: 2  . Years of education: 56  . Highest education level: Not on file  Occupational History  . 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 Financial resource strain: Not on file  . Food insecurity:    Worry: Not on file    Inability: Not on file  . Transportation needs:    Medical: Not on file    Non-medical: Not on file  Tobacco Use  . Smoking status: Current Every Day Smoker    Packs/day: 0.50    Years: 23.00    Pack years: 11.50    Types: Cigarettes    Start date: 09/17/1995  . Smokeless tobacco: Never Used  . Tobacco comment: trying to quit  Substance and Sexual Activity  . Alcohol use: No  .  Drug use: No  . Sexual activity: Not Currently    Birth control/protection: Surgical  Lifestyle  . Physical activity:    Days per week: Not on file    Minutes per session: Not on file  . Stress: Not on file  Relationships  . Social connections:    Talks on phone: Not on file    Gets together: Not on file    Attends religious service: Not on file    Active member of club or organization: Not on file    Attends meetings of clubs or organizations: Not on file    Relationship status: Not on file  . Intimate partner violence:    Fear of current or ex partner: Not on file    Emotionally abused: Not on file    Physically abused: Not on file    Forced sexual activity: Not on file  Other Topics Concern  . Not on file  Social History Narrative   Single   Lives with [name]   Feels unable to work due to back pain    FAMILY HISTORY:  Family History  Problem Relation Age of Onset  . Hypertension Mother   . Depression Mother   . Hyperlipidemia Mother   .  Heart disease Mother   . Heart attack Mother   . Cancer Father        pancreatic  . Mental illness Sister        schizoid  . Diabetes Sister   . Schizophrenia Sister   . Cancer Maternal Grandmother        myleodysplastic syndrome  . Depression Maternal Grandmother   . Myelodysplastic syndrome Maternal Grandmother   . Diabetes Maternal Grandmother   . Hypertension Maternal Grandmother   . Alcohol abuse Maternal Grandfather     CURRENT MEDICATIONS:  Outpatient Encounter Medications as of 01/29/2018  Medication Sig  . busPIRone (BUSPAR) 5 MG tablet Take 1 tablet (5 mg total) by mouth 3 (three) times daily.  . ciclopirox (PENLAC) 8 % solution Apply topically at bedtime. Apply over nail and surrounding skin. Apply daily over previous coat. After seven (7) days, may remove with alcohol and continue cycle.  . etodolac (LODINE) 400 MG tablet Take 1 tablet (400 mg total) 2 (two) times daily by mouth.  . etodolac (LODINE) 400 MG  tablet TAKE 1 TABLET BY MOUTH TWICE DAILY.  Marland Kitchen gabapentin (NEURONTIN) 300 MG capsule TAKE 1 CAPSULE BY MOUTH THREE TIMES DAILY.  Marland Kitchen LORazepam (ATIVAN) 0.5 MG tablet Take 1 tablet (0.5 mg total) by mouth daily as needed for anxiety.  . metoprolol succinate (TOPROL-XL) 25 MG 24 hr tablet Take 1 tablet (25 mg total) daily by mouth.  Marland Kitchen tiZANidine (ZANAFLEX) 4 MG tablet Take 1 tablet (4 mg total) by mouth 3 (three) times daily.  Marland Kitchen tiZANidine (ZANAFLEX) 4 MG tablet TAKE 1 TABLET BY MOUTH THREE TIMES DAILY AS NEEDED FOR MUSCLE SPASMS.  Marland Kitchen traZODone (DESYREL) 50 MG tablet 25-50 mg at night as needed for sleep  . venlafaxine XR (EFFEXOR-XR) 150 MG 24 hr capsule Take 1 capsule (150 mg total) by mouth daily with breakfast.  . [DISCONTINUED] Vitamin D, Ergocalciferol, (DRISDOL) 50000 units CAPS capsule TAKE 1 CAPSULE BY MOUTH ONCE WEEKLY. (Patient not taking: Reported on 01/29/2018)   No facility-administered encounter medications on file as of 01/29/2018.     ALLERGIES:  No Known Allergies   PHYSICAL EXAM:  ECOG Performance status: 0  Vitals:   01/29/18 1321  BP: 126/72  Pulse: 84  Resp: 16  Temp: 98.7 F (37.1 C)  SpO2: 100%   Filed Weights   01/29/18 1321  Weight: 205 lb 6.4 oz (93.2 kg)      LABORATORY DATA:  I have reviewed the labs as listed.  CBC    Component Value Date/Time   WBC 15.5 (H) 12/09/2017 1214   RBC 4.55 12/09/2017 1214   HGB 13.8 12/09/2017 1214   HCT 42.0 12/09/2017 1214   PLT 351 12/09/2017 1214   MCV 92.3 12/09/2017 1214   MCH 30.3 12/09/2017 1214   MCHC 32.9 12/09/2017 1214   RDW 14.6 12/09/2017 1214   LYMPHSABS 6.7 12/09/2017 1214   MONOABS 1.0 12/09/2017 1214   EOSABS 0.4 12/09/2017 1214   BASOSABS 0.1 12/09/2017 1214   CMP Latest Ref Rng & Units 07/21/2017 02/19/2017 06/20/2014  Glucose 65 - 139 mg/dL 91 161(W) 960(A)  BUN 7 - 25 mg/dL 5(L) 6(L) 9  Creatinine 0.50 - 1.10 mg/dL 5.40 9.81 1.91  Sodium 135 - 146 mmol/L 137 140 141  Potassium 3.5 - 5.3  mmol/L 4.4 4.0 3.9  Chloride 98 - 110 mmol/L 103 110 104  CO2 20 - 32 mmol/L Calcium 8.6 - 10.2 mg/dL 9.7 9.1 9.5  Total Protein 6.1 - 8.1 g/dL 7.1 6.4 -  Total Bilirubin 0.2 - 1.2 mg/dL 0.3 0.3 -  Alkaline Phos 33 - 115 U/L - 97 -  AST 10 - 35 U/L 13 10 -  ALT 6 - 29 U/L 9 8 -       ASSESSMENT & PLAN:   Lymphocytosis 1.  Leukocytosis:  -It was a combination of neutrophilic leukocytosis and lymphocytosis. -Flow cytometry was negative for any lymphoproliferative disorder.  Further testing was also negative for BCR/ABL by FISH and Jak 2 V617F mutation, ruling out myeloproliferative disorders.  She does not have any B symptoms.  She does not have any palpable splenomegaly or lymphadenopathy.  At this time working diagnosis is leukocytosis from smoking.  No further testing is required at this time.  We will see her back in 6 months for follow-up.  We will repeat CBC with differential at that time.  If there is any significant changes will consider doing bone marrow aspiration and biopsy. -She will talk to her primary care doctor regarding various options for quitting smoking.       Orders placed this encounter:  Orders Placed This Encounter  Procedures  . CBC with Differential/Platelet      Doreatha Massed, MD [facility] [phone]

## 2018-01-29 NOTE — Progress Notes (Deleted)
[facility]  No care team member to display  DIAGNOSIS: No diagnosis found.   CHIEF COMPLIANT: Leukocytosis   INTERVAL HISTORY: Tiffany Morgan is a 47 y.o. female here for follow-up for leukocytosis.   Recent las  REVIEW OF SYSTEMS:   Constitutional: Denies fevers, chills or abnormal weight loss Eyes: Denies blurriness of vision Ears, nose, mouth, throat, and face: Denies mucositis or sore throat Respiratory: Denies cough, dyspnea or wheezes Cardiovascular: Denies palpitation, chest discomfort Gastrointestinal:  Denies nausea, heartburn or change in bowel habits Skin: Denies abnormal skin rashes Lymphatics: Denies new lymphadenopathy or easy bruising Neurological:Denies numbness, tingling or new weaknesses Behavioral/Psych: Mood is stable, no new changes  Extremities: No lower extremity edema All other systems were reviewed with the patient and are negative.  I have reviewed the past medical history, past surgical history, social history and family history with the patient and they are unchanged from previous note.  ALLERGIES:  has No Known Allergies.  MEDICATIONS:  Current Outpatient Medications  Medication Sig Dispense Refill  . busPIRone (BUSPAR) 5 MG tablet Take 1 tablet (5 mg total) by mouth 3 (three) times daily. 270 tablet 0  . ciclopirox (PENLAC) 8 % solution Apply topically at bedtime. Apply over nail and surrounding skin. Apply daily over previous coat. After seven (7) days, may remove with alcohol and continue cycle. 6.6 mL 0  . etodolac (LODINE) 400 MG tablet Take 1 tablet (400 mg total) 2 (two) times daily by mouth. 60 tablet 0  . etodolac (LODINE) 400 MG tablet TAKE 1 TABLET BY MOUTH TWICE DAILY. 60 tablet 0  . gabapentin (NEURONTIN) 300 MG capsule TAKE 1 CAPSULE BY MOUTH THREE TIMES DAILY. 90 capsule 0  . LORazepam (ATIVAN) 0.5 MG tablet Take 1 tablet (0.5 mg total) by mouth daily as needed for anxiety. 30 tablet 1  . metoprolol succinate  (TOPROL-XL) 25 MG 24 hr tablet Take 1 tablet (25 mg total) daily by mouth. 90 tablet 3  . tiZANidine (ZANAFLEX) 4 MG tablet Take 1 tablet (4 mg total) by mouth 3 (three) times daily. 90 tablet 0  . tiZANidine (ZANAFLEX) 4 MG tablet TAKE 1 TABLET BY MOUTH THREE TIMES DAILY AS NEEDED FOR MUSCLE SPASMS. 90 tablet 0  . traZODone (DESYREL) 50 MG tablet 25-50 mg at night as needed for sleep 90 tablet 0  . venlafaxine XR (EFFEXOR-XR) 150 MG 24 hr capsule Take 1 capsule (150 mg total) by mouth daily with breakfast. 90 capsule 0  . Vitamin D, Ergocalciferol, (DRISDOL) 50000 units CAPS capsule TAKE 1 CAPSULE BY MOUTH ONCE WEEKLY. 4 capsule 0   No current facility-administered medications for this visit.     PHYSICAL EXAMINATION: ECOG PERFORMANCE STATUS: {CHL ONC ECOG [phone]}  There were no vitals filed for this visit. There were no vitals filed for this visit.  GENERAL:alert, no distress and comfortable SKIN: skin color, texture, turgor are normal, no rashes or significant lesions EYES: normal, Conjunctiva are pink and non-injected, sclera clear OROPHARYNX:no mucositis, no erythema and lips, buccal mucosa, and tongue normal  NECK: supple, thyroid normal size, non-tender, without nodularity LYMPH:  no palpable lymphadenopathy in the cervical, axillary or inguinal LUNGS: clear to auscultation and percussion with normal breathing effort HEART: regular rate & rhythm and no murmurs and no lower extremity edema ABDOMEN:abdomen soft, non-tender and normal bowel sounds MUSCULOSKELETAL:no cyanosis of digits and no clubbing  EXTREMITIES: No lower extremity edema   LABORATORY DATA:  I have reviewed the data as listed CMP  Latest Ref Rng & Units 07/21/2017 02/19/2017 06/20/2014  Glucose 65 - 139 mg/dL 91 960(A) 540(J)  BUN 7 - 25 mg/dL 5(L) 6(L) 9  Creatinine 0.50 - 1.10 mg/dL 8.11 9.14 7.82  Sodium 135 - 146 mmol/L 137 140 141  Potassium 3.5 - 5.3 mmol/L 4.4 4.0 3.9  Chloride 98 - 110 mmol/L 103  110 104  CO2 20 - 32 mmol/L Calcium 8.6 - 10.2 mg/dL 9.7 9.1 9.5  Total Protein 6.1 - 8.1 g/dL 7.1 6.4 -  Total Bilirubin 0.2 - 1.2 mg/dL 0.3 0.3 -  Alkaline Phos 33 - 115 U/L - 97 -  AST 10 - 35 U/L 13 10 -  ALT 6 - 29 U/L 9 8 -   No results found for: NFA213   Lab Results  Component Value Date   WBC 15.5 (H) 12/09/2017   HGB 13.8 12/09/2017   HCT 42.0 12/09/2017   MCV 92.3 12/09/2017   PLT 351 12/09/2017   NEUTROABS 7.3 12/09/2017    ASSESSMENT & PLAN:  No problem-specific Assessment & Plan notes found for this encounter.        I spent *** minutes talking to the patient of which more than half was spent in counseling and coordination of care.  No orders of the defined types were placed in this encounter.  The patient has a good understanding of the overall plan. she agrees with it. she will call with any problems that may develop before the next visit here.  This note includes documentation from Lubertha Basque, NP, who was present during this patient's office visit and evaluation.  I have reviewed this note for its completeness and accuracy.  I have edited this note accordingly based on my findings and medical opinion.      Doreatha Massed, MD 01/29/18

## 2018-01-29 NOTE — Progress Notes (Deleted)
[facility]  Patient Care Team: Patient, No Pcp Per as PCP - General (General Practice)  DIAGNOSIS: No diagnosis found.   CHIEF COMPLIANT: Leukocytosis   INTERVAL HISTORY: Tiffany Morgan is a 47 y.o. female here for follow-up for leukocytosis.   Recent las  REVIEW OF SYSTEMS:   Constitutional: Denies fevers, chills or abnormal weight loss Eyes: Denies blurriness of vision Ears, nose, mouth, throat, and face: Denies mucositis or sore throat Respiratory: Denies cough, dyspnea or wheezes Cardiovascular: Denies palpitation, chest discomfort Gastrointestinal:  Denies nausea, heartburn or change in bowel habits Skin: Denies abnormal skin rashes Lymphatics: Denies new lymphadenopathy or easy bruising Neurological:Denies numbness, tingling or new weaknesses Behavioral/Psych: Mood is stable, no new changes  Extremities: No lower extremity edema All other systems were reviewed with the patient and are negative.  I have reviewed the past medical history, past surgical history, social history and family history with the patient and they are unchanged from previous note.  ALLERGIES:  has No Known Allergies.  MEDICATIONS:  Current Outpatient Medications  Medication Sig Dispense Refill  . busPIRone (BUSPAR) 5 MG tablet Take 1 tablet (5 mg total) by mouth 3 (three) times daily. 270 tablet 0  . ciclopirox (PENLAC) 8 % solution Apply topically at bedtime. Apply over nail and surrounding skin. Apply daily over previous coat. After seven (7) days, may remove with alcohol and continue cycle. 6.6 mL 0  . etodolac (LODINE) 400 MG tablet Take 1 tablet (400 mg total) 2 (two) times daily by mouth. 60 tablet 0  . etodolac (LODINE) 400 MG tablet TAKE 1 TABLET BY MOUTH TWICE DAILY. 60 tablet 0  . gabapentin (NEURONTIN) 300 MG capsule TAKE 1 CAPSULE BY MOUTH THREE TIMES DAILY. 90 capsule 0  . LORazepam (ATIVAN) 0.5 MG tablet Take 1 tablet (0.5 mg total) by mouth daily as needed for  anxiety. 30 tablet 1  . metoprolol succinate (TOPROL-XL) 25 MG 24 hr tablet Take 1 tablet (25 mg total) daily by mouth. 90 tablet 3  . tiZANidine (ZANAFLEX) 4 MG tablet Take 1 tablet (4 mg total) by mouth 3 (three) times daily. 90 tablet 0  . tiZANidine (ZANAFLEX) 4 MG tablet TAKE 1 TABLET BY MOUTH THREE TIMES DAILY AS NEEDED FOR MUSCLE SPASMS. 90 tablet 0  . traZODone (DESYREL) 50 MG tablet 25-50 mg at night as needed for sleep 90 tablet 0  . venlafaxine XR (EFFEXOR-XR) 150 MG 24 hr capsule Take 1 capsule (150 mg total) by mouth daily with breakfast. 90 capsule 0   No current facility-administered medications for this visit.     PHYSICAL EXAMINATION: ECOG PERFORMANCE STATUS: {CHL ONC ECOG ZO:[id number]}  Vitals:   01/29/18 1321  BP: 126/72  Pulse: 84  Resp: 16  Temp: 98.7 F (37.1 C)  SpO2: 100%   Filed Weights   01/29/18 1321  Weight: 205 lb 6.4 oz (93.2 kg)    GENERAL:alert, no distress and comfortable SKIN: skin color, texture, turgor are normal, no rashes or significant lesions EYES: normal, Conjunctiva are pink and non-injected, sclera clear OROPHARYNX:no mucositis, no erythema and lips, buccal mucosa, and tongue normal  NECK: supple, thyroid normal size, non-tender, without nodularity LYMPH:  no palpable lymphadenopathy in the cervical, axillary or inguinal LUNGS: clear to auscultation and percussion with normal breathing effort HEART: regular rate & rhythm and no murmurs and no lower extremity edema ABDOMEN:abdomen soft, non-tender and normal bowel sounds MUSCULOSKELETAL:no cyanosis of digits and no clubbing  EXTREMITIES: No lower extremity edema  LABORATORY DATA:  I have reviewed the data as listed CMP Latest Ref Rng & Units 07/21/2017 02/19/2017 06/20/2014  Glucose 65 - 139 mg/dL 91 474(Q) 595(G)  BUN 7 - 25 mg/dL 5(L) 6(L) 9  Creatinine 0.50 - 1.10 mg/dL 3.87 5.64 3.32  Sodium 135 - 146 mmol/L 137 140 141  Potassium 3.5 - 5.3 mmol/L 4.4 4.0 3.9  Chloride 98 -  110 mmol/L 103 110 104  CO2 20 - 32 mmol/L Calcium 8.6 - 10.2 mg/dL 9.7 9.1 9.5  Total Protein 6.1 - 8.1 g/dL 7.1 6.4 -  Total Bilirubin 0.2 - 1.2 mg/dL 0.3 0.3 -  Alkaline Phos 33 - 115 U/L - 97 -  AST 10 - 35 U/L 13 10 -  ALT 6 - 29 U/L 9 8 -   No results found for: RJJ884   Lab Results  Component Value Date   WBC 15.5 (H) 12/09/2017   HGB 13.8 12/09/2017   HCT 42.0 12/09/2017   MCV 92.3 12/09/2017   PLT 351 12/09/2017   NEUTROABS 7.3 12/09/2017    ASSESSMENT & PLAN:  No problem-specific Assessment & Plan notes found for this encounter.        I spent *** minutes talking to the patient of which more than half was spent in counseling and coordination of care.  No orders of the defined types were placed in this encounter.  The patient has a good understanding of the overall plan. she agrees with it. she will call with any problems that may develop before the next visit here.  This note includes documentation from Lubertha Basque, NP, who was present during this patient's office visit and evaluation.  I have reviewed this note for its completeness and accuracy.  I have edited this note accordingly based on my findings and medical opinion.      Doreatha Massed, MD 01/29/18

## 2018-01-29 NOTE — Patient Instructions (Addendum)
[facility] at [facility] Discharge Instructions  Today you saw Dr. Katragadda   Thank you for choosing [facility] to provide your oncology and hematology care.  To afford each patient quality time with our provider, please arrive at least 15 minutes before your scheduled appointment time.   If you have a lab appointment with the Cancer Center please come in thru the  Main Entrance and check in at the main information desk  You need to re-schedule your appointment should you arrive 10 or more minutes late.  We strive to give you quality time with our providers, and arriving late affects you and other patients whose appointments are after yours.  Also, if you no show three or more times for appointments you may be dismissed from the clinic at the providers discretion.     Again, thank you for choosing [facility].  Our hope is that these requests will decrease the amount of time that you wait before being seen by our physicians.       _____________________________________________________________  Should you have questions after your visit to [facility], please contact our office at [phone] between the hours of 8:30 a.m. and 4:30 p.m.  Voicemails left after 4:30 p.m. will not be returned until the following business day.  For prescription refill requests, have your pharmacy contact our office.       Resources For Cancer Patients and their Caregivers ? American Cancer Society: Can assist with transportation, wigs, general needs, runs Look Good Feel Better.        1-888-227-6333 ? Cancer Care: Provides financial assistance, online support groups, medication/co-pay assistance.  1-800-813-HOPE (4673) ? [facility] Assists [place] cancer patients and their families through emotional , educational and financial support.  336-427-4357 ? [place] DSS Where to apply for  food stamps, Medicaid and utility assistance. 336-342-1394 ? [facility]: Transportation to medical appointments. 336-347-2287 ? Social Security Administration: May apply for disability if have a Stage IV cancer. 336-342-7796 1-800-772-1213 ? [place] Aging, Disability and Transit Services: Assists with nutrition, care and transit needs. 336-349-2343  Cancer Center Support Programs:   > Cancer Support Group  2nd Tuesday of the month 1pm-2pm, Journey Room   > Creative Journey  3rd Tuesday of the month 1130am-1pm, Journey Room    

## 2018-01-29 NOTE — Assessment & Plan Note (Signed)
1.  Leukocytosis:  -It was a combination of neutrophilic leukocytosis and lymphocytosis. -Flow cytometry was negative for any lymphoproliferative disorder.  Further testing was also negative for BCR/ABL by FISH and Jak 2 V617F mutation, ruling out myeloproliferative disorders.  She does not have any B symptoms.  She does not have any palpable splenomegaly or lymphadenopathy.  At this time working diagnosis is leukocytosis from smoking.  No further testing is required at this time.  We will see her back in 6 months for follow-up.  We will repeat CBC with differential at that time.  If there is any significant changes will consider doing bone marrow aspiration and biopsy. -She will talk to her primary care doctor regarding various options for quitting smoking.

## 2018-02-02 ENCOUNTER — Encounter (HOSPITAL_COMMUNITY): Payer: Self-pay | Admitting: Psychiatry

## 2018-02-02 ENCOUNTER — Ambulatory Visit (INDEPENDENT_AMBULATORY_CARE_PROVIDER_SITE_OTHER): Payer: Medicaid Other | Admitting: Psychiatry

## 2018-02-02 VITALS — BP 115/76 | HR 80 | Ht 67.0 in | Wt 203.0 lb

## 2018-02-02 DIAGNOSIS — R450 Nervousness: Secondary | ICD-10-CM

## 2018-02-02 DIAGNOSIS — F17210 Nicotine dependence, cigarettes, uncomplicated: Secondary | ICD-10-CM

## 2018-02-02 DIAGNOSIS — F331 Major depressive disorder, recurrent, moderate: Secondary | ICD-10-CM | POA: Diagnosis not present

## 2018-02-02 DIAGNOSIS — F419 Anxiety disorder, unspecified: Secondary | ICD-10-CM

## 2018-02-02 DIAGNOSIS — F410 Panic disorder [episodic paroxysmal anxiety] without agoraphobia: Secondary | ICD-10-CM | POA: Diagnosis not present

## 2018-02-02 DIAGNOSIS — F1290 Cannabis use, unspecified, uncomplicated: Secondary | ICD-10-CM

## 2018-02-02 DIAGNOSIS — Z811 Family history of alcohol abuse and dependence: Secondary | ICD-10-CM

## 2018-02-02 DIAGNOSIS — Z818 Family history of other mental and behavioral disorders: Secondary | ICD-10-CM | POA: Diagnosis not present

## 2018-02-02 DIAGNOSIS — G4700 Insomnia, unspecified: Secondary | ICD-10-CM

## 2018-02-02 DIAGNOSIS — M549 Dorsalgia, unspecified: Secondary | ICD-10-CM | POA: Diagnosis not present

## 2018-02-02 MED ORDER — LORAZEPAM 0.5 MG PO TABS: 0.5000 mg | ORAL_TABLET | Freq: Every day | ORAL | 0 refills | Status: DC | PRN

## 2018-02-02 MED ORDER — TRAZODONE HCL 50 MG PO TABS: ORAL_TABLET | ORAL | 0 refills | Status: DC

## 2018-02-02 MED ORDER — VENLAFAXINE HCL ER 150 MG PO CP24: 150.0000 mg | ORAL_CAPSULE | Freq: Every day | ORAL | 0 refills | Status: DC

## 2018-02-02 MED ORDER — ARIPIPRAZOLE 2 MG PO TABS
2.0000 mg | ORAL_TABLET | Freq: Every day | ORAL | 0 refills | Status: DC
Start: 2018-02-02 — End: 2018-03-05

## 2018-02-02 MED ORDER — BUSPIRONE HCL 5 MG PO TABS: 5.0000 mg | ORAL_TABLET | Freq: Three times a day (TID) | ORAL | 0 refills | Status: DC

## 2018-02-02 NOTE — Patient Instructions (Signed)
1.Continuevenlafaxine150 mg daily (225 mg caused anxiety) 2. Start Abilify 2 mg daily  3. Continue Buspar 5 mg three times a day  4.Continue lorazepam0.5 mg daily as needed for anxiety 5.ContinueTrazodone25-50 mg at night as needed for sleep 6.Return to clinic in one month for 30 mins

## 2018-02-11 ENCOUNTER — Telehealth (HOSPITAL_COMMUNITY): Payer: Self-pay | Admitting: *Deleted

## 2018-02-11 ENCOUNTER — Ambulatory Visit (HOSPITAL_COMMUNITY): Payer: Self-pay | Admitting: Psychiatry

## 2018-02-11 NOTE — Telephone Encounter (Signed)
Please contact the patient and get more information.

## 2018-02-11 NOTE — Telephone Encounter (Signed)
Dr Vanetta Shawl Patient is confused, sweating, nauseated, bit of a h/a last 3 days, dry mouth, restless, shaking. Family has even noticed & stated a difference in patient.

## 2018-02-11 NOTE — Telephone Encounter (Signed)
Dr Vanetta Shawl  Patient called stating that on office visit 02/02/18 Abilify was prescribed. And she is having some bad side effects she left voice message requesting that you call her  @# [phone]

## 2018-02-11 NOTE — Telephone Encounter (Signed)
Discussed with the patient. She states that she is "alright." She notices a little confused and dry mouth, restlessness, insomnia. These started after starting Abilify. Likely akathesia with anticholinergic side effect, rather than serotonin syndrome. She is advised to discontinue abilify. She is advised to be seen at urgent care/ED if any worsening in symptoms despite discontinuation of medication. She will also contact the clinic if no improvement in her symptoms.

## 2018-02-16 ENCOUNTER — Other Ambulatory Visit: Payer: Self-pay | Admitting: *Deleted

## 2018-02-16 ENCOUNTER — Other Ambulatory Visit: Payer: Self-pay | Admitting: Adult Health Nurse Practitioner

## 2018-02-16 DIAGNOSIS — Z1231 Encounter for screening mammogram for malignant neoplasm of breast: Secondary | ICD-10-CM

## 2018-02-20 ENCOUNTER — Ambulatory Visit: Payer: Self-pay | Admitting: Family Medicine

## 2018-02-25 ENCOUNTER — Encounter (INDEPENDENT_AMBULATORY_CARE_PROVIDER_SITE_OTHER): Payer: Self-pay

## 2018-02-25 ENCOUNTER — Encounter: Payer: Self-pay | Admitting: Obstetrics and Gynecology

## 2018-02-25 ENCOUNTER — Ambulatory Visit: Payer: Medicaid Other | Admitting: Obstetrics and Gynecology

## 2018-02-25 ENCOUNTER — Other Ambulatory Visit (HOSPITAL_COMMUNITY)
Admission: RE | Admit: 2018-02-25 | Discharge: 2018-02-25 | Disposition: A | Discharge status: Home / Self Care | Payer: Medicaid Other | Source: Ambulatory Visit | Attending: Obstetrics and Gynecology | Admitting: Obstetrics and Gynecology

## 2018-02-25 VITALS — BP 131/80 | HR 91 | Ht 67.0 in | Wt 204.8 lb

## 2018-02-25 DIAGNOSIS — Z01419 Encounter for gynecological examination (general) (routine) without abnormal findings: Secondary | ICD-10-CM | POA: Insufficient documentation

## 2018-02-25 DIAGNOSIS — F419 Anxiety disorder, unspecified: Secondary | ICD-10-CM

## 2018-02-25 DIAGNOSIS — N920 Excessive and frequent menstruation with regular cycle: Secondary | ICD-10-CM

## 2018-02-25 DIAGNOSIS — N3946 Mixed incontinence: Secondary | ICD-10-CM

## 2018-02-25 DIAGNOSIS — Z124 Encounter for screening for malignant neoplasm of cervix: Secondary | ICD-10-CM | POA: Diagnosis not present
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
## 2018-02-26 ENCOUNTER — Telehealth: Payer: Self-pay | Admitting: Obstetrics and Gynecology

## 2018-02-26 NOTE — Telephone Encounter (Signed)
Pt states that Dr. Emelda FearFerguson told her that he was going to send in rx for overactive bladder. Nothing was sent. Will send note to Dr. Emelda FearFerguson to send prescription.

## 2018-02-27 LAB — CYTOLOGY - PAP
CHLAMYDIA, DNA PROBE: NEGATIVE
Diagnosis: NEGATIVE
HPV (WINDOPATH): NOT DETECTED
NEISSERIA GONORRHEA: NEGATIVE

## 2018-02-27 MED ORDER — OXYBUTYNIN CHLORIDE 5 MG PO TABS: 5.0000 mg | ORAL_TABLET | Freq: Two times a day (BID) | ORAL | 3 refills | Status: DC | PRN
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
## 2018-03-05 ENCOUNTER — Encounter (HOSPITAL_COMMUNITY): Payer: Self-pay | Admitting: Psychiatry

## 2018-03-05 ENCOUNTER — Ambulatory Visit (INDEPENDENT_AMBULATORY_CARE_PROVIDER_SITE_OTHER): Payer: Medicaid Other | Admitting: Psychiatry

## 2018-03-05 VITALS — BP 148/92 | HR 83 | Ht 67.0 in | Wt 203.0 lb

## 2018-03-05 DIAGNOSIS — F331 Major depressive disorder, recurrent, moderate: Secondary | ICD-10-CM

## 2018-03-05 MED ORDER — LORAZEPAM 0.5 MG PO TABS: 0.5000 mg | ORAL_TABLET | Freq: Every day | ORAL | 1 refills | Status: DC | PRN

## 2018-03-05 NOTE — Patient Instructions (Addendum)
1.Continuevenlafaxine150 mg daily 2. Discontinue Abilify 3.Continue Buspar5 mg three times a day  4.Continue lorazepam0.5 mg daily as needed for anxiety 5.ContinueTrazodone25-50 mg at night as needed for sleep 6.Return to clinic in two months for 30 mins

## 2018-03-10 ENCOUNTER — Other Ambulatory Visit: Payer: Self-pay | Admitting: Family Medicine

## 2018-03-10 ENCOUNTER — Encounter (HOSPITAL_COMMUNITY): Payer: Self-pay | Admitting: Psychiatry

## 2018-03-10 ENCOUNTER — Ambulatory Visit (INDEPENDENT_AMBULATORY_CARE_PROVIDER_SITE_OTHER): Payer: Medicaid Other | Admitting: Psychiatry

## 2018-03-10 DIAGNOSIS — F331 Major depressive disorder, recurrent, moderate: Secondary | ICD-10-CM | POA: Diagnosis not present
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
## 2018-03-11 ENCOUNTER — Ambulatory Visit: Payer: Self-pay

## 2018-03-25 ENCOUNTER — Encounter: Payer: Self-pay | Admitting: Obstetrics and Gynecology

## 2018-03-25 ENCOUNTER — Ambulatory Visit (HOSPITAL_COMMUNITY): Payer: Medicaid Other | Admitting: Psychiatry

## 2018-03-25 ENCOUNTER — Ambulatory Visit: Payer: Medicaid Other | Admitting: Obstetrics and Gynecology

## 2018-03-25 VITALS — BP 132/84 | HR 81 | Ht 67.0 in | Wt 200.8 lb

## 2018-03-25 DIAGNOSIS — N920 Excessive and frequent menstruation with regular cycle: Secondary | ICD-10-CM | POA: Diagnosis not present

## 2018-03-25 NOTE — Progress Notes (Addendum)
Patient ID: ONEDA GOWARD, female   DOB: 1971/05/08, 47 y.o.   MRN: [id number]    [facility] Clinic Visit  @DATE @            Patient name: Tiffany Morgan MRN [id number]  Date of birth: Nov 24, 1970  CC & HPI:  Tiffany Morgan is a 47 y.o. female presenting today for a F/U about her problems with her heavy periods that last 7 days. At her annual exam on 02/25/2018, she reported heavy periods accompanied by pain. Her next period is scheduled to start on 03/30/2018.  She also has urge incontinence and was started on oxybutynin 5mg  BID prn. She only takes it at night and says she loves the medication but feels like she is going to pass out whenever she is in the sun. She also takes Toprol at night for her BP and is unsure which medication is causing her symptoms..  She has begun taking both medications at night.  I suspect the Toprol is the more likely culprit and now that the lightheadedness when working in the sun has resolved she will attempt taking the oxybutynin again to see if she can sort out which medication was leading her to more lightheadedness  Her last pap on 02/25/2018 was negative. She has been diagnosed with MDD and takes lorazepam 0.5mg  once a day prn.The patient denies fever, chills or any other symptoms or complaints at this time.   ROS:  ROS  +menorrhagia  -fever -chills  All systems are negative except as noted in the HPI and PMH.   Pertinent History Reviewed:   Reviewed:  Medical         Past Medical History:  Diagnosis Date  . Anxiety   . Arthritis    spine  . Chronic back pain    started about 6 years ago - four bulging discs  . Depression   . Essential hypertension 08/22/2017                              Surgical Hx:    Past Surgical History:  Procedure Laterality Date  . BREAST BIOPSY Left 01/31/2016   benign  . BREAST BIOPSY Left 08/17/2013   benign  . CESAREAN SECTION     3  . TUBAL LIGATION     Medications: Reviewed & Updated - see associated  section                       Current Outpatient Medications:  .  busPIRone (BUSPAR) 5 MG tablet, Take 1 tablet (5 mg total) by mouth 3 (three) times daily., Disp: 270 tablet, Rfl: 0 .  ciclopirox (PENLAC) 8 % solution, Apply topically at bedtime. Apply over nail and surrounding skin. Apply daily over previous coat. After seven (7) days, may remove with alcohol and continue cycle., Disp: 6.6 mL, Rfl: 0 .  etodolac (LODINE) 400 MG tablet, Take 1 tablet (400 mg total) 2 (two) times daily by mouth., Disp: 60 tablet, Rfl: 0 .  etodolac (LODINE) 400 MG tablet, TAKE 1 TABLET BY MOUTH TWICE DAILY., Disp: 60 tablet, Rfl: 0 .  gabapentin (NEURONTIN) 300 MG capsule, TAKE 1 CAPSULE BY MOUTH THREE TIMES DAILY., Disp: 90 capsule, Rfl: 0 .  LORazepam (ATIVAN) 0.5 MG tablet, Take 1 tablet (0.5 mg total) by mouth daily as needed for anxiety., Disp: 30 tablet, Rfl: 1 .  metoprolol succinate (TOPROL-XL) 25 MG 24  hr tablet, Take 1 tablet (25 mg total) daily by mouth., Disp: 90 tablet, Rfl: 3 .  oxybutynin (DITROPAN) 5 MG tablet, Take 1 tablet (5 mg total) by mouth 2 (two) times daily as needed for bladder spasms., Disp: 60 tablet, Rfl: 3 .  tiZANidine (ZANAFLEX) 4 MG tablet, Take 1 tablet (4 mg total) by mouth 3 (three) times daily., Disp: 90 tablet, Rfl: 0 .  tiZANidine (ZANAFLEX) 4 MG tablet, TAKE 1 TABLET BY MOUTH THREE TIMES DAILY AS NEEDED FOR MUSCLE SPASMS., Disp: 90 tablet, Rfl: 0 .  traZODone (DESYREL) 50 MG tablet, 25-50 mg at night as needed for sleep, Disp: 90 tablet, Rfl: 0 .  venlafaxine XR (EFFEXOR-XR) 150 MG 24 hr capsule, Take 1 capsule (150 mg total) by mouth daily with breakfast., Disp: 90 capsule, Rfl: 0   Social History: Reviewed -  reports that she has been smoking cigarettes.  She started smoking about 22 years ago. She has a 11.50 pack-year smoking history. She has never used smokeless tobacco.  Objective Findings:  Vitals: There were no vitals taken for this visit.  PHYSICAL  EXAMINATION General appearance - alert, well appearing, and in no distress and oriented to person, place, and time Mental status - alert, oriented to person, place, and time, normal mood, behavior, speech, dress, motor activity, and thought processes, affect appropriate to mood  PELVIC DEFERRED  Discussed with pt risks and benefits of IUDs and endometrial ablation.  Plans are at this point to attempt IUD use.  at end of discussion, pt had opportunity to ask questions and has no further questions at this time.   Greater than 50% was spent in counseling and coordination of care with the patient. Total time greater than: 10 minutes   Assessment & Plan:   A:  1.  Menorrhagia, we'll suppress with IUD  P:  1. Continue Rx Oxybutynin 5mg  BID prn 2. Transvaginal U/S in the next week 3. Get IUD on 04/03/2017 or as scheduled   By signing my name below, I, Pietro Cassis, attest that this documentation has been prepared under the direction and in the presence of Tilda Burrow, MD. Electronically Signed: Pietro Cassis, Medical Scribe. 03/25/18. 10:49 AM.  I personally performed the services described in this documentation, which was SCRIBED in my presence. The recorded information has been reviewed and considered accurate. It has been edited as necessary during review. Tilda Burrow, MD

## 2018-03-27 ENCOUNTER — Other Ambulatory Visit: Payer: Self-pay | Admitting: Obstetrics and Gynecology

## 2018-03-27 DIAGNOSIS — N920 Excessive and frequent menstruation with regular cycle: Secondary | ICD-10-CM

## 2018-03-30 ENCOUNTER — Other Ambulatory Visit: Payer: Medicaid Other

## 2018-04-06 ENCOUNTER — Encounter: Payer: Self-pay | Admitting: Women's Health

## 2018-04-06 ENCOUNTER — Ambulatory Visit (INDEPENDENT_AMBULATORY_CARE_PROVIDER_SITE_OTHER): Payer: Medicaid Other | Admitting: Women's Health

## 2018-04-06 VITALS — BP 136/79 | HR 100 | Ht 67.0 in | Wt 197.6 lb

## 2018-04-06 DIAGNOSIS — Z3202 Encounter for pregnancy test, result negative: Secondary | ICD-10-CM

## 2018-04-06 DIAGNOSIS — Z30430 Encounter for insertion of intrauterine contraceptive device: Secondary | ICD-10-CM | POA: Diagnosis not present

## 2018-04-06 DIAGNOSIS — N920 Excessive and frequent menstruation with regular cycle: Secondary | ICD-10-CM

## 2018-04-06 NOTE — Patient Instructions (Addendum)
Endometrial Ablation Endometrial ablation is a procedure that destroys the thin inner layer of the lining of the uterus (endometrium). This procedure may be done:  To stop heavy periods.  To stop bleeding that is causing anemia.  To control irregular bleeding.  To treat bleeding caused by small tumors (fibroids) in the endometrium.  This procedure is often an alternative to major surgery, such as removal of the uterus and cervix (hysterectomy). As a result of this procedure:  You may not be able to have children. However, if you are premenopausal (you have not gone through menopause): ? You may still have a small chance of getting pregnant. ? You will need to use a reliable method of birth control after the procedure to prevent pregnancy.  You may stop having a menstrual period, or you may have only a small amount of bleeding during your period. Menstruation may return several years after the procedure.  Tell a health care provider about:  Any allergies you have.  All medicines you are taking, including vitamins, herbs, eye drops, creams, and over-the-counter medicines.  Any problems you or family members have had with the use of anesthetic medicines.  Any blood disorders you have.  Any surgeries you have had.  Any medical conditions you have. What are the risks? Generally, this is a safe procedure. However, problems may occur, including:  A hole (perforation) in the uterus or bowel.  Infection of the uterus, bladder, or vagina.  Bleeding.  Damage to other structures or organs.  An air bubble in the lung (air embolus).  Problems with pregnancy after the procedure.  Failure of the procedure.  Decreased ability to diagnose cancer in the endometrium.  What happens before the procedure?  You will have tests of your endometrium to make sure there are no pre-cancerous cells or cancer cells present.  You may have an ultrasound of the uterus.  You may be given  medicines to thin the endometrium.  Ask your health care provider about: ? Changing or stopping your regular medicines. This is especially important if you take diabetes medicines or blood thinners. ? Taking medicines such as aspirin and ibuprofen. These medicines can thin your blood. Do not take these medicines before your procedure if your doctor tells you not to.  Plan to have someone take you home from the hospital or clinic. What happens during the procedure?  You will lie on an exam table with your feet and legs supported as in a pelvic exam.  To lower your risk of infection: ? Your health care team will wash or sanitize their hands and put on germ-free (sterile) gloves. ? Your genital area will be washed with soap.  An IV tube will be inserted into one of your veins.  You will be given a medicine to help you relax (sedative).  A surgical instrument with a light and camera (resectoscope) will be inserted into your vagina and moved into your uterus. This allows your surgeon to see inside your uterus.  Endometrial tissue will be removed using one of the following methods: ? Radiofrequency. This method uses a radiofrequency-alternating electric current to remove the endometrium. ? Cryotherapy. This method uses extreme cold to freeze the endometrium. ? Heated-free liquid. This method uses a heated saltwater (saline) solution to remove the endometrium. ? Microwave. This method uses high-energy microwaves to heat up the endometrium and remove it. ? Thermal balloon. This method involves inserting a catheter with a balloon tip into the uterus. The balloon tip is   filled with heated fluid to remove the endometrium. The procedure may vary among health care providers and hospitals. What happens after the procedure?  Your blood pressure, heart rate, breathing rate, and blood oxygen level will be monitored until the medicines you were given have worn off.  As tissue healing occurs, you may  notice vaginal bleeding for 4-6 weeks after the procedure. You may also experience: ? Cramps. ? Thin, watery vaginal discharge that is light pink or brown in color. ? A need to urinate more frequently than usual. ? Nausea.  Do not drive for 24 hours if you were given a sedative.  Do not have sex or insert anything into your vagina until your health care provider approves. Summary  Endometrial ablation is done to treat the many causes of heavy menstrual bleeding.  The procedure may be done only after medications have been tried to control the bleeding.  Plan to have someone take you home from the hospital or clinic. This information is not intended to replace advice given to you by your health care provider. Make sure you discuss any questions you have with your health care provider. Document Released: 07/12/2004 Document Revised: 09/19/2016 Document Reviewed: 09/19/2016 Elsevier Interactive Patient Education  2017 [employer]  

## 2018-04-06 NOTE — Progress Notes (Signed)
IUD INSERTION Patient name: Tiffany Morgan MRN [id number]  Date of birth: [birth year] Subjective Findings:   Tiffany Morgan is a 47 y.o. [phone] African American female being seen today for insertion of a Liletta IUD for heavy period management. Has had BTL and c/s x 3.    Patient's last menstrual period was 03/31/2018. Last pap6/12/19. Results were:  negw / -HRHPV  The risks and benefits of the method and placement have been thouroughly reviewed with the patient and all questions were answered.  Specifically the patient is aware of failure rate of 09/998, expulsion of the IUD and of possible perforation.  The patient is aware of irregular bleeding due to the method and understands the incidence of irregular bleeding diminishes with time.  Signed copy of informed consent in chart.  Pertinent History Reviewed:   Reviewed past medical,surgical, social, obstetrical and family history.  Reviewed problem list, medications and allergies. Objective Findings & Procedure:   Vitals:   04/06/18 1150  BP: 136/79  Pulse: 100  Weight: 197 lb 9.6 oz (89.6 kg)  Height: 5\' 7"  (1.702 m)  Body mass index is 30.95 kg/m.  No results found for this or any previous visit (from the past 24 hour(s)).   Time out was performed.  A graves speculum was placed in the vagina.  The cervix was visualized, prepped using Betadine, and grasped with a single tooth tenaculum. I was unable to pass even the smallest dilator through her cervix, LHE in to try, cx too stenotic, dilators will not pass. Pt does not desire further attempts.   Assessment & Plan:   1) Encounter for IUD insertion-NOT inserted, cervix too stenotic. Plan pelvic u/s after next period, f/u w/ JVF to discuss further options/possible ablation   Orders Placed This Encounter  Procedures  . US PELVIS (TRANSABDOMINAL ONLY)  . US PELVIS TRANSVANGINAL NON-OB (TV ONLY)  . POCT urine pregnancy    Return in about 1 month (around 05/04/2018) for pelvic  u/s then f/u w/ JVF.  JERYL SCIULLO CNM, [facility] 04/06/2018 12:42 PM

## 2018-04-07 ENCOUNTER — Other Ambulatory Visit (HOSPITAL_COMMUNITY): Payer: Self-pay | Admitting: Internal Medicine

## 2018-04-07 DIAGNOSIS — Z1231 Encounter for screening mammogram for malignant neoplasm of breast: Secondary | ICD-10-CM

## 2018-04-08 ENCOUNTER — Ambulatory Visit (INDEPENDENT_AMBULATORY_CARE_PROVIDER_SITE_OTHER): Payer: Medicaid Other | Admitting: Psychiatry

## 2018-04-08 DIAGNOSIS — F331 Major depressive disorder, recurrent, moderate: Secondary | ICD-10-CM | POA: Diagnosis not present
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
## 2018-04-10 ENCOUNTER — Ambulatory Visit: Payer: Self-pay

## 2018-04-22 ENCOUNTER — Encounter (HOSPITAL_COMMUNITY): Payer: Self-pay

## 2018-04-22 ENCOUNTER — Ambulatory Visit (HOSPITAL_COMMUNITY)
Admission: RE | Admit: 2018-04-22 | Discharge: 2018-04-22 | Disposition: A | Discharge status: Home / Self Care | Payer: Medicaid Other | Source: Ambulatory Visit | Attending: Internal Medicine | Admitting: Internal Medicine

## 2018-04-22 ENCOUNTER — Ambulatory Visit (INDEPENDENT_AMBULATORY_CARE_PROVIDER_SITE_OTHER): Payer: Medicaid Other | Admitting: Psychiatry

## 2018-04-22 DIAGNOSIS — F331 Major depressive disorder, recurrent, moderate: Secondary | ICD-10-CM | POA: Diagnosis not present

## 2018-04-22 DIAGNOSIS — Z1231 Encounter for screening mammogram for malignant neoplasm of breast: Secondary | ICD-10-CM
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
## 2018-04-28 ENCOUNTER — Ambulatory Visit: Payer: Self-pay | Admitting: Urology

## 2018-05-04 ENCOUNTER — Encounter: Payer: Self-pay | Admitting: Obstetrics and Gynecology

## 2018-05-04 ENCOUNTER — Ambulatory Visit: Payer: Medicaid Other | Admitting: Obstetrics and Gynecology

## 2018-05-04 ENCOUNTER — Emergency Department (HOSPITAL_COMMUNITY)
Admission: EM | Admit: 2018-05-04 | Discharge: 2018-05-04 | Disposition: A | Discharge status: Home / Self Care | Payer: Medicaid Other

## 2018-05-04 ENCOUNTER — Ambulatory Visit (INDEPENDENT_AMBULATORY_CARE_PROVIDER_SITE_OTHER): Payer: Medicaid Other

## 2018-05-04 VITALS — BP 110/71 | HR 64 | Ht 67.0 in | Wt 197.0 lb

## 2018-05-04 DIAGNOSIS — N920 Excessive and frequent menstruation with regular cycle: Secondary | ICD-10-CM | POA: Diagnosis not present

## 2018-05-04 DIAGNOSIS — N882 Stricture and stenosis of cervix uteri: Secondary | ICD-10-CM

## 2018-05-04 DIAGNOSIS — Q513 Bicornate uterus: Secondary | ICD-10-CM | POA: Diagnosis not present

## 2018-05-04 MED ORDER — NORETHINDRONE 0.35 MG PO TABS: 1.0000 | ORAL_TABLET | Freq: Every day | ORAL | 11 refills | Status: DC

## 2018-05-04 NOTE — Progress Notes (Addendum)
Patient ID: Tiffany Morgan, female   DOB: 14-Jul-1971, 47 y.o.   MRN: [id number]    [facility] Clinic Visit  @DATE @            Patient name: Tiffany Morgan MRN [id number]  Date of birth: [birth year]  CC & HPI:  Tiffany Morgan is a 47 y.o. female presenting today for f/u ultrasound. Her last period began 04/27/18 just ended today and has been very emotional, with back and leg pain, migraines; while smokingsmokes 1/2 pack a day but has been smoking less. She has no hx of blood clots or stroke, but is trying to get her cholesterol under control.   She had all her children c-section due to her children being breech. She tried IUD but did not take well due to cervical stenosis, and  bicornuate uterus.   ROS:  ROS +bicornuate uterus pt recalls that hx upon discussion +back pain +leg pain +migraines +menstrual mood swings -fever -chills All systems are negative except as noted in the HPI and PMH.   Pertinent History Reviewed:   Reviewed: Significant for c-section, tubal ligation. Medical         Past Medical History:  Diagnosis Date  . Anxiety   . Arthritis    spine  . Chronic back pain    started about 6 years ago - four bulging discs  . Depression   . Essential hypertension 08/22/2017                              Surgical Hx:    Past Surgical History:  Procedure Laterality Date  . BREAST BIOPSY Left 01/31/2016   benign  . BREAST BIOPSY Left 08/17/2013   benign  . CESAREAN SECTION     3  . TUBAL LIGATION     Medications: Reviewed & Updated - see associated section                       Current Outpatient Medications:  .  busPIRone (BUSPAR) 5 MG tablet, Take 1 tablet (5 mg total) by mouth 3 (three) times daily., Disp: 270 tablet, Rfl: 0 .  ciclopirox (PENLAC) 8 % solution, Apply topically at bedtime. Apply over nail and surrounding skin. Apply daily over previous coat. After seven (7) days, may remove with alcohol and continue cycle., Disp: 6.6 mL, Rfl: 0 .   gabapentin (NEURONTIN) 300 MG capsule, TAKE 1 CAPSULE BY MOUTH THREE TIMES DAILY., Disp: 90 capsule, Rfl: 0 .  LORazepam (ATIVAN) 0.5 MG tablet, Take 1 tablet (0.5 mg total) by mouth daily as needed for anxiety., Disp: 30 tablet, Rfl: 1 .  metoprolol succinate (TOPROL-XL) 25 MG 24 hr tablet, Take 1 tablet (25 mg total) daily by mouth., Disp: 90 tablet, Rfl: 3 .  oxybutynin (DITROPAN) 5 MG tablet, Take 1 tablet (5 mg total) by mouth 2 (two) times daily as needed for bladder spasms., Disp: 60 tablet, Rfl: 3 .  traZODone (DESYREL) 50 MG tablet, 25-50 mg at night as needed for sleep, Disp: 90 tablet, Rfl: 0 .  venlafaxine XR (EFFEXOR-XR) 150 MG 24 hr capsule, Take 1 capsule (150 mg total) by mouth daily with breakfast., Disp: 90 capsule, Rfl: 0 .  etodolac (LODINE) 400 MG tablet, Take 1 tablet (400 mg total) 2 (two) times daily by mouth. (Patient not taking: Reported on 05/04/2018), Disp: 60 tablet, Rfl: 0 .  UNABLE TO FIND, Muscle relaxer 750  mg BID prn-can't remember name., Disp: , Rfl:    Social History: Reviewed -  reports that she has been smoking cigarettes. She started smoking about 22 years ago. She has a 11.50 pack-year smoking history. She has never used smokeless tobacco.  Objective Findings:  Vitals: Blood pressure 110/71, pulse 64, height 5\' 7"  (1.702 m), weight 197 lb (89.4 kg), last menstrual period 04/28/2018.  PHYSICAL EXAMINATION General appearance - alert, well appearing, and in no distress and oriented to person, place, and time Mental status - alert, oriented to person, place, and time, normal mood, behavior, speech, dress, motor activity, and thought processes, affect appropriate to mood  PELVIC NOT DONE  Assessment & Plan:   A:  1. Cervical stenosis 2. Hx of C-section x2(?3) 3. Dysmenorrhea 4. Bicornuate uterus  P:  1.  Rx Micronor Begin today 2. Gyn:F/u 2 month    By signing my name below, I, Arnette Norris, attest that this documentation has been prepared under  the direction and in the presence of Tilda Burrow, MD. Electronically Signed: Arnette Norris Medical Scribe. 05/04/18. 9:59 AM.  I personally performed the services described in this documentation, which was SCRIBED in my presence. The recorded information has been reviewed and considered accurate. It has been edited as necessary during review. Tilda Burrow, MD

## 2018-05-04 NOTE — Progress Notes (Signed)
PELVIC US TA/TV:homogeneous bicornuate anteverted uterus,wnl,EEC right 5.6 mm,EEC left 5.4 mm,normal ovaries bilat,no free fluid,no pain during ultrasound,ovaries appear mobile,limited movement of left ovary because of ovary position

## 2018-05-05 ENCOUNTER — Emergency Department (HOSPITAL_COMMUNITY)
Admission: EM | Admit: 2018-05-05 | Discharge: 2018-05-05 | Disposition: A | Discharge status: Home / Self Care | Payer: Medicaid Other | Attending: Emergency Medicine | Admitting: Emergency Medicine

## 2018-05-05 ENCOUNTER — Other Ambulatory Visit: Payer: Self-pay

## 2018-05-05 ENCOUNTER — Emergency Department (HOSPITAL_COMMUNITY): Payer: Medicaid Other

## 2018-05-05 ENCOUNTER — Encounter (HOSPITAL_COMMUNITY): Payer: Self-pay

## 2018-05-05 DIAGNOSIS — R079 Chest pain, unspecified: Secondary | ICD-10-CM | POA: Diagnosis present

## 2018-05-05 DIAGNOSIS — F17210 Nicotine dependence, cigarettes, uncomplicated: Secondary | ICD-10-CM | POA: Insufficient documentation

## 2018-05-05 DIAGNOSIS — Z79899 Other long term (current) drug therapy: Secondary | ICD-10-CM | POA: Insufficient documentation

## 2018-05-05 DIAGNOSIS — I10 Essential (primary) hypertension: Secondary | ICD-10-CM | POA: Diagnosis not present

## 2018-05-05 LAB — CBC
HCT: 38.9 % (ref 36.0–46.0)
Hemoglobin: 13 g/dL (ref 12.0–15.0)
MCH: 31 pg (ref 26.0–34.0)
MCHC: 33.4 g/dL (ref 30.0–36.0)
MCV: 92.6 fL (ref 78.0–100.0)
PLATELETS: 369 10*3/uL (ref 150–400)
RBC: 4.2 MIL/uL (ref 3.87–5.11)
RDW: 15.4 % (ref 11.5–15.5)
WBC: 13.2 10*3/uL — ABNORMAL HIGH (ref 4.0–10.5)

## 2018-05-05 LAB — TROPONIN I

## 2018-05-05 LAB — BASIC METABOLIC PANEL
Anion gap: 7 (ref 5–15)
BUN: 7 mg/dL (ref 6–20)
CALCIUM: 9.1 mg/dL (ref 8.9–10.3)
CO2: 26 mmol/L (ref 22–32)
CREATININE: 0.78 mg/dL (ref 0.44–1.00)
Chloride: 104 mmol/L (ref 98–111)
GFR calc Af Amer: >60 mL/min (ref >60)
GLUCOSE: 115 mg/dL — AB (ref 70–99)
Potassium: 3.4 mmol/L — ABNORMAL LOW (ref 3.5–5.1)
Sodium: 137 mmol/L (ref 135–145)

## 2018-05-05 LAB — HCG, QUANTITATIVE, PREGNANCY

## 2018-05-05 MED ORDER — PANTOPRAZOLE SODIUM 20 MG PO TBEC: 20.0000 mg | DELAYED_RELEASE_TABLET | Freq: Every day | ORAL | 1 refills | Status: DC

## 2018-05-05 MED ORDER — GI COCKTAIL ~~LOC~~
30.0000 mL | Freq: Once | ORAL | Status: AC
  Administered 2018-05-05: 30 mL via ORAL
  Filled 2018-05-05: qty 30

## 2018-05-05 NOTE — ED Provider Notes (Signed)
[facility] EMERGENCY DEPARTMENT Provider Note   CSN: [id number] Arrival date & time: 05/05/18  1511     History   Chief Complaint Chief Complaint  Patient presents with  . Chest Pain    HPI Tiffany Morgan is a 47 y.o. female.  Intermittent chest pain for 1 month described as tightness.  Episodes last for 5 to 10 minutes several times per day and are not associated with any activity.  She admits to increased stress in her life lately.  She states dyspnea, dizziness, diaphoresis.  No nausea.  Past medical history includes hypertension and cigarette smoking.  No diabetes.  Mother had an MI in her early 5860s.  She has taken nothing for pain.  Severity is mild.     Past Medical History:  Diagnosis Date  . Anxiety   . Arthritis    spine  . Chronic back pain    started about 6 years ago - four bulging discs  . Depression   . Essential hypertension 08/22/2017    Patient Active Problem List   Diagnosis Date Noted  . 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 Menorrhagia with regular cycle 03/25/2018  . Lymphocytosis 12/09/2017  . Essential hypertension 08/22/2017  . MDD (major depressive disorder), recurrent episode, moderate (HCC) 07/01/2017  . Major depressive disorder 04/02/2017  . GAD (generalized anxiety disorder) 04/02/2017  . Panic disorder 04/02/2017  . Vitamin D deficiency 02/21/2017  . Tobacco abuse 01/16/2017  . Atherosclerosis of aorta (HCC) 01/16/2017  . Degenerative arthritis of spine 01/16/2017  . Chronic midline back pain 01/16/2017  . Generalized social phobia 01/16/2017  . Overweight 01/16/2017    Past Surgical History:  Procedure Laterality Date  . BREAST BIOPSY Left 01/31/2016   benign  . BREAST BIOPSY Left 08/17/2013   benign  . CESAREAN SECTION     3  . TUBAL LIGATION       OB History    Gravida  5   Para  2   Term  2   Preterm      AB  3   Living  2     SAB      TAB  3   Ectopic      Multiple      Live Births  2        Obstetric Comments  Cesarean section x 2, due to bicornuate uterus and fetal malpresntaton BREECH, THEN REPEAT         Home Medications    Prior to Admission medications   Medication Sig Start Date End Date Taking? Authorizing Provider  busPIRone (BUSPAR) 5 MG tablet Take 1 tablet (5 mg total) by mouth 3 (three) times daily. 02/02/18  Yes Hisada, Barbee Cougheina, MD  ciclopirox (PENLAC) 8 % solution Apply topically at bedtime. Apply over nail and surrounding skin. Apply daily over previous coat. After seven (7) days, may remove with alcohol and continue cycle. 03/21/17  Yes Eustace MooreNelson, Yvonne Sue, MD  gabapentin (NEURONTIN) 300 MG capsule TAKE 1 CAPSULE BY MOUTH THREE TIMES DAILY. 12/10/17  Yes Eustace MooreNelson, Yvonne Sue, MD  LORazepam (ATIVAN) 0.5 MG tablet Take 1 tablet (0.5 mg total) by mouth daily as needed for anxiety. 03/05/18  Yes Neysa HotterHisada, Reina, MD  methocarbamol (ROBAXIN) 750 MG tablet Take 750 mg by mouth 2 (two) times daily as needed for muscle spasms.   Yes [provider]  metoprolol tartrate (LOPRESSOR) 25 MG tablet Take 25 mg by mouth daily.   Yes [provider]  norethindrone Julian Hy(MICRONOR,CAMILA,ERRIN)  0.35 MG tablet Take 1 tablet by mouth daily.   Yes [provider]  oxybutynin (DITROPAN) 5 MG tablet Take 1 tablet (5 mg total) by mouth 2 (two) times daily as needed for bladder spasms. 02/27/18  Yes Tilda Burrow, MD  traZODone (DESYREL) 50 MG tablet 25-50 mg at night as needed for sleep Patient taking differently: Take 25-50 mg by mouth at bedtime.  02/02/18  Yes [name], MD  venlafaxine XR (EFFEXOR-XR) 150 MG 24 hr capsule Take 1 capsule (150 mg total) by mouth daily with breakfast. 02/02/18  Yes Hisada, Reina, MD  pantoprazole (PROTONIX) 20 MG tablet Take 1 tablet (20 mg total) by mouth daily. 05/05/18   Donnetta Hutching, MD    Family History Family History  Problem Relation Age of Onset  . Hypertension Mother   . Depression Mother   . Hyperlipidemia Mother   . Heart  disease Mother   . Heart attack Mother   . Cancer Father        pancreatic  . Mental illness Sister        schizoid  . Diabetes Sister   . Schizophrenia Sister   . Cancer Maternal Grandmother        myleodysplastic syndrome  . Depression Maternal Grandmother   . Myelodysplastic syndrome Maternal Grandmother   . Diabetes Maternal Grandmother   . Hypertension Maternal Grandmother   . Alcohol abuse Maternal Grandfather     Social History Social History   Tobacco Use  . Smoking status: Current Every Day Smoker    Packs/day: 0.50    Years: 23.00    Pack years: 11.50    Types: Cigarettes    Start date: 09/17/1995  . Smokeless tobacco: Never Used  . Tobacco comment: trying to quit  Substance Use Topics  . Alcohol use: No  . Drug use: Yes    Types: Marijuana    Comment: occ     Allergies   Patient has no known allergies.   Review of Systems Review of Systems  All other systems reviewed and are negative.    Physical Exam Updated Vital Signs BP 126/83   Pulse 70   Temp (!) 96.1 F (35.6 C) (Temporal)   Resp 16   LMP 04/28/2018   SpO2 98%   Physical Exam  Constitutional: She is oriented to person, place, and time. She appears well-developed and well-nourished.  HENT:  Head: Normocephalic and atraumatic.  Eyes: Conjunctivae are normal.  Neck: Neck supple.  Cardiovascular: Normal rate and regular rhythm.  Pulmonary/Chest: Effort normal and breath sounds normal.  Abdominal: Soft. Bowel sounds are normal.  Musculoskeletal: Normal range of motion.  Neurological: She is alert and oriented to person, place, and time.  Skin: Skin is warm and dry.  Psychiatric: She has a normal mood and affect. Her behavior is normal.  Nursing note and vitals reviewed.    ED Treatments / Results  Labs (all labs ordered are listed, but only abnormal results are displayed) Labs Reviewed  BASIC METABOLIC PANEL - Abnormal; Notable for the following components:      Result Value    Potassium 3.4 (*)    Glucose, Bld 115 (*)    All other components within normal limits  CBC - Abnormal; Notable for the following components:   WBC 13.2 (*)    All other components within normal limits  TROPONIN I  HCG, QUANTITATIVE, PREGNANCY    EKG EKG Interpretation  Date/Time:  Tuesday May 05 2018 15:15:34 EDT Ventricular  Rate:  74 PR Interval:  152 QRS Duration: 78 QT Interval:  374 QTC Calculation: 415 R Axis:   29 Text Interpretation:  Normal sinus rhythm Low voltage QRS Cannot rule out Anterior infarct , age undetermined Abnormal ECG Confirmed by Kennis CarinaBero, Michael [phone]([id number]) on 05/05/2018 3:19:19 PM Also confirmed by Donnetta Hutchingook, Jewel Mcafee ([id number])  on 05/05/2018 4:15:37 PM Also confirmed by Donnetta Hutchingook, Yareni Creps ([id number])  on 05/05/2018 4:45:00 PM Also confirmed by Donnetta Hutchingook, Vandy Tsuchiya ([id number])  on 05/05/2018 5:21:28 PM   Radiology Dg Chest 2 View  Result Date: 05/05/2018 CLINICAL DATA:  Chest pain EXAM: CHEST - 2 VIEW COMPARISON:  06/20/2014 chest radiograph. FINDINGS: Stable cardiomediastinal silhouette with normal heart size. No pneumothorax. No pleural effusion. Lungs appear clear, with no acute consolidative airspace disease and no pulmonary edema. IMPRESSION: No active cardiopulmonary disease. Electronically Signed   By: Delbert PhenixJason A Poff M.D.   On: 05/05/2018 16:29   [place] Pelvis Transvanginal Non-ob (tv Only)  Result Date: 05/04/2018 GYNECOLOGIC SONOGRAM Tiffany Morgan is a 47 y.o. [id number] LMP 04/28/2018,she is here for a pelvic sonogram for menorrhagia. Uterus                      8.8 x 4.1 x 7.4 cm, vol 140 ml,homogeneous bicornuate anteverted uterus,wnl Endometrium          Right 5.6 mm,left 5.4 mm, symmetrical, wnl Right ovary             2.7 x 1.7 x 2.7 cm, wnl Left ovary                2.5 x 2.9 x 2.2 cm, wnl No free fluid Technician Comments: PELVIC US TA/TV:homogeneous bicornuate anteverted uterus,wnl,EEC right 5.6 mm,EEC left 5.4 mm,normal ovaries bilat,no free fluid,no pain during ultrasound,ovaries appear  mobile,limited movement of left ovary because of ovary position E. I. du Pontmber J Carl 05/04/2018 9:45 AM Clinical Impression and recommendations: I have reviewed the sonogram results above. Cervical stenosis, pt had forgotten old medical info about bicornuate uterus. Combined with the patient's current clinical course, below are my impressions and any appropriate recommendations for management based on the sonographic findings: 1  Bicornuate uterus, not a candidate for iud. 2  Would prefer to attempt tx with medical management of menses, HTA ablation a possibility. 3 normal adnexal structures. Tilda BurrowJohn V Ferguson   [place] Pelvis (transabdominal Only)  Result Date: 05/04/2018 GYNECOLOGIC SONOGRAM Tiffany Morgan is a 47 y.o. [id number] LMP 04/28/2018,she is here for a pelvic sonogram for menorrhagia. Uterus                      8.8 x 4.1 x 7.4 cm, vol 140 ml,homogeneous bicornuate anteverted uterus,wnl Endometrium          Right 5.6 mm,left 5.4 mm, symmetrical, wnl Right ovary             2.7 x 1.7 x 2.7 cm, wnl Left ovary                2.5 x 2.9 x 2.2 cm, wnl No free fluid Technician Comments: PELVIC US TA/TV:homogeneous bicornuate anteverted uterus,wnl,EEC right 5.6 mm,EEC left 5.4 mm,normal ovaries bilat,no free fluid,no pain during ultrasound,ovaries appear mobile,limited movement of left ovary because of ovary position E. [employer] J Carl 05/04/2018 9:45 AM Clinical Impression and recommendations: I have reviewed the sonogram results above. Cervical stenosis, pt had forgotten old medical info about bicornuate uterus. Combined with the patient's current clinical course, below are my impressions and any  appropriate recommendations for management based on the sonographic findings: 1  Bicornuate uterus, not a candidate for iud. 2  Would prefer to attempt tx with medical management of menses, HTA ablation a possibility. 3 normal adnexal structures. Tilda Burrow    Procedures Procedures (including critical care time)  Medications  Ordered in ED Medications  gi cocktail (Maalox,Lidocaine,Donnatal) (30 mLs Oral Given 05/05/18 1731)     Initial Impression / Assessment and Plan / ED Course  I have reviewed the triage vital signs and the nursing notes.  Pertinent labs & imaging results that were available during my care of the patient were reviewed by me and considered in my medical decision making (see chart for details).     Patient presents with brief spells of chest pain intermittently for a month not associated with any activity.  Work-up including EKG, chest x-ray, labs, troponin all negative.  She states ulcer medication has helped in the past.  Discharge medication Protonix 20 mg.  She will follow-up with her primary care doctor with a possible cardiology referral.  Final Clinical Impressions(s) / ED Diagnoses   Final diagnoses:  Chest pain, unspecified type    ED Discharge Orders         Ordered    pantoprazole (PROTONIX) 20 MG tablet  Daily     05/05/18 1745           Donnetta Hutching, MD 05/05/18 1757

## 2018-05-05 NOTE — ED Triage Notes (Signed)
Pt has been having on and off chest pains for the last month. Chest tightness all the way through to her back and is also having a chest pain. Pt has not been seen for this before. Called PCP and referred her here. SOB and lightheaded.
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
## 2018-05-05 NOTE — Discharge Instructions (Addendum)
Test showed no life-threatening condition.  Will start a medication for ulcer pain.  Follow-up with your primary care doctor for further instructions.

## 2018-05-06 ENCOUNTER — Ambulatory Visit (INDEPENDENT_AMBULATORY_CARE_PROVIDER_SITE_OTHER): Payer: Medicaid Other | Admitting: Psychiatry

## 2018-05-06 DIAGNOSIS — F331 Major depressive disorder, recurrent, moderate: Secondary | ICD-10-CM
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
## 2018-05-07 ENCOUNTER — Encounter (HOSPITAL_COMMUNITY): Payer: Self-pay | Admitting: Psychiatry

## 2018-05-07 ENCOUNTER — Ambulatory Visit (INDEPENDENT_AMBULATORY_CARE_PROVIDER_SITE_OTHER): Payer: Medicaid Other | Admitting: Psychiatry

## 2018-05-07 VITALS — BP 137/88 | HR 83 | Ht 67.0 in | Wt 195.0 lb

## 2018-05-07 DIAGNOSIS — F331 Major depressive disorder, recurrent, moderate: Secondary | ICD-10-CM | POA: Diagnosis not present

## 2018-05-07 MED ORDER — VENLAFAXINE HCL ER 150 MG PO CP24: ORAL_CAPSULE | ORAL | 2 refills | Status: DC

## 2018-05-07 MED ORDER — BUSPIRONE HCL 5 MG PO TABS: 5.0000 mg | ORAL_TABLET | Freq: Three times a day (TID) | ORAL | 2 refills | Status: DC

## 2018-05-07 MED ORDER — LORAZEPAM 0.5 MG PO TABS: 0.5000 mg | ORAL_TABLET | Freq: Every day | ORAL | 2 refills | Status: DC | PRN

## 2018-05-07 MED ORDER — TRAZODONE HCL 50 MG PO TABS: ORAL_TABLET | ORAL | 2 refills | Status: DC

## 2018-05-07 MED ORDER — VENLAFAXINE HCL ER 37.5 MG PO CP24: 37.5000 mg | ORAL_CAPSULE | Freq: Every day | ORAL | 2 refills | Status: DC

## 2018-05-07 NOTE — Patient Instructions (Signed)
1.Increase venlafaxine187.5 mg daily (150 mg +37.5 mg) 2  Continue Buspar5 mg three times a day 3.Continue lorazepam0.5 mg daily as needed for anxiety 4.ContinueTrazodone25-50 mg at night as needed for sleep

## 2018-05-20 ENCOUNTER — Ambulatory Visit (HOSPITAL_COMMUNITY): Payer: Self-pay | Admitting: Psychiatry

## 2018-06-03 ENCOUNTER — Ambulatory Visit (HOSPITAL_COMMUNITY): Payer: Self-pay | Admitting: Psychiatry

## 2018-06-17 ENCOUNTER — Ambulatory Visit (INDEPENDENT_AMBULATORY_CARE_PROVIDER_SITE_OTHER): Payer: Medicaid Other | Admitting: Psychiatry

## 2018-06-17 ENCOUNTER — Encounter (HOSPITAL_COMMUNITY): Payer: Self-pay | Admitting: Psychiatry

## 2018-06-17 DIAGNOSIS — F331 Major depressive disorder, recurrent, moderate: Secondary | ICD-10-CM | POA: Diagnosis not present
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
## 2018-07-01 ENCOUNTER — Ambulatory Visit (HOSPITAL_COMMUNITY): Payer: Self-pay | Admitting: Psychiatry

## 2018-07-02 ENCOUNTER — Ambulatory Visit: Payer: Medicaid Other | Admitting: Obstetrics and Gynecology

## 2018-07-08 ENCOUNTER — Ambulatory Visit: Payer: Medicaid Other | Admitting: Obstetrics and Gynecology

## 2018-07-15 ENCOUNTER — Ambulatory Visit (INDEPENDENT_AMBULATORY_CARE_PROVIDER_SITE_OTHER): Payer: Medicaid Other | Admitting: Psychiatry

## 2018-07-15 ENCOUNTER — Encounter (HOSPITAL_COMMUNITY): Payer: Self-pay | Admitting: Psychiatry

## 2018-07-15 DIAGNOSIS — F331 Major depressive disorder, recurrent, moderate: Secondary | ICD-10-CM
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
## 2018-07-23 ENCOUNTER — Ambulatory Visit: Payer: Medicaid Other | Admitting: Obstetrics and Gynecology

## 2018-07-27 ENCOUNTER — Other Ambulatory Visit (HOSPITAL_COMMUNITY): Payer: Self-pay

## 2018-07-29 ENCOUNTER — Ambulatory Visit (HOSPITAL_COMMUNITY): Payer: Self-pay | Admitting: Psychiatry

## 2018-08-03 ENCOUNTER — Ambulatory Visit (HOSPITAL_COMMUNITY): Payer: Self-pay | Admitting: Hematology
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
## 2018-08-10 ENCOUNTER — Ambulatory Visit (INDEPENDENT_AMBULATORY_CARE_PROVIDER_SITE_OTHER): Payer: Medicaid Other | Admitting: Psychiatry

## 2018-08-10 VITALS — BP 146/88 | HR 73 | Ht 67.0 in | Wt 203.0 lb

## 2018-08-10 DIAGNOSIS — F331 Major depressive disorder, recurrent, moderate: Secondary | ICD-10-CM

## 2018-08-10 MED ORDER — LORAZEPAM 0.5 MG PO TABS: 0.5000 mg | ORAL_TABLET | Freq: Every day | ORAL | 2 refills | Status: DC | PRN

## 2018-08-10 MED ORDER — TRAZODONE HCL 50 MG PO TABS: ORAL_TABLET | ORAL | 2 refills | Status: DC

## 2018-08-10 MED ORDER — BUSPIRONE HCL 5 MG PO TABS: 5.0000 mg | ORAL_TABLET | Freq: Three times a day (TID) | ORAL | 2 refills | Status: DC

## 2018-08-10 MED ORDER — VENLAFAXINE HCL ER 150 MG PO CP24: 150.0000 mg | ORAL_CAPSULE | Freq: Every day | ORAL | 2 refills | Status: DC

## 2018-08-10 NOTE — Patient Instructions (Signed)
1.Continuevenlafaxine150 mg daily  2 Continue Buspar5 mg three times a day 3.Continue lorazepam0.5 mg daily as needed for anxiety  4.ContinueTrazodone25-50 mg at night as needed for sleep 5.Return to clinic inthree months for 15 mins 

## 2018-08-12 ENCOUNTER — Ambulatory Visit (HOSPITAL_COMMUNITY): Payer: Self-pay | Admitting: Psychiatry

## 2018-08-26 ENCOUNTER — Ambulatory Visit (HOSPITAL_COMMUNITY): Payer: Self-pay | Admitting: Psychiatry

## 2018-09-24 ENCOUNTER — Ambulatory Visit (HOSPITAL_COMMUNITY): Payer: Medicaid Other | Admitting: Psychiatry

## 2018-09-28 ENCOUNTER — Other Ambulatory Visit: Payer: Self-pay

## 2018-09-28 ENCOUNTER — Inpatient Hospital Stay (HOSPITAL_COMMUNITY): Payer: Medicaid Other | Attending: Hematology

## 2018-09-28 ENCOUNTER — Inpatient Hospital Stay (HOSPITAL_COMMUNITY): Payer: Medicaid Other | Attending: Hematology | Admitting: Hematology

## 2018-09-28 ENCOUNTER — Encounter (HOSPITAL_COMMUNITY): Payer: Self-pay | Admitting: Hematology

## 2018-09-28 VITALS — BP 129/72 | HR 71 | Temp 98.2°F | Resp 16 | Wt 211.6 lb

## 2018-09-28 DIAGNOSIS — D72829 Elevated white blood cell count, unspecified: Secondary | ICD-10-CM | POA: Diagnosis not present

## 2018-09-28 DIAGNOSIS — I10 Essential (primary) hypertension: Secondary | ICD-10-CM | POA: Insufficient documentation

## 2018-09-28 DIAGNOSIS — D72820 Lymphocytosis (symptomatic): Secondary | ICD-10-CM | POA: Insufficient documentation

## 2018-09-28 DIAGNOSIS — Z79899 Other long term (current) drug therapy: Secondary | ICD-10-CM | POA: Diagnosis not present

## 2018-09-28 DIAGNOSIS — F17210 Nicotine dependence, cigarettes, uncomplicated: Secondary | ICD-10-CM

## 2018-09-28 LAB — CBC WITH DIFFERENTIAL/PLATELET
Abs Immature Granulocytes: 0.12 10*3/uL — ABNORMAL HIGH (ref 0.00–0.07)
Basophils Absolute: 0.1 10*3/uL (ref 0.0–0.1)
Basophils Relative: 1 %
EOS ABS: 0.6 10*3/uL — AB (ref 0.0–0.5)
EOS PCT: 5 %
HEMATOCRIT: 40.8 % (ref 36.0–46.0)
Hemoglobin: 13.3 g/dL (ref 12.0–15.0)
Immature Granulocytes: 1 %
LYMPHS ABS: 4.8 10*3/uL — AB (ref 0.7–4.0)
Lymphocytes Relative: 38 %
MCH: 30.2 pg (ref 26.0–34.0)
MCHC: 32.6 g/dL (ref 30.0–36.0)
MCV: 92.5 fL (ref 80.0–100.0)
MONOS PCT: 5 %
Monocytes Absolute: 0.7 10*3/uL (ref 0.1–1.0)
Neutro Abs: 6.4 10*3/uL (ref 1.7–7.7)
Neutrophils Relative %: 50 %
Platelets: 378 10*3/uL (ref 150–400)
RBC: 4.41 MIL/uL (ref 3.87–5.11)
RDW: 14.6 % (ref 11.5–15.5)
WBC: 12.6 10*3/uL — ABNORMAL HIGH (ref 4.0–10.5)
nRBC: 0 % (ref 0.0–0.2)

## 2018-09-28 NOTE — Progress Notes (Signed)
[name], Tiffany Morgan [id number]   CLINIC:  Medical Oncology/Hematology  PCP:  Celene Squibb, MD [place] [address] [phone]   REASON FOR VISIT: Follow-up for Leukocytosis.  CURRENT THERAPY: Observation    INTERVAL HISTORY:  Ms. Tiffany Morgan 48 y.o. female returns for routine follow-up for leukocytosis. She is here today and doing well. She does report having a UTI in December and she completed her antibiotics. Denies any nausea, vomiting, or diarrhea. Denies any new pains. Had not noticed any recent bleeding such as epistaxis, hematuria or hematochezia. Denies recent chest pain on exertion, shortness of breath on minimal exertion, pre-syncopal episodes, or palpitations. Denies any numbness or tingling in hands or feet. Denies any recent fevers, infections, or recent hospitalizations. She occasionally wakes up in the middle of the night and she is soaked. Denies having this issue during the day. She does reports occasional hot flashes. She reports her appetite is at 50% and energy level at 25%. She is still smoking about a half a pack per day.   REVIEW OF SYSTEMS:  Review of Systems  Cardiovascular: Positive for chest pain.  Neurological: Positive for numbness.  All other systems reviewed and are negative.    PAST MEDICAL/SURGICAL HISTORY:  Past Medical History:  Diagnosis Date  . Anxiety   . Arthritis    spine  . Chronic back pain    started about 6 years ago - four bulging discs  . Depression   . Essential hypertension 08/22/2017   Past Surgical History:  Procedure Laterality Date  . BREAST BIOPSY Left 01/31/2016   benign  . BREAST BIOPSY Left 08/17/2013   benign  . CESAREAN SECTION     3  . TUBAL LIGATION       SOCIAL HISTORY:  Social History   Socioeconomic History  . Marital status: Single    Spouse name: Not on file  . Number of children: 2  . Years of education: 64  . Highest education level: Not  on file  Occupational History  . 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 Financial resource strain: Not on file  . Food insecurity:    Worry: Not on file    Inability: Not on file  . Transportation needs:    Medical: Not on file    Non-medical: Not on file  Tobacco Use  . Smoking status: Current Every Day Smoker    Packs/day: 0.50    Years: 23.00    Pack years: 11.50    Types: Cigarettes    Start date: 09/17/1995  . Smokeless tobacco: Never Used  . Tobacco comment: trying to quit  Substance and Sexual Activity  . Alcohol use: No  . Drug use: Yes    Types: Marijuana    Comment: 2 joints per week  . Sexual activity: Not Currently    Birth control/protection: Surgical    Comment: tubal  Lifestyle  . Physical activity:    Days per week: Not on file    Minutes per session: Not on file  . Stress: Not on file  Relationships  . Social connections:    Talks on phone: Not on file    Gets together: Not on file    Attends religious service: Not on file    Active member of club or organization: Not on file    Attends meetings of clubs or organizations: Not on file  Relationship status: Not on file  . Intimate partner violence:    Fear of current or ex partner: Not on file    Emotionally abused: Not on file    Physically abused: Not on file    Forced sexual activity: Not on file  Other Topics Concern  . Not on file  Social History Narrative   Single   Lives with [name] Modena Nunnery   Feels unable to work due to back pain    FAMILY HISTORY:  Family History  Problem Relation Age of Onset  . Hypertension Mother   . Depression Mother   . Hyperlipidemia Mother   . Heart disease Mother   . Heart attack Mother   . Cancer Father        pancreatic  . Mental illness Sister        schizoid  . Diabetes Sister   . Schizophrenia Sister   . Cancer Maternal Grandmother        myleodysplastic syndrome  . Depression Maternal Grandmother   . Myelodysplastic syndrome  Maternal Grandmother   . Diabetes Maternal Grandmother   . Hypertension Maternal Grandmother   . Alcohol abuse Maternal Grandfather     CURRENT MEDICATIONS:  Outpatient Encounter Medications as of 09/28/2018  Medication Sig  . busPIRone (BUSPAR) 5 MG tablet Take 1 tablet (5 mg total) by mouth 3 (three) times daily.  Marland Kitchen gabapentin (NEURONTIN) 300 MG capsule TAKE 1 CAPSULE BY MOUTH THREE TIMES DAILY.  Marland Kitchen LORazepam (ATIVAN) 0.5 MG tablet Take 1 tablet (0.5 mg total) by mouth daily as needed for anxiety.  . metoprolol tartrate (LOPRESSOR) 25 MG tablet Take 25 mg by mouth daily.  . simvastatin (ZOCOR) 40 MG tablet Take 40 mg by mouth daily.  . traZODone (DESYREL) 50 MG tablet 25-50 mg at night as needed for sleep  . venlafaxine XR (EFFEXOR-XR) 150 MG 24 hr capsule Take 1 capsule (150 mg total) by mouth daily.  . Vitamin D, Ergocalciferol, (DRISDOL) 1.25 MG (50000 UT) CAPS capsule Take 50,000 Units by mouth every 7 (seven) days.  . methocarbamol (ROBAXIN) 750 MG tablet Take 750 mg by mouth 2 (two) times daily as needed for muscle spasms.  Marland Kitchen oxybutynin (DITROPAN) 5 MG tablet Take 1 tablet (5 mg total) by mouth 2 (two) times daily as needed for bladder spasms. (Patient not taking: Reported on 09/28/2018)  . [DISCONTINUED] ciclopirox (PENLAC) 8 % solution Apply topically at bedtime. Apply over nail and surrounding skin. Apply daily over previous coat. After seven (7) days, may remove with alcohol and continue cycle.  . [DISCONTINUED] norethindrone (MICRONOR,CAMILA,ERRIN) 0.35 MG tablet Take 1 tablet by mouth daily.  . [DISCONTINUED] pantoprazole (PROTONIX) 20 MG tablet Take 1 tablet (20 mg total) by mouth daily.   No facility-administered encounter medications on file as of 09/28/2018.     ALLERGIES:  No Known Allergies   PHYSICAL EXAM:  ECOG Performance status: 1  Vitals:   09/28/18 0900  BP: 129/72  Pulse: 71  Resp: 16  Temp: 98.2 F (36.8 C)  SpO2: 100%   Filed Weights   09/28/18 0900   Weight: 211 lb 9 oz (96 kg)    Physical Exam Constitutional:      Appearance: Normal appearance. She is normal weight.  Abdominal:     General: Abdomen is flat.     Palpations: Abdomen is soft.  Musculoskeletal: Normal range of motion.  Skin:    General: Skin is warm and dry.  Neurological:     Mental Status:  She is alert and oriented to person, place, and time. Mental status is at baseline.  Psychiatric:        Mood and Affect: Mood normal.        Behavior: Behavior normal.        Thought Content: Thought content normal.        Judgment: Judgment normal.      LABORATORY DATA:  I have reviewed the labs as listed.  CBC    Component Value Date/Time   WBC 12.6 (H) 09/28/2018 0937   RBC 4.41 09/28/2018 0937   HGB 13.3 09/28/2018 0937   HCT 40.8 09/28/2018 0937   PLT 378 09/28/2018 0937   MCV 92.5 09/28/2018 0937   MCH 30.2 09/28/2018 0937   MCHC 32.6 09/28/2018 0937   RDW 14.6 09/28/2018 0937   LYMPHSABS 4.8 (H) 09/28/2018 0937   MONOABS 0.7 09/28/2018 0937   EOSABS 0.6 (H) 09/28/2018 0937   BASOSABS 0.1 09/28/2018 0937   CMP Latest Ref Rng & Units 05/05/2018 07/21/2017 02/19/2017  Glucose 70 - 99 mg/dL 115(H) 91 116(H)  BUN 6 - 20 mg/dL 7 5(L) 6(L)  Creatinine 0.44 - 1.00 mg/dL 0.78 0.79 0.78  Sodium 135 - 145 mmol/L 137 137 140  Potassium 3.5 - 5.1 mmol/L 3.4(L) 4.4 4.0  Chloride 98 - 111 mmol/L 104 103 110  CO2 22 - 32 mmol/L '26 29 23  ' Calcium 8.9 - 10.3 mg/dL 9.1 9.7 9.1  Total Protein 6.1 - 8.1 g/dL - 7.1 6.4  Total Bilirubin 0.2 - 1.2 mg/dL - 0.3 0.3  Alkaline Phos 33 - 115 U/L - - 97  AST 10 - 35 U/L - 13 10  ALT 6 - 29 U/L - 9 8       DIAGNOSTIC IMAGING:  I have independently reviewed the scans and discussed with the patient.   I have reviewed Francene Finders, NP's note and agree with the documentation.  I personally performed a face-to-face visit, made revisions and my assessment and plan is as follows.    ASSESSMENT & PLAN:   Lymphocytosis 1.   Leukocytosis:  -This is a combination of neutrophilic and lymphocytic leukocytosis.  Occasional eosinophilia present.  -Flow cytometry was negative for any lymphoproliferative disorder. -BCR/ABL by FISH and Jak 2 V6 33F mutation testing was negative for myeloproliferative disorders. - It was thought that her smoking was contributing to the leukocytosis.  She is still smoking about half pack per day. -She did not have any fevers, night sweats or weight loss in the last 6 months.  She had a UTI in December.  She did not have any other active infections. - We reviewed her blood work today.  White count is 12.6.  This was predominantly lymphocytes and eosinophils. - As her elevated white count is more or less stable, we will continue to monitor her at 80-monthinterval. -If there is a significant change in her blood counts, will consider doing a bone marrow biopsy.      Orders placed this encounter:  Orders Placed This Encounter  Procedures  . Lactate dehydrogenase  . CBC with Differential/Platelet  . Comprehensive metabolic panel      SDerek Jack MD [phone]

## 2018-09-28 NOTE — Patient Instructions (Addendum)
[facility] at [facility] Discharge Instructions Follow up in 4 months with labs    Thank you for choosing [facility] at [facility] to provide your oncology and hematology care.  To afford each patient quality time with our provider, please arrive at least 15 minutes before your scheduled appointment time.   If you have a lab appointment with the Cancer Center please come in thru the  Main Entrance and check in at the main information desk  You need to re-schedule your appointment should you arrive 10 or more minutes late.  We strive to give you quality time with our providers, and arriving late affects you and other patients whose appointments are after yours.  Also, if you no show three or more times for appointments you may be dismissed from the clinic at the providers discretion.     Again, thank you for choosing [facility].  Our hope is that these requests will decrease the amount of time that you wait before being seen by our physicians.       _____________________________________________________________  Should you have questions after your visit to  Cancer Center, please contact our office at [phone] between the hours of 8:00 a.m. and 4:30 p.m.  Voicemails left after 4:00 p.m. will not be returned until the following business day.  For prescription refill requests, have your pharmacy contact our office and allow 72 hours.    Cancer Center Support Programs:   > Cancer Support Group  2nd Tuesday of the month 1pm-2pm, Journey Room    

## 2018-09-28 NOTE — Assessment & Plan Note (Signed)
1.  Leukocytosis:  -This is a combination of neutrophilic and lymphocytic leukocytosis.  Occasional eosinophilia present.  -Flow cytometry was negative for any lymphoproliferative disorder. -BCR/ABL by FISH and Jak 2 V6 71F mutation testing was negative for myeloproliferative disorders. - It was thought that her smoking was contributing to the leukocytosis.  She is still smoking about half pack per day. -She did not have any fevers, night sweats or weight loss in the last 6 months.  She had a UTI in December.  She did not have any other active infections. - We reviewed her blood work today.  White count is 12.6.  This was predominantly lymphocytes and eosinophils. - As her elevated white count is more or less stable, we will continue to monitor her at 86-monthinterval. -If there is a significant change in her blood counts, will consider doing a bone marrow biopsy.

## 2018-10-07 ENCOUNTER — Ambulatory Visit (HOSPITAL_COMMUNITY): Payer: Medicaid Other | Admitting: Psychiatry

## 2018-10-28 ENCOUNTER — Ambulatory Visit (INDEPENDENT_AMBULATORY_CARE_PROVIDER_SITE_OTHER): Payer: Medicaid Other | Admitting: Psychiatry

## 2018-10-28 ENCOUNTER — Encounter (HOSPITAL_COMMUNITY): Payer: Self-pay | Admitting: Psychiatry

## 2018-10-28 DIAGNOSIS — F331 Major depressive disorder, recurrent, moderate: Secondary | ICD-10-CM | POA: Diagnosis not present
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
## 2018-11-10 ENCOUNTER — Ambulatory Visit (HOSPITAL_COMMUNITY): Payer: Medicaid Other | Admitting: Psychiatry

## 2018-11-11 ENCOUNTER — Ambulatory Visit (HOSPITAL_COMMUNITY): Payer: Medicaid Other | Admitting: Psychiatry
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
## 2018-11-23 ENCOUNTER — Ambulatory Visit (INDEPENDENT_AMBULATORY_CARE_PROVIDER_SITE_OTHER): Payer: Medicaid Other | Admitting: Psychiatry

## 2018-11-23 ENCOUNTER — Encounter (HOSPITAL_COMMUNITY): Payer: Self-pay | Admitting: Psychiatry

## 2018-11-23 VITALS — BP 138/96 | HR 85 | Ht 67.0 in | Wt 207.0 lb

## 2018-11-23 DIAGNOSIS — F3341 Major depressive disorder, recurrent, in partial remission: Secondary | ICD-10-CM | POA: Diagnosis not present

## 2018-11-23 MED ORDER — TRAZODONE HCL 50 MG PO TABS: ORAL_TABLET | ORAL | 2 refills | Status: DC

## 2018-11-23 MED ORDER — VENLAFAXINE HCL ER 150 MG PO CP24: 150.0000 mg | ORAL_CAPSULE | Freq: Every day | ORAL | 2 refills | Status: DC

## 2018-11-23 MED ORDER — BUSPIRONE HCL 5 MG PO TABS: 5.0000 mg | ORAL_TABLET | Freq: Three times a day (TID) | ORAL | 2 refills | Status: DC

## 2018-11-23 NOTE — Patient Instructions (Addendum)
1.Continuevenlafaxine150 mg daily  2 Continue Buspar5 mg three times a day 3.Continue lorazepam0.5 mg daily as needed for anxiety  4.ContinueTrazodone25-50 mg at night as needed for sleep 5.Return to clinic inthree months for 15 mins

## 2018-11-27 ENCOUNTER — Ambulatory Visit (HOSPITAL_COMMUNITY): Payer: Medicaid Other | Admitting: Psychiatry

## 2018-12-02 ENCOUNTER — Other Ambulatory Visit (HOSPITAL_COMMUNITY): Payer: Self-pay | Admitting: Psychiatry

## 2018-12-02 ENCOUNTER — Telehealth (HOSPITAL_COMMUNITY): Payer: Self-pay | Admitting: *Deleted

## 2018-12-02 MED ORDER — LORAZEPAM 0.5 MG PO TABS: 0.5000 mg | ORAL_TABLET | Freq: Every day | ORAL | 2 refills | Status: DC | PRN

## 2018-12-02 NOTE — Telephone Encounter (Signed)
I think she declined to receive refill at the last visit. Ordered to last until the next appointment.

## 2018-12-02 NOTE — Telephone Encounter (Signed)
Dr Vanetta Shawl Patient called hasn't received refills on the Ativan. Request refills

## 2019-01-21 ENCOUNTER — Other Ambulatory Visit (HOSPITAL_COMMUNITY): Payer: Self-pay

## 2019-01-25 ENCOUNTER — Ambulatory Visit (HOSPITAL_COMMUNITY): Payer: Self-pay | Admitting: Hematology
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
## 2019-02-23 ENCOUNTER — Ambulatory Visit (INDEPENDENT_AMBULATORY_CARE_PROVIDER_SITE_OTHER): Payer: Medicaid Other | Admitting: Psychiatry

## 2019-02-23 ENCOUNTER — Encounter (HOSPITAL_COMMUNITY): Payer: Self-pay | Admitting: Psychiatry

## 2019-02-23 ENCOUNTER — Other Ambulatory Visit: Payer: Self-pay

## 2019-02-23 DIAGNOSIS — F3341 Major depressive disorder, recurrent, in partial remission: Secondary | ICD-10-CM

## 2019-02-23 DIAGNOSIS — F419 Anxiety disorder, unspecified: Secondary | ICD-10-CM

## 2019-02-23 MED ORDER — VENLAFAXINE HCL ER 37.5 MG PO CP24: ORAL_CAPSULE | ORAL | 1 refills | Status: DC

## 2019-02-23 MED ORDER — VENLAFAXINE HCL ER 150 MG PO CP24: 150.0000 mg | ORAL_CAPSULE | Freq: Every day | ORAL | 1 refills | Status: DC

## 2019-02-23 MED ORDER — TRAZODONE HCL 100 MG PO TABS: ORAL_TABLET | ORAL | 1 refills | Status: DC

## 2019-02-23 MED ORDER — LORAZEPAM 0.5 MG PO TABS: 0.5000 mg | ORAL_TABLET | Freq: Every day | ORAL | 1 refills | Status: DC | PRN

## 2019-02-23 NOTE — Patient Instructions (Signed)
1.Increase venlafaxine187.5 mg (150 mg + 37.5 mg) daily 2 Continue Buspar5 mg three times a day 3.Continue lorazepam0.5 mg daily as needed for anxiety  4.Increase Trazodone50-100 mg at night as needed for sleep 5.Next appointment: 8/3 at 10:40

## 2019-03-16 ENCOUNTER — Other Ambulatory Visit (HOSPITAL_COMMUNITY): Payer: Self-pay | Admitting: Internal Medicine

## 2019-03-16 DIAGNOSIS — M545 Low back pain, unspecified: Secondary | ICD-10-CM
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
## 2019-04-14 ENCOUNTER — Other Ambulatory Visit (HOSPITAL_COMMUNITY): Payer: Self-pay | Admitting: Internal Medicine

## 2019-04-14 ENCOUNTER — Other Ambulatory Visit: Payer: Self-pay

## 2019-04-14 ENCOUNTER — Ambulatory Visit (HOSPITAL_COMMUNITY)
Admission: RE | Admit: 2019-04-14 | Discharge: 2019-04-14 | Disposition: A | Discharge status: Home / Self Care | Payer: Medicaid Other | Source: Ambulatory Visit | Attending: Internal Medicine | Admitting: Internal Medicine

## 2019-04-14 ENCOUNTER — Other Ambulatory Visit (HOSPITAL_COMMUNITY): Payer: Self-pay | Admitting: Obstetrics and Gynecology

## 2019-04-14 DIAGNOSIS — Z1231 Encounter for screening mammogram for malignant neoplasm of breast: Secondary | ICD-10-CM

## 2019-04-14 DIAGNOSIS — M545 Low back pain, unspecified: Secondary | ICD-10-CM

## 2019-04-19 ENCOUNTER — Other Ambulatory Visit: Payer: Self-pay

## 2019-04-19 ENCOUNTER — Encounter (HOSPITAL_COMMUNITY): Payer: Self-pay | Admitting: Psychiatry

## 2019-04-19 ENCOUNTER — Ambulatory Visit (INDEPENDENT_AMBULATORY_CARE_PROVIDER_SITE_OTHER): Payer: Medicaid Other | Admitting: Psychiatry

## 2019-04-19 DIAGNOSIS — F1290 Cannabis use, unspecified, uncomplicated: Secondary | ICD-10-CM

## 2019-04-19 DIAGNOSIS — F3341 Major depressive disorder, recurrent, in partial remission: Secondary | ICD-10-CM | POA: Diagnosis not present

## 2019-04-19 DIAGNOSIS — F419 Anxiety disorder, unspecified: Secondary | ICD-10-CM | POA: Diagnosis not present

## 2019-04-19 MED ORDER — VENLAFAXINE HCL ER 150 MG PO CP24: 150.0000 mg | ORAL_CAPSULE | Freq: Every day | ORAL | 2 refills | Status: DC

## 2019-04-19 MED ORDER — BUSPIRONE HCL 5 MG PO TABS: 5.0000 mg | ORAL_TABLET | Freq: Three times a day (TID) | ORAL | 2 refills | Status: DC

## 2019-04-19 MED ORDER — VENLAFAXINE HCL ER 37.5 MG PO CP24: ORAL_CAPSULE | ORAL | 2 refills | Status: DC

## 2019-04-19 MED ORDER — LORAZEPAM 0.5 MG PO TABS: 0.5000 mg | ORAL_TABLET | Freq: Every day | ORAL | 2 refills | Status: DC | PRN

## 2019-04-19 MED ORDER — TRAZODONE HCL 100 MG PO TABS: 100.0000 mg | ORAL_TABLET | Freq: Every evening | ORAL | 2 refills | Status: DC | PRN

## 2019-04-19 NOTE — Patient Instructions (Signed)
1.Continue venlafaxine187.5 mg (150 mg + 37.5 mg) daily 2 Continue Buspar5 mg three times a day 3.Continue lorazepam0.5 mg daily as needed for anxiety 4.Continue Trazodone50-100 mg at night as needed for sleep 5.Next appointment: 10/26 at 10 AM

## 2019-04-28 ENCOUNTER — Other Ambulatory Visit: Payer: Self-pay

## 2019-04-28 ENCOUNTER — Ambulatory Visit (HOSPITAL_COMMUNITY)
Admission: RE | Admit: 2019-04-28 | Discharge: 2019-04-28 | Disposition: A | Discharge status: Home / Self Care | Payer: Medicaid Other | Source: Ambulatory Visit | Attending: Obstetrics and Gynecology | Admitting: Obstetrics and Gynecology

## 2019-04-28 DIAGNOSIS — Z1231 Encounter for screening mammogram for malignant neoplasm of breast: Secondary | ICD-10-CM | POA: Insufficient documentation

## 2019-06-02 ENCOUNTER — Other Ambulatory Visit: Payer: Self-pay

## 2019-06-02 ENCOUNTER — Encounter (HOSPITAL_COMMUNITY): Payer: Self-pay | Admitting: Emergency Medicine

## 2019-06-02 ENCOUNTER — Emergency Department (HOSPITAL_COMMUNITY): Payer: Medicaid Other

## 2019-06-02 ENCOUNTER — Emergency Department (HOSPITAL_COMMUNITY)
Admission: EM | Admit: 2019-06-02 | Discharge: 2019-06-02 | Disposition: A | Discharge status: Home / Self Care | Payer: Medicaid Other | Attending: Emergency Medicine | Admitting: Emergency Medicine

## 2019-06-02 DIAGNOSIS — Z79899 Other long term (current) drug therapy: Secondary | ICD-10-CM | POA: Diagnosis not present

## 2019-06-02 DIAGNOSIS — R1031 Right lower quadrant pain: Secondary | ICD-10-CM | POA: Diagnosis present

## 2019-06-02 DIAGNOSIS — M5441 Lumbago with sciatica, right side: Secondary | ICD-10-CM | POA: Diagnosis not present

## 2019-06-02 DIAGNOSIS — G8929 Other chronic pain: Secondary | ICD-10-CM | POA: Insufficient documentation

## 2019-06-02 DIAGNOSIS — F17210 Nicotine dependence, cigarettes, uncomplicated: Secondary | ICD-10-CM | POA: Insufficient documentation

## 2019-06-02 DIAGNOSIS — I10 Essential (primary) hypertension: Secondary | ICD-10-CM | POA: Insufficient documentation

## 2019-06-02 LAB — URINALYSIS, ROUTINE W REFLEX MICROSCOPIC
Bilirubin Urine: NEGATIVE
Glucose, UA: NEGATIVE mg/dL
Hgb urine dipstick: NEGATIVE
Ketones, ur: NEGATIVE mg/dL
Leukocytes,Ua: NEGATIVE
Nitrite: NEGATIVE
Protein, ur: NEGATIVE mg/dL
Specific Gravity, Urine: 1.021 (ref 1.005–1.030)
pH: 6 (ref 5.0–8.0)

## 2019-06-02 MED ORDER — METHOCARBAMOL 500 MG PO TABS
500.0000 mg | ORAL_TABLET | Freq: Once | ORAL | Status: AC
  Administered 2019-06-02: 500 mg via ORAL
  Filled 2019-06-02: qty 1

## 2019-06-02 MED ORDER — KETOROLAC TROMETHAMINE 60 MG/2ML IM SOLN
60.0000 mg | Freq: Once | INTRAMUSCULAR | Status: AC
  Administered 2019-06-02: 60 mg via INTRAMUSCULAR
  Filled 2019-06-02: qty 2

## 2019-06-02 MED ORDER — METHOCARBAMOL 500 MG PO TABS: 500.0000 mg | ORAL_TABLET | Freq: Three times a day (TID) | ORAL | 0 refills | Status: DC | PRN

## 2019-06-02 MED ORDER — HYDROCODONE-ACETAMINOPHEN 5-325 MG PO TABS
1.0000 | ORAL_TABLET | Freq: Once | ORAL | Status: AC
  Administered 2019-06-02: 1 via ORAL
  Filled 2019-06-02: qty 1

## 2019-06-02 MED ORDER — METHYLPREDNISOLONE 4 MG PO TBPK: ORAL_TABLET | ORAL | 0 refills | Status: DC

## 2019-06-02 NOTE — ED Triage Notes (Signed)
Pt C/O chronic back pain. Pt states that she is supposed to have injections in her back starting October 13th. Pt states that tonight her back and right side are hurting with no relief.

## 2019-06-02 NOTE — Discharge Instructions (Signed)
Take the steroids and muscle relaxers as prescribed.  Do not drive while taking the muscle relaxers.  Follow-up with your doctor.  Return to the ED with worsening pain, fevers, vomiting, weakness, bowel or bladder incontinence or other concerns.

## 2019-06-02 NOTE — ED Provider Notes (Signed)
[facility] EMERGENCY DEPARTMENT Provider Note   CSN: [id number] Arrival date & time: 06/02/19  0053     History   Chief Complaint Chief Complaint  Patient presents with  . Back Pain    HPI Tiffany Morgan is a 48 y.o. female.     Patient with history of chronic back pain without surgery.  States she fell about 6 or 7 years ago and has had back pain every day since.  She only manages this with ibuprofen.  Since yesterday morning she is had increased pain across her low back rating down her right leg which is something that is new.  She has pain going down her right leg with some numbness and tingling that goes to her knee.  She denies any fall or new trauma.  No focal weakness, numbness or tingling otherwise.  No difficulty with urination.  No bowel or bladder incontinence.  No fever, chills, vomiting, pain with urination or blood in the urine.  No chest pain or shortness of breath. States she had an MRI several months ago and was told her back pain was due to arthritis.  She follows with the spine center.  She states this pain is her usual chronic pain but worse and not getting better with treatments at home.  The history is provided by the patient.  Back Pain Associated symptoms: numbness   Associated symptoms: no abdominal pain, no chest pain, no dysuria, no fever, no headaches and no weakness     Past Medical History:  Diagnosis Date  . Anxiety   . Arthritis    spine  . Chronic back pain    started about 6 years ago - four bulging discs  . Depression   . Essential hypertension 08/22/2017    Patient Active Problem List   Diagnosis Date Noted  . 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 Menorrhagia with regular cycle 03/25/2018  . Lymphocytosis 12/09/2017  . Essential hypertension 08/22/2017  . MDD (major depressive disorder), recurrent episode, moderate ([place]) 07/01/2017  . Major depressive disorder 04/02/2017  . GAD (generalized anxiety disorder) 04/02/2017  . Panic  disorder 04/02/2017  . Vitamin D deficiency 02/21/2017  . Tobacco abuse 01/16/2017  . Atherosclerosis of aorta ([place]) 01/16/2017  . Degenerative arthritis of spine 01/16/2017  . Chronic midline back pain 01/16/2017  . Generalized social phobia 01/16/2017  . Overweight 01/16/2017    Past Surgical History:  Procedure Laterality Date  . BREAST BIOPSY Left 01/31/2016   benign  . BREAST BIOPSY Left 08/17/2013   benign  . CESAREAN SECTION     3  . TUBAL LIGATION       OB History    Gravida  5   Para  2   Term  2   Preterm      AB  3   Living  2     SAB      TAB  3   Ectopic      Multiple      Live Births  2        Obstetric Comments  Cesarean section x 2, due to bicornuate uterus and fetal malpresntaton BREECH, THEN REPEAT         Home Medications    Prior to Admission medications   Medication Sig Start Date End Date Taking? Authorizing Provider  busPIRone (BUSPAR) 5 MG tablet Take 1 tablet (5 mg total) by mouth 3 (three) times daily. 04/19/19   Norman Clay, MD  gabapentin (NEURONTIN) 300 MG  capsule TAKE 1 CAPSULE BY MOUTH THREE TIMES DAILY. 12/10/17   Eustace MooreNelson, Yvonne Sue, MD  ibuprofen (ADVIL,MOTRIN) 800 MG tablet Take 800 mg by mouth every 8 (eight) hours as needed.    [provider]  LORazepam (ATIVAN) 0.5 MG tablet Take 1 tablet (0.5 mg total) by mouth daily as needed for anxiety. 04/25/19   Neysa HotterHisada, Reina, MD  methocarbamol (ROBAXIN) 750 MG tablet Take 750 mg by mouth 2 (two) times daily as needed for muscle spasms.    [provider]  metoprolol tartrate (LOPRESSOR) 25 MG tablet Take 25 mg by mouth daily.    [provider]  oxybutynin (DITROPAN) 5 MG tablet Take 1 tablet (5 mg total) by mouth 2 (two) times daily as needed for bladder spasms. 02/27/18   Tilda BurrowFerguson, John V, MD  simvastatin (ZOCOR) 40 MG tablet Take 40 mg by mouth daily.    [provider]  traZODone (DESYREL) 100 MG tablet Take 1 tablet (100 mg total) by  mouth at bedtime as needed for sleep. 04/19/19   Neysa HotterHisada, Reina, MD  venlafaxine XR (EFFEXOR-XR) 150 MG 24 hr capsule Take 1 capsule (150 mg total) by mouth daily. 04/23/19   Neysa HotterHisada, Reina, MD  venlafaxine XR (EFFEXOR-XR) 37.5 MG 24 hr capsule 187.5 mg daily (150 mg +37.5 mg) 04/19/19   Neysa HotterHisada, Reina, MD  Vitamin D, Ergocalciferol, (DRISDOL) 1.25 MG (50000 UT) CAPS capsule Take 50,000 Units by mouth every 7 (seven) days.    [provider]    Family History Family History  Problem Relation Age of Onset  . Hypertension Mother   . Depression Mother   . Hyperlipidemia Mother   . Heart disease Mother   . Heart attack Mother   . Cancer Father        pancreatic  . Mental illness Sister        schizoid  . Diabetes Sister   . Schizophrenia Sister   . Cancer Maternal Grandmother        myleodysplastic syndrome  . Depression Maternal Grandmother   . Myelodysplastic syndrome Maternal Grandmother   . Diabetes Maternal Grandmother   . Hypertension Maternal Grandmother   . Alcohol abuse Maternal Grandfather     Social History Social History   Tobacco Use  . Smoking status: Current Every Day Smoker    Packs/day: 0.50    Years: 23.00    Pack years: 11.50    Types: Cigarettes    Start date: 09/17/1995  . Smokeless tobacco: Never Used  . Tobacco comment: trying to quit  Substance Use Topics  . Alcohol use: No  . Drug use: Yes    Types: Marijuana    Comment: 2 joints per week     Allergies   Patient has no known allergies.   Review of Systems Review of Systems  Constitutional: Negative for activity change, appetite change and fever.  HENT: Negative for congestion and rhinorrhea.   Respiratory: Negative for cough, chest tightness and shortness of breath.   Cardiovascular: Negative for chest pain and leg swelling.  Gastrointestinal: Negative for abdominal pain, nausea and vomiting.  Genitourinary: Negative for dysuria and hematuria.  Musculoskeletal: Positive for back pain.   Skin: Negative for rash.  Neurological: Positive for numbness. Negative for weakness and headaches.   all other systems are negative except as noted in the HPI and PMH.     Physical Exam Updated Vital Signs BP (!) 159/96 (BP Location: Right Arm)   Pulse 81   Temp 98 F (  36.7 C) (Oral)   Resp 15   Ht 5\' 7"  (1.702 m)   Wt 99.8 kg   LMP 05/22/2019   SpO2 99%   BMI 34.46 kg/m   Physical Exam Vitals signs and nursing note reviewed.  Constitutional:      General: She is not in acute distress.    Appearance: She is well-developed.  HENT:     Head: Normocephalic and atraumatic.     Mouth/Throat:     Pharynx: No oropharyngeal exudate.  Eyes:     Conjunctiva/sclera: Conjunctivae normal.     Pupils: Pupils are equal, round, and reactive to light.  Neck:     Musculoskeletal: Normal range of motion and neck supple.     Comments: No meningismus. Cardiovascular:     Rate and Rhythm: Normal rate and regular rhythm.     Heart sounds: Normal heart sounds. No murmur.  Pulmonary:     Effort: Pulmonary effort is normal. No respiratory distress.     Breath sounds: Normal breath sounds.  Abdominal:     Palpations: Abdomen is soft.     Tenderness: There is no abdominal tenderness. There is no guarding or rebound.  Musculoskeletal: Normal range of motion.        General: No tenderness.     Right lower leg: Edema present.     Comments: Paraspinal lumbar tenderness, no midline tenderness  5/5 strength in bilateral lower extremities. Ankle plantar and dorsiflexion intact. Great toe extension intact bilaterally. +2 DP and PT pulses. +2 patellar reflexes bilaterally. Normal gait.   Skin:    General: Skin is warm.     Capillary Refill: Capillary refill takes less than 2 seconds.  Neurological:     General: No focal deficit present.     Mental Status: She is alert and oriented to person, place, and time. Mental status is at baseline.     Cranial Nerves: No cranial nerve deficit.     Motor:  No abnormal muscle tone.     Coordination: Coordination normal.     Comments:  5/5 strength throughout. CN 2-12 intact.Equal grip strength.   Psychiatric:        Behavior: Behavior normal.      ED Treatments / Results  Labs (all labs ordered are listed, but only abnormal results are displayed) Labs Reviewed  URINALYSIS, ROUTINE W REFLEX MICROSCOPIC - Abnormal; Notable for the following components:      Result Value   APPearance HAZY (*)    All other components within normal limits    EKG None  Radiology Dg Lumbar Spine Complete  Result Date: 06/02/2019 CLINICAL DATA:  Worsening chronic lower back pain, no injury EXAM: LUMBAR SPINE - COMPLETE 4+ VIEW COMPARISON:  Lumbar MR 04/14/2019, radiograph 05/21/2016 FINDINGS: Five lumbar type vertebral bodies. Preservation of the normal lumbar lordosis. Multilevel discogenic and facet degenerative changes are present maximal L4-S1. No spondylolysis or spondylolisthesis. No acute fracture or vertebral body height loss. Atherosclerotic calcification is seen in the aorta. Remaining soft tissues are unremarkable. Included portions of the pelvis are free of acute abnormality. IMPRESSION: 1. Multilevel degenerative changes, maximal L4-S1. Foraminal and canal stenoses are better assessed on recent MRI 04/14/2019. 2. No acute osseous abnormality. Electronically Signed   By: Kreg ShropshirePrice  DeHay M.D.   On: 06/02/2019 02:47   Ct Renal Stone Study  Result Date: 06/02/2019 CLINICAL DATA:  48 year old female with flank pain. EXAM: CT ABDOMEN AND PELVIS WITHOUT CONTRAST TECHNIQUE: Multidetector CT imaging of the abdomen and pelvis was  performed following the standard protocol without IV contrast. COMPARISON:  Lumbar MRI 04/14/2019. FINDINGS: Lower chest: Negative. Hepatobiliary: Negative noncontrast liver and gallbladder. Pancreas: Negative. Spleen: Negative. Adrenals/Urinary Tract: Normal adrenal glands. No nephrolithiasis. No hydronephrosis or perinephric stranding.  Suggestion of a small left renal midpole cyst. Decompressed and negative left ureter. Decompressed and negative right ureter. Several tiny pelvic phleboliths. Unremarkable urinary bladder. Stomach/Bowel: Decompressed rectosigmoid colon. Negative descending colon and transverse colon. Redundant hepatic flexure. Negative right colon and appendix (series 2, image 58). Negative terminal ileum. No dilated small bowel. Negative stomach and duodenum. No free air. No free fluid. Vascular/Lymphatic: Vascular patency is not evaluated in the absence of IV contrast. Aortoiliac calcified atherosclerosis. No lymphadenopathy. Reproductive: Fibroid uterus suspected, lower uterine segment indistinct hypodense masslike area measures about 25 millimeters diameter. Negative noncontrast ovaries. Other: No pelvic free fluid. Musculoskeletal: Lower thoracic and upper lumbar spina bifida occulta (normal variant). Advanced lumbosacral junction disc degeneration. No acute osseous abnormality identified. IMPRESSION: 1. No urinary calculus or obstructive uropathy. 2. Fibroid uterus suspected. 3. Normal appendix. No acute or inflammatory process identified in the non-contrast abdomen or pelvis. Electronically Signed   By: Odessa Fleming M.D.   On: 06/02/2019 03:51    Procedures Procedures (including critical care time)  Medications Ordered in ED Medications  ketorolac (TORADOL) injection 60 mg (has no administration in time range)  HYDROcodone-acetaminophen (NORCO/VICODIN) 5-325 MG per tablet 1 tablet (has no administration in time range)  methocarbamol (ROBAXIN) tablet 500 mg (has no administration in time range)     Initial Impression / Assessment and Plan / ED Course  I have reviewed the triage vital signs and the nursing notes.  Pertinent labs & imaging results that were available during my care of the patient were reviewed by me and considered in my medical decision making (see chart for details).       Acute on chronic back  pain with worsening pain tonight as well as radiation of pain into right leg which is new.  Intact distal pulses, sensation, reflexes and strength.  Low suspicion for cord compression or cauda equina  MRI 04/14/19: IMPRESSION: Lumbar spine spondylosis most notable at L4-L5 with a left foraminal disc protrusion contacting the exiting left L4 nerve root, and severe left neural foraminal narrowing. Also at L5-S1 there is a central disc protrusion with an annular fissure which contacts bilateral descending S1 nerve roots without impingement.  Patient given muscle relaxers and pain control in the ED.  She is able to ambulate.  Her urinalysis is negative for hematuria or infection.  Abdominal imaging was obtained given her complaint of abdominal pain worsening back pain as it was no recent abdominal imaging in the system.  There is no evidence of AAA or other acute abdominal pathology.  Patient agreeable to course of steroids to treat for sciatica.  Follow-up with PCP.  Return precautions discussed including weakness, numbness, bowel or bladder incontinence, fever or vomiting or other concerns.   Final Clinical Impressions(s) / ED Diagnoses   Final diagnoses:  Chronic bilateral low back pain with right-sided sciatica    ED Discharge Orders    None       Sandeep Radell, Jeannett Senior, MD 06/02/19 [phone]

## 2019-07-05 ENCOUNTER — Ambulatory Visit: Payer: Medicaid Other | Admitting: Obstetrics and Gynecology
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
## 2019-07-12 ENCOUNTER — Ambulatory Visit (HOSPITAL_COMMUNITY): Payer: Medicaid Other | Admitting: Psychiatry

## 2019-07-13 ENCOUNTER — Other Ambulatory Visit: Payer: Self-pay

## 2019-07-13 ENCOUNTER — Ambulatory Visit (INDEPENDENT_AMBULATORY_CARE_PROVIDER_SITE_OTHER): Payer: Medicaid Other | Admitting: Psychiatry

## 2019-07-13 ENCOUNTER — Encounter (HOSPITAL_COMMUNITY): Payer: Self-pay | Admitting: Psychiatry

## 2019-07-13 DIAGNOSIS — F330 Major depressive disorder, recurrent, mild: Secondary | ICD-10-CM | POA: Diagnosis not present

## 2019-07-13 MED ORDER — VENLAFAXINE HCL ER 150 MG PO CP24: ORAL_CAPSULE | ORAL | 2 refills | Status: DC

## 2019-07-13 MED ORDER — BUSPIRONE HCL 5 MG PO TABS: 5.0000 mg | ORAL_TABLET | Freq: Three times a day (TID) | ORAL | 2 refills | Status: DC

## 2019-07-13 MED ORDER — LORAZEPAM 0.5 MG PO TABS: 0.5000 mg | ORAL_TABLET | Freq: Every day | ORAL | 2 refills | Status: DC | PRN

## 2019-07-13 MED ORDER — TRAZODONE HCL 100 MG PO TABS: 100.0000 mg | ORAL_TABLET | Freq: Every evening | ORAL | 2 refills | Status: DC | PRN

## 2019-07-13 MED ORDER — VENLAFAXINE HCL ER 75 MG PO CP24: ORAL_CAPSULE | ORAL | 2 refills | Status: DC

## 2019-07-13 NOTE — Patient Instructions (Signed)
1.Increase venlafaxine 225 mg  2 Continue Buspar5 mg three times a day 3.Continue lorazepam0.5 mg daily as needed for anxiety  4.ContinueTrazodone100mg  at night as needed for sleep 5.Next appointment: in January

## 2019-10-08 ENCOUNTER — Other Ambulatory Visit: Payer: Self-pay

## 2019-10-08 ENCOUNTER — Ambulatory Visit: Payer: Medicaid Other | Attending: Internal Medicine

## 2019-10-08 DIAGNOSIS — Z20822 Contact with and (suspected) exposure to covid-19: Secondary | ICD-10-CM

## 2019-10-09 LAB — NOVEL CORONAVIRUS, NAA: SARS-CoV-2, NAA: NOT DETECTED

## 2019-10-13 ENCOUNTER — Encounter: Payer: Self-pay | Admitting: Obstetrics and Gynecology

## 2019-10-13 ENCOUNTER — Ambulatory Visit: Payer: Medicaid Other | Admitting: Obstetrics and Gynecology

## 2019-10-13 ENCOUNTER — Other Ambulatory Visit: Payer: Self-pay

## 2019-10-13 ENCOUNTER — Ambulatory Visit: Payer: Medicaid Other | Admitting: Psychiatry

## 2019-10-13 VITALS — BP 126/70 | HR 77 | Ht 67.0 in | Wt 225.0 lb

## 2019-10-13 DIAGNOSIS — N920 Excessive and frequent menstruation with regular cycle: Secondary | ICD-10-CM

## 2019-10-13 DIAGNOSIS — N946 Dysmenorrhea, unspecified: Secondary | ICD-10-CM

## 2019-10-13 DIAGNOSIS — Q513 Bicornate uterus: Secondary | ICD-10-CM

## 2019-10-13 MED ORDER — NORETHINDRONE 0.35 MG PO TABS: 1.0000 | ORAL_TABLET | Freq: Every day | ORAL | 11 refills | Status: DC

## 2019-10-13 MED ORDER — IBUPROFEN 800 MG PO TABS: 800.0000 mg | ORAL_TABLET | Freq: Three times a day (TID) | ORAL | 2 refills | Status: DC | PRN

## 2019-10-13 NOTE — Patient Instructions (Signed)
Dysmenorrhea Dysmenorrhea means painful cramps during your period (menstrual period). You will have pain in your lower belly (abdomen). The pain is caused by the tightening (contracting) of the muscles of the womb (uterus). The pain may be mild or very bad. With this condition, you may:  Have a headache.  Feel sick to your stomach (nauseous).  Throw up (vomit).  Have lower back pain. Follow these instructions at home: Helping pain and cramping   Put heat on your lower back or belly when you have pain or cramps. Use the heat source that your doctor tells you to use. ? Place a towel between your skin and the heat. ? Leave the heat on for 20-30 minutes. ? Remove the heat if your skin turns bright red. This is especially important if you cannot feel pain, heat, or cold. ? Do not have a heating pad on during sleep.  Do aerobic exercises. These include walking, swimming, or biking. These may help with cramps.  Massage your lower back or belly. This may help lessen pain. General instructions  Take over-the-counter and prescription medicines only as told by your doctor.  Do not drive or use heavy machinery while taking prescription pain medicine.  Avoid alcohol and caffeine during and right before your period. These can make cramps worse.  Do not use any products that have nicotine or tobacco. These include cigarettes and e-cigarettes. If you need help quitting, ask your doctor.  Keep all follow-up visits as told by your doctor. This is important. Contact a doctor if:  You have pain that gets worse.  You have pain that does not get better with medicine.  You have pain during sex.  You feel sick to your stomach or you throw up during your period, and medicine does not help. Get help right away if:  You pass out (faint). Summary  Dysmenorrhea means painful cramps during your period (menstrual period).  Put heat on your lower back or belly when you have pain or cramps.  Do  exercises like walking, swimming, or biking to help with cramps.  Contact a doctor if you have pain during sex. This information is not intended to replace advice given to you by your health care provider. Make sure you discuss any questions you have with your health care provider. Document Revised: 08/15/2017 Document Reviewed: 09/19/2016 Elsevier Patient Education  2020 [employer]  

## 2019-10-13 NOTE — Progress Notes (Signed)
Ms Paszkiewicz presents today with c/o painful cycles. Known problem for pt but Sx have increased since October. Cycles are once a month, last 6-7 days. Motrin helps some. Has seen Dr Emelda Fear in the past. Was prescribed Micronor. Pt states unable to obtain form pharmacy U/S bicornate uterus. H/O c sections x 3 and BTL Unsuccessful attempt to place IUD in the past H/O CBP following fall LMP current Denies any bowel or bladder dysfunction Last pap 2020 normal per pt  PE AF VSS Lungs clear Heart RRR Abd soft + BS GU nl EGBUS light menses noted uterus small tender no adnexal masses Limited by pt habitus  A/P Dysmenorrhea        Bicornate uterus        Menorrhea with regular cycles  Discussed starting Micronor. Pt is agreeable. Rx sent to pharmacy plus Motrin. Pt instructed to start taking this Sunday. U/R/B discussed. Instructed to contact office if any problems filling Rx.  F/U in 3 months

## 2019-10-18 ENCOUNTER — Other Ambulatory Visit (HOSPITAL_COMMUNITY): Payer: Self-pay | Admitting: *Deleted

## 2019-10-18 MED ORDER — BUSPIRONE HCL 5 MG PO TABS: 5.0000 mg | ORAL_TABLET | Freq: Three times a day (TID) | ORAL | 2 refills | Status: DC

## 2019-10-18 MED ORDER — VENLAFAXINE HCL ER 150 MG PO CP24: ORAL_CAPSULE | ORAL | 2 refills | Status: DC

## 2019-10-18 MED ORDER — TRAZODONE HCL 100 MG PO TABS: 100.0000 mg | ORAL_TABLET | Freq: Every evening | ORAL | 2 refills | Status: DC | PRN

## 2019-10-18 MED ORDER — LORAZEPAM 0.5 MG PO TABS: 0.5000 mg | ORAL_TABLET | Freq: Every day | ORAL | 2 refills | Status: DC | PRN

## 2019-10-18 MED ORDER — VENLAFAXINE HCL ER 75 MG PO CP24: ORAL_CAPSULE | ORAL | 2 refills | Status: DC

## 2019-10-18 NOTE — Telephone Encounter (Signed)
Prescriptions sent

## 2019-10-18 NOTE — Addendum Note (Signed)
Addended by: Zena Amos on: 10/18/2019 10:55 AM   Modules accepted: Orders

## 2019-10-18 NOTE — Telephone Encounter (Signed)
Patient called stated next appt is 11/03/19 & she didn't know if she had any refills. Per Rx patient has  0 refills on psych med's

## 2019-11-03 ENCOUNTER — Encounter: Payer: Self-pay | Admitting: Psychiatry

## 2019-11-03 ENCOUNTER — Ambulatory Visit (INDEPENDENT_AMBULATORY_CARE_PROVIDER_SITE_OTHER): Payer: Medicaid Other | Admitting: Psychiatry

## 2019-11-03 ENCOUNTER — Other Ambulatory Visit: Payer: Self-pay

## 2019-11-03 DIAGNOSIS — F419 Anxiety disorder, unspecified: Secondary | ICD-10-CM | POA: Diagnosis not present

## 2019-11-03 DIAGNOSIS — F3341 Major depressive disorder, recurrent, in partial remission: Secondary | ICD-10-CM

## 2019-11-03 MED ORDER — BUSPIRONE HCL 10 MG PO TABS: 10.0000 mg | ORAL_TABLET | Freq: Three times a day (TID) | ORAL | 1 refills | Status: DC

## 2019-11-03 MED ORDER — CLONAZEPAM 0.5 MG PO TABS: 0.5000 mg | ORAL_TABLET | Freq: Two times a day (BID) | ORAL | 1 refills | Status: DC

## 2019-11-03 NOTE — Progress Notes (Signed)
[facility] MD OP Progress Note  I connected with  Tiffany Morgan on 11/03/19 by a video enabled telemedicine application and verified that I am speaking with the correct person using two identifiers.   I discussed the limitations of evaluation and management by telemedicine. The patient expressed understanding and agreed to proceed.    11/03/2019 1:30 PM DORAL DIGANGI  MRN:  [id number]  Chief Complaint:  " I still have a lot of anxiety."  HPI: Patient reported that she has not noticed a significant improvement in her symptoms of anxiety since her dose of Effexor was increased to 225 mg in November.  She informed that she still feels like her heart is racing fast and is going to come out of her chest so she wakes up in the morning.  She takes 1 dose of lorazepam 0.5 mg at that time which seems to help only for a little bit.  She stated that she is dealing with a lot of chronic pain issues.  She informed that she is working with her gynecologist and they are doing a work-up to rule out endometriosis.  She also informed that she has chronic back pain issues which have evaluated by specialists but nothing has really been done to help her with the pain.   She also reported that her anxiety is under control due to the ongoing COVID-19 pandemic and the isolation due to that.  She informed that her daughter's basketball and she has traveled with her and as result they have had to quarantine once which was not good.  She informed that her teenage daughter can be difficult with her at times which does not help much. Patient was agreeable to increasing dose of BuSpar to 10 mg twice daily and switching lorazepam to clonazepam for optimal control of anxiety. She denied any suicidal ideations.  She stated that she is hoping that once her pain is better controlled things will improve.  She uses over-the-counter ibuprofen to help her with pain.  Visit Diagnosis:    ICD-10-CM   1. MDD (major depressive disorder),  recurrent, in partial remission ([place])  F33.41   2. Anxiety disorder, unspecified type  F41.9     Past Psychiatric History: depression, anxiety  Past Medical History:  Past Medical History:  Diagnosis Date  . Anxiety   . Arthritis    spine  . Chronic back pain    started about 6 years ago - four bulging discs  . Depression   . Essential hypertension 08/22/2017    Past Surgical History:  Procedure Laterality Date  . BREAST BIOPSY Left 01/31/2016   benign  . BREAST BIOPSY Left 08/17/2013   benign  . CESAREAN SECTION     3  . TUBAL LIGATION      Family Psychiatric History: see below  Family History:  Family History  Problem Relation Age of Onset  . Hypertension Mother   . Depression Mother   . Hyperlipidemia Mother   . Heart disease Mother   . Heart attack Mother   . Cancer Father        pancreatic  . Mental illness Sister        schizoid  . Diabetes Sister   . Schizophrenia Sister   . Cancer Maternal Grandmother        myleodysplastic syndrome  . Depression Maternal Grandmother   . Myelodysplastic syndrome Maternal Grandmother   . Diabetes Maternal Grandmother   . Hypertension Maternal Grandmother   . Alcohol abuse Maternal  Grandfather     Social History:  Social History   Socioeconomic History  . Marital status: Single    Spouse name: Not on file  . Number of children: 2  . Years of education: 76  . Highest education level: Not on file  Occupational History  . 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 Smoking status: Current Every Day Smoker    Packs/day: 0.50    Years: 23.00    Pack years: 11.50    Types: Cigarettes    Start date: 09/17/1995  . Smokeless tobacco: Never Used  . Tobacco comment: trying to quit  Substance and Sexual Activity  . Alcohol use: No  . Drug use: Yes    Types: Marijuana    Comment: 2 joints per week  . Sexual activity: Not Currently    Birth control/protection: Surgical    Comment: tubal  Other Topics  Concern  . Not on file  Social History Narrative   Single   Lives with [name] [name] unable to work due to back pain   Social Determinants of Health   Financial Resource Strain:   . Difficulty of Paying Living Expenses: Not on file  Food Insecurity:   . Worried About Programme researcher, broadcasting/film/video in the Last Year: Not on file  . Ran Out of Food in the Last Year: Not on file  Transportation Needs:   . Lack of Transportation (Medical): Not on file  . Lack of Transportation (Non-Medical): Not on file  Physical Activity:   . Days of Exercise per Week: Not on file  . Minutes of Exercise per Session: Not on file  Stress:   . Feeling of Stress : Not on file  Social Connections:   . Frequency of Communication with Friends and Family: Not on file  . Frequency of Social Gatherings with Friends and Family: Not on file  . Attends Religious Services: Not on file  . Active Member of Clubs or Organizations: Not on file  . Attends Banker Meetings: Not on file  . Marital Status: Not on file    Allergies: No Known Allergies  Metabolic Disorder Labs: No results found for: HGBA1C, MPG No results found for: PROLACTIN Lab Results  Component Value Date   CHOL 216 (H) 02/19/2017   TRIG 179 (H) 02/19/2017   HDL 32 (L) 02/19/2017   CHOLHDL 6.8 (H) 02/19/2017   VLDL 36 (H) 02/19/2017   LDLCALC 148 (H) 02/19/2017   Lab Results  Component Value Date   TSH 2.87 07/21/2017   TSH 0.89 02/19/2017    Therapeutic Level Labs: No results found for: LITHIUM No results found for: VALPROATE No components found for:  CBMZ  Current Medications: Current Outpatient Medications  Medication Sig Dispense Refill  . busPIRone (BUSPAR) 5 MG tablet Take 1 tablet (5 mg total) by mouth 3 (three) times daily. 90 tablet 2  . gabapentin (NEURONTIN) 300 MG capsule TAKE 1 CAPSULE BY MOUTH THREE TIMES DAILY. 90 capsule 0  . ibuprofen (ADVIL) 800 MG tablet Take 1 tablet (800 mg total) by mouth every 8  (eight) hours as needed. 30 tablet 2  . LORazepam (ATIVAN) 0.5 MG tablet Take 1 tablet (0.5 mg total) by mouth daily as needed for anxiety. 30 tablet 2  . methocarbamol (ROBAXIN) 500 MG tablet Take 1 tablet (500 mg total) by mouth every 8 (eight) hours as needed for muscle spasms. 20 tablet 0  . metoprolol tartrate (  LOPRESSOR) 25 MG tablet Take 25 mg by mouth daily.    . norethindrone (MICRONOR) 0.35 MG tablet Take 1 tablet (0.35 mg total) by mouth daily. 1 Package 11  . rosuvastatin (CRESTOR) 10 MG tablet TAKE (1) TABLET BY MOUTHTAT BEDTIME.    . simvastatin (ZOCOR) 40 MG tablet Take 40 mg by mouth daily.    . SUMAtriptan (IMITREX) 50 MG tablet Take 50 mg by mouth 2 (two) times daily as needed.    . traZODone (DESYREL) 100 MG tablet Take 1 tablet (100 mg total) by mouth at bedtime as needed for sleep. 30 tablet 2  . venlafaxine XR (EFFEXOR XR) 75 MG 24 hr capsule 225 mg daily (150 mg + 75 mg) 30 capsule 2  . venlafaxine XR (EFFEXOR-XR) 150 MG 24 hr capsule 225 mg daily (150 mg + 75 mg) 30 capsule 2  . Vitamin D, Ergocalciferol, (DRISDOL) 1.25 MG (50000 UT) CAPS capsule Take 50,000 Units by mouth every 7 (seven) days.     No current facility-administered medications for this visit.     Musculoskeletal: Strength & Muscle Tone: unable to assess due to telemed visit Gait & Station: unable to assess due to telemed visit Patient leans: unable to assess due to telemed visit    Psychiatric Specialty Exam: Review of Systems  There were no vitals taken for this visit.There is no height or weight on file to calculate BMI.  General Appearance: Fairly Groomed  Eye Contact:  Good  Speech:  Clear and Coherent and Normal Rate  Volume:  Normal  Mood:  Slightly depressed  Affect:  Sad-looking  Thought Process:  Goal Directed, Linear and Descriptions of Associations: Intact  Orientation:  Full (Time, Place, and Person)  Thought Content: Logical   Suicidal Thoughts:  No  Homicidal Thoughts:  No   Memory:  Recent;   Good Remote;   Good  Judgement:  Fair  Insight:  Fair  Psychomotor Activity:  Normal  Concentration:  Concentration: Good and Attention Span: Good  Recall:  Good  Fund of Knowledge: Good  Language: Good  Akathisia:  Negative  Handed:  Right  AIMS (if indicated): not done  Assets:  Communication Skills Desire for Improvement Financial Resources/Insurance Housing  ADL's:  Intact  Cognition: WNL  Sleep:  Fair     Screenings: GAD-7     Counselor from 11/24/2017 in [facility]  Total GAD-7 Score  17    PHQ2-9     [facility] Counselor from 11/24/2017 [facility] Office Visit from 07/21/2017 in [facility] Office Visit from 06/05/2017 in [facility] Office Visit from 01/16/2017 in [facility]  PHQ-2 Total Score  3  2  0  0  0  PHQ-9 Total Score  12  14  --  --  --       Assessment and Plan: 49 year old female with history of MDD and anxiety in addition to chronic pain, hypertension, arthritis who was seen for follow-up.  Patient reported that she still is dealing with a lot of anxiety issues and does not find her medications to be helpful.  She is also dealing with a lot of chronic pain issues which are contributing to her anxiety.  She was agreeable to increasing the dose of BuSpar to 10 mg 3 times daily and to switch to lorazepam to clonazepam for optimal anxiety management. I have reviewed the PDMP during this encounter.  1. MDD (major depressive disorder), recurrent, in partial remission ([facility]) - Continue Effexor 225 mg daily, prescription sent on 10/18/2019. - Continue Trazodone 100 mg HS, prescription sent on 10/18/2019. - Increase busPIRone (BUSPAR) 10 MG tablet; Take 1 tablet (10 mg total) by mouth 3 (three) times daily.  Dispense: 90 tablet; Refill: 1  2. Anxiety  disorder, unspecified type  - busPIRone (BUSPAR) 10 MG tablet; Take 1 tablet (10 mg total) by mouth 3 (three) times daily.  Dispense: 90 tablet; Refill: 1 -Discontinue lorazepam due to lack of efficacy - Start clonazePAM (KLONOPIN) 0.5 MG tablet; Take 1 tablet (0.5 mg total) by mouth 2 (two) times daily.  Dispense: 60 tablet; Refill: 1  F/up with Dr. Vanetta Shawl in 6 weeks.   Tiffany Amos, MD 11/03/2019, 1:30 PM
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
## 2019-12-14 ENCOUNTER — Encounter (HOSPITAL_COMMUNITY): Payer: Self-pay | Admitting: Psychiatry

## 2019-12-14 ENCOUNTER — Ambulatory Visit (INDEPENDENT_AMBULATORY_CARE_PROVIDER_SITE_OTHER): Payer: Medicaid Other | Admitting: Psychiatry

## 2019-12-14 ENCOUNTER — Other Ambulatory Visit: Payer: Self-pay

## 2019-12-14 DIAGNOSIS — F330 Major depressive disorder, recurrent, mild: Secondary | ICD-10-CM

## 2019-12-14 MED ORDER — BUSPIRONE HCL 15 MG PO TABS: 15.0000 mg | ORAL_TABLET | Freq: Three times a day (TID) | ORAL | 1 refills | Status: DC

## 2019-12-14 MED ORDER — VENLAFAXINE HCL ER 75 MG PO CP24: ORAL_CAPSULE | ORAL | 0 refills | Status: DC

## 2019-12-14 MED ORDER — TRAZODONE HCL 100 MG PO TABS: 100.0000 mg | ORAL_TABLET | Freq: Every evening | ORAL | 0 refills | Status: DC | PRN

## 2019-12-14 MED ORDER — VENLAFAXINE HCL ER 150 MG PO CP24: ORAL_CAPSULE | ORAL | 0 refills | Status: DC

## 2019-12-14 NOTE — Patient Instructions (Signed)
1. Continue venlafaxine 225 mg  2 Increase Buspar15 mg three times a day 3.Continue Clonazepam 0.5 mg twice a day as needed for anxiety   4.ContinueTrazodone100mg  at night as needed for sleep 5.Next appointment:5/25 at 1 PM

## 2020-01-03 ENCOUNTER — Telehealth (HOSPITAL_COMMUNITY): Payer: Self-pay | Admitting: *Deleted

## 2020-01-03 ENCOUNTER — Other Ambulatory Visit (HOSPITAL_COMMUNITY): Payer: Self-pay | Admitting: Psychiatry

## 2020-01-03 DIAGNOSIS — F419 Anxiety disorder, unspecified: Secondary | ICD-10-CM

## 2020-01-03 MED ORDER — CLONAZEPAM 0.5 MG PO TABS: 0.5000 mg | ORAL_TABLET | Freq: Two times a day (BID) | ORAL | 1 refills | Status: DC

## 2020-01-03 NOTE — Telephone Encounter (Signed)
PATIENT CALLED Rx INFORMED 0/REFILLS  0/ ON HOLD OR FILE FOR clonazePAM (KLONOPIN) 0.5 MG tablet

## 2020-01-03 NOTE — Telephone Encounter (Signed)
Ordered

## 2020-01-11 ENCOUNTER — Ambulatory Visit: Payer: Medicaid Other | Admitting: Obstetrics & Gynecology

## 2020-01-13 ENCOUNTER — Ambulatory Visit: Payer: Self-pay
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
## 2020-02-07 ENCOUNTER — Ambulatory Visit: Payer: Medicaid Other

## 2020-02-07 DIAGNOSIS — Z23 Encounter for immunization: Secondary | ICD-10-CM

## 2020-02-07 NOTE — Progress Notes (Signed)
Covid-19 Vaccination Clinic  Name:  Tiffany Morgan    MRN: [id number] DOB: Jan 17, 1971  02/07/2020  [name] was observed post Covid-19 immunization for 15 minutes without incident. She was provided with Vaccine Information Sheet and instruction to access the V-Safe system.   [name] was instructed to call 911 with any severe reactions post vaccine: Marland Kitchen Difficulty breathing  . Swelling of face and throat  . A fast heartbeat  . A bad rash all over body  . Dizziness and weakness   Immunizations Administered    Name Date Dose VIS Date Route   Moderna COVID-19 Vaccine 02/07/2020  1:18 PM 0.5 mL 08/2019 Intramuscular   Manufacturer: Moderna   Lot: [id number]   NDC: 59563-875-64

## 2020-02-08 ENCOUNTER — Other Ambulatory Visit: Payer: Self-pay

## 2020-02-08 ENCOUNTER — Telehealth (INDEPENDENT_AMBULATORY_CARE_PROVIDER_SITE_OTHER): Payer: Medicaid Other | Admitting: Psychiatry

## 2020-02-08 ENCOUNTER — Encounter (HOSPITAL_COMMUNITY): Payer: Self-pay | Admitting: Psychiatry

## 2020-02-08 DIAGNOSIS — F4310 Post-traumatic stress disorder, unspecified: Secondary | ICD-10-CM

## 2020-02-08 DIAGNOSIS — F330 Major depressive disorder, recurrent, mild: Secondary | ICD-10-CM | POA: Diagnosis not present

## 2020-02-08 DIAGNOSIS — F419 Anxiety disorder, unspecified: Secondary | ICD-10-CM

## 2020-02-08 MED ORDER — BUSPIRONE HCL 10 MG PO TABS: 10.0000 mg | ORAL_TABLET | Freq: Three times a day (TID) | ORAL | 1 refills | Status: DC

## 2020-02-08 MED ORDER — CLONAZEPAM 0.5 MG PO TABS: 0.5000 mg | ORAL_TABLET | Freq: Two times a day (BID) | ORAL | 1 refills | Status: DC

## 2020-02-08 NOTE — Patient Instructions (Addendum)
1. Continue venlafaxine225 mg 2 Increase Buspar20 mg three times a day 3.Continue Clonazepam 0.5 mg twice a day as needed for anxiety   4.ContinueTrazodone100mg  at night as needed for sleep 5.Next appointment:7/21 at 10:20

## 2020-02-21 ENCOUNTER — Telehealth (HOSPITAL_COMMUNITY): Payer: Self-pay | Admitting: *Deleted

## 2020-02-21 ENCOUNTER — Encounter (HOSPITAL_COMMUNITY): Payer: Self-pay | Admitting: *Deleted

## 2020-02-21 ENCOUNTER — Other Ambulatory Visit (HOSPITAL_COMMUNITY): Payer: Self-pay | Admitting: Psychiatry

## 2020-02-21 MED ORDER — VENLAFAXINE HCL ER 150 MG PO CP24: ORAL_CAPSULE | ORAL | 1 refills | Status: DC

## 2020-02-21 MED ORDER — VENLAFAXINE HCL ER 75 MG PO CP24: ORAL_CAPSULE | ORAL | 1 refills | Status: DC

## 2020-02-21 NOTE — Telephone Encounter (Signed)
Ordered refills

## 2020-02-21 NOTE — Telephone Encounter (Signed)
noted 

## 2020-02-21 NOTE — Telephone Encounter (Signed)
Patient called stating she is out of her venlafaxine XR (EFFEXOR-XR) 150 mg and 75 mg  medication for her due to it not having any refills on it. Per pt she took her last pill on Friday. Per pt she is out of her medication. Pt number [phone].

## 2020-03-01 ENCOUNTER — Other Ambulatory Visit (HOSPITAL_COMMUNITY): Payer: Self-pay | Admitting: Psychiatry

## 2020-03-01 ENCOUNTER — Encounter (HOSPITAL_COMMUNITY): Payer: Self-pay | Admitting: *Deleted

## 2020-03-01 MED ORDER — BUSPIRONE HCL 10 MG PO TABS: 20.0000 mg | ORAL_TABLET | Freq: Three times a day (TID) | ORAL | 1 refills | Status: DC

## 2020-03-01 NOTE — Telephone Encounter (Signed)
I misunderstood that it was 90 days order.  New order of 20 mg TID is sent.

## 2020-03-03 ENCOUNTER — Other Ambulatory Visit (HOSPITAL_COMMUNITY): Payer: Self-pay | Admitting: Psychiatry

## 2020-03-03 ENCOUNTER — Telehealth (HOSPITAL_COMMUNITY): Payer: Self-pay | Admitting: *Deleted

## 2020-03-03 MED ORDER — TRAZODONE HCL 100 MG PO TABS: 100.0000 mg | ORAL_TABLET | Freq: Every evening | ORAL | 1 refills | Status: DC | PRN

## 2020-03-03 NOTE — Telephone Encounter (Signed)
Per Dr. Vanetta Shawl to inform patient with Buspar New Directions :It is still 10 mg tabs as we do not have 20 mg tabs. Advise her to take two of 10 mg tabs three times a day, which makes 20 mg TID. It should be also on her new medication bottle/order.  Information was given to patient and she verbalized understanding for her Buspar.

## 2020-03-06 ENCOUNTER — Ambulatory Visit: Payer: Medicaid Other

## 2020-03-06 DIAGNOSIS — Z23 Encounter for immunization: Secondary | ICD-10-CM

## 2020-03-06 NOTE — Progress Notes (Signed)
Covid-19 Vaccination Clinic  Name:  Tiffany Morgan    MRN: [id number] DOB: December 19, 1970  03/06/2020  [name] was observed post Covid-19 immunization for 15 minutes without incident. She was provided with Vaccine Information Sheet and instruction to access the V-Safe system.   [name] was instructed to call 911 with any severe reactions post vaccine: Marland Kitchen Difficulty breathing  . Swelling of face and throat  . A fast heartbeat  . A bad rash all over body  . Dizziness and weakness   Immunizations Administered    Name Date Dose VIS Date Route   Moderna COVID-19 Vaccine 03/06/2020  1:17 PM 0.5 mL 08/2019 Intramuscular   Manufacturer: Moderna   Lot: [id number]   NDC: 30865-784-69

## 2020-03-17 ENCOUNTER — Other Ambulatory Visit (HOSPITAL_COMMUNITY): Payer: Self-pay | Admitting: *Deleted

## 2020-03-17 DIAGNOSIS — D72829 Elevated white blood cell count, unspecified: Secondary | ICD-10-CM

## 2020-03-17 DIAGNOSIS — D72820 Lymphocytosis (symptomatic): Secondary | ICD-10-CM

## 2020-03-21 ENCOUNTER — Inpatient Hospital Stay (HOSPITAL_COMMUNITY): Payer: Medicaid Other | Attending: Hematology

## 2020-03-21 ENCOUNTER — Other Ambulatory Visit (HOSPITAL_COMMUNITY): Payer: Self-pay | Admitting: Obstetrics and Gynecology

## 2020-03-21 DIAGNOSIS — Z1231 Encounter for screening mammogram for malignant neoplasm of breast: Secondary | ICD-10-CM

## 2020-03-22 ENCOUNTER — Encounter (HOSPITAL_COMMUNITY): Payer: Medicaid Other

## 2020-03-24 ENCOUNTER — Ambulatory Visit (HOSPITAL_COMMUNITY): Payer: Medicaid Other | Admitting: Hematology

## 2020-04-05 ENCOUNTER — Telehealth (HOSPITAL_COMMUNITY): Payer: Medicaid Other | Admitting: Psychiatry

## 2020-04-11 NOTE — Progress Notes (Signed)
PATIENT ID: Tiffany Morgan, female     DOB: December 03, 1970, 49 y.o.     MRN: [id number]   [facility] Visit  04/11/20     PATIENT NAME: Tiffany Morgan     MRN [id number]     DOB: 26-Nov-1970  CC & HPI:  No chief complaint on file.  JAQUANNA DAMER is a 49 y.o. female presenting today for menopausal symptoms and some bladder problems with intermittent leakage.  Today's urine shows microscopic hematuria.  Culture will be sent    ROS:  She has a boil on her bottom that is extremely tender on the right gluteal crease, been there several days.  She is not on any antibiotics.  After visualization, I&D is recommended  Pertinent History Reviewed:  Reviewed: Significant for  Medical         Past Medical History:  Diagnosis Date  . Anxiety   . Arthritis    spine  . Chronic back pain    started about 6 years ago - four bulging discs  . Depression   . Essential hypertension 08/22/2017                              Surgical Hx:    Past Surgical History:  Procedure Laterality Date  . BREAST BIOPSY Left 01/31/2016   benign  . BREAST BIOPSY Left 08/17/2013   benign  . CESAREAN SECTION     3  . TUBAL LIGATION     Medications: Reviewed & Updated - see associated section                       Current Outpatient Medications:  .  busPIRone (BUSPAR) 10 MG tablet, Take 2 tablets (20 mg total) by mouth 3 (three) times daily., Disp: 180 tablet, Rfl: 1 .  clonazePAM (KLONOPIN) 0.5 MG tablet, Take 1 tablet (0.5 mg total) by mouth 2 (two) times daily., Disp: 60 tablet, Rfl: 1 .  gabapentin (NEURONTIN) 300 MG capsule, TAKE 1 CAPSULE BY MOUTH THREE TIMES DAILY., Disp: 90 capsule, Rfl: 0 .  ibuprofen (ADVIL) 800 MG tablet, Take 1 tablet (800 mg total) by mouth every 8 (eight) hours as needed., Disp: 30 tablet, Rfl: 2 .  methocarbamol (ROBAXIN) 500 MG tablet, Take 1 tablet (500 mg total) by mouth every 8 (eight) hours as needed for muscle spasms., Disp: 20 tablet, Rfl: 0 .  metoprolol  tartrate (LOPRESSOR) 25 MG tablet, Take 25 mg by mouth 2 (two) times daily. , Disp: , Rfl:  .  norethindrone (MICRONOR) 0.35 MG tablet, Take 1 tablet (0.35 mg total) by mouth daily., Disp: 1 Package, Rfl: 11 .  rosuvastatin (CRESTOR) 10 MG tablet, TAKE (1) TABLET BY MOUTHTAT BEDTIME., Disp: , Rfl:  .  simvastatin (ZOCOR) 40 MG tablet, Take 40 mg by mouth daily., Disp: , Rfl:  .  SUMAtriptan (IMITREX) 50 MG tablet, Take 50 mg by mouth 2 (two) times daily as needed., Disp: , Rfl:  .  traZODone (DESYREL) 100 MG tablet, Take 1 tablet (100 mg total) by mouth at bedtime as needed for sleep., Disp: 30 tablet, Rfl: 1 .  venlafaxine XR (EFFEXOR XR) 75 MG 24 hr capsule, 225 mg daily (150 mg + 75 mg), Disp: 30 capsule, Rfl: 1 .  venlafaxine XR (EFFEXOR-XR) 150 MG 24 hr capsule, 225 mg daily (150 mg + 75 mg), Disp: 30 capsule, Rfl: 1 .  Vitamin  D, Ergocalciferol, (DRISDOL) 1.25 MG (50000 UT) CAPS capsule, Take 50,000 Units by mouth every 7 (seven) days., Disp: , Rfl:    Social History: Reviewed -  reports that she has been smoking cigarettes. She started smoking about 24 years ago. She has a 11.50 pack-year smoking history. She has never used smokeless tobacco.  Objective Findings:  Vitals: There were no vitals taken for this visit. BP 124/71 (BP Location: Left Arm, Patient Position: Sitting, Cuff Size: Normal)   Pulse 84   Ht 5\' 7"  (1.702 m)   Wt (!) 227 lb 6.4 oz (103.1 kg)   BMI 35.62 kg/m  PHYSICAL EXAMINATION General appearance - alert, well appearing, and in no distress, oriented to person, place, and time and overweight Mental status - alert, oriented to person, place, and time, normal mood, behavior, speech, dress, motor activity, and thought processes Chest -  Heart - normal rate and regular rhythm Abdomen - soft, nontender, nondistended, no masses or organomegaly Breasts -  Skin - normal coloration and turgor, no rashes, no suspicious skin lesions noted LESIONS NOTED: nodules on the right  buttock just inside the right gluteal crease, parallel with perineal body, a 2 cm firm area of induration with some hemorrhagic darkness right over the course of the abscess.  No skin breakdown yet but it is coming  PELVIC External genitalia - boiil right buttock crease consent obtained for incision and drainage Vulva -  Vagina -normal Cervix -   Uterus -  Adnexa -nontender [place] -not applicable Rectal -   Assessment & Plan:   A: Normal pelvic exam 1. Right buttock abscess 2.   P:  1. Procedure note: After consent obtained, perineum was prepped with Betadine, and sprayed with Gebauer's freezing spray, and a L-shaped incision and drainage performed with a #15 blade obtaining approximately 10 cc of purulent material and a core of debris with almost immediate improvement in discomfort.  Patient was placed on Keflex 2. Keflex 500 4 times daily x7 days   By signing my name below, I, [employer], attest that this documentation has been prepared under the direction and in the presence of Tilda Burrow, MD. Electronically Signed: Mal Misty Medical Scribe. 04/11/20. 11:03 PM.  I personally performed the services described in this documentation, which was SCRIBED in my presence. The recorded information has been reviewed and considered accurate. It has been edited as necessary during review. Tilda Burrow, MD

## 2020-04-12 ENCOUNTER — Encounter: Payer: Self-pay | Admitting: Obstetrics and Gynecology

## 2020-04-12 ENCOUNTER — Ambulatory Visit (INDEPENDENT_AMBULATORY_CARE_PROVIDER_SITE_OTHER): Payer: Medicaid Other | Admitting: Obstetrics and Gynecology

## 2020-04-12 VITALS — BP 124/71 | HR 84 | Ht 67.0 in | Wt 227.4 lb

## 2020-04-12 DIAGNOSIS — Q513 Bicornate uterus: Secondary | ICD-10-CM

## 2020-04-12 DIAGNOSIS — R3589 Other polyuria: Secondary | ICD-10-CM

## 2020-04-12 DIAGNOSIS — R311 Benign essential microscopic hematuria: Secondary | ICD-10-CM

## 2020-04-12 DIAGNOSIS — L0231 Cutaneous abscess of buttock: Secondary | ICD-10-CM

## 2020-04-12 LAB — POCT URINALYSIS DIPSTICK OB
Glucose, UA: NEGATIVE
Ketones, UA: NEGATIVE
Leukocytes, UA: NEGATIVE
Nitrite, UA: NEGATIVE
POC,PROTEIN,UA: NEGATIVE

## 2020-04-12 MED ORDER — TOLTERODINE TARTRATE ER 2 MG PO CP24: 2.0000 mg | ORAL_CAPSULE | Freq: Every day | ORAL | 3 refills | Status: DC

## 2020-04-13 ENCOUNTER — Other Ambulatory Visit: Payer: Self-pay | Admitting: *Deleted

## 2020-04-13 ENCOUNTER — Telehealth: Payer: Self-pay | Admitting: Obstetrics and Gynecology

## 2020-04-13 MED ORDER — CEPHALEXIN 500 MG PO CAPS: 500.0000 mg | ORAL_CAPSULE | Freq: Four times a day (QID) | ORAL | 0 refills | Status: DC

## 2020-04-13 NOTE — Telephone Encounter (Signed)
Patient informed Keflex has been sent in.

## 2020-04-13 NOTE — Telephone Encounter (Signed)
Patient called in saying the MD was going to prescribe her two meds and when she called the pharmacy was told that he only sent one. She wants to know is he still going to prescibe her some antibiotics.

## 2020-04-14 LAB — URINE CULTURE

## 2020-04-19 ENCOUNTER — Telehealth: Payer: Self-pay | Admitting: Obstetrics and Gynecology

## 2020-04-19 NOTE — Telephone Encounter (Signed)
Patient has a question about some meds that were prescribed to her.

## 2020-04-19 NOTE — Telephone Encounter (Signed)
Attempted to call patient back no answer and no option to leave vm. Will send mychart message.
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
## 2020-04-20 ENCOUNTER — Other Ambulatory Visit: Payer: Self-pay | Admitting: Adult Health

## 2020-04-20 ENCOUNTER — Other Ambulatory Visit (HOSPITAL_COMMUNITY): Payer: Self-pay | Admitting: Psychiatry

## 2020-04-20 MED ORDER — VENLAFAXINE HCL ER 75 MG PO CP24: ORAL_CAPSULE | ORAL | 0 refills | Status: DC

## 2020-04-20 MED ORDER — VENLAFAXINE HCL ER 150 MG PO CP24: ORAL_CAPSULE | ORAL | 0 refills | Status: DC

## 2020-04-20 MED ORDER — OXYBUTYNIN CHLORIDE 5 MG PO TABS: 5.0000 mg | ORAL_TABLET | Freq: Three times a day (TID) | ORAL | 3 refills | Status: DC

## 2020-04-20 NOTE — Progress Notes (Signed)
rx oxybutynin

## 2020-04-24 ENCOUNTER — Encounter (HOSPITAL_COMMUNITY): Payer: Self-pay | Admitting: Psychiatry

## 2020-04-24 ENCOUNTER — Telehealth (INDEPENDENT_AMBULATORY_CARE_PROVIDER_SITE_OTHER): Payer: Medicaid Other | Admitting: Psychiatry

## 2020-04-24 ENCOUNTER — Other Ambulatory Visit: Payer: Self-pay

## 2020-04-24 DIAGNOSIS — G4700 Insomnia, unspecified: Secondary | ICD-10-CM | POA: Diagnosis not present

## 2020-04-24 DIAGNOSIS — F4310 Post-traumatic stress disorder, unspecified: Secondary | ICD-10-CM

## 2020-04-24 DIAGNOSIS — F330 Major depressive disorder, recurrent, mild: Secondary | ICD-10-CM

## 2020-04-24 DIAGNOSIS — F419 Anxiety disorder, unspecified: Secondary | ICD-10-CM

## 2020-04-24 MED ORDER — VENLAFAXINE HCL ER 150 MG PO CP24: ORAL_CAPSULE | ORAL | 0 refills | Status: DC

## 2020-04-24 MED ORDER — TRAZODONE HCL 100 MG PO TABS: 100.0000 mg | ORAL_TABLET | Freq: Every evening | ORAL | 0 refills | Status: DC | PRN

## 2020-04-24 MED ORDER — CLONAZEPAM 0.5 MG PO TABS: 0.5000 mg | ORAL_TABLET | Freq: Two times a day (BID) | ORAL | 2 refills | Status: DC

## 2020-04-24 MED ORDER — VENLAFAXINE HCL ER 75 MG PO CP24: ORAL_CAPSULE | ORAL | 0 refills | Status: DC

## 2020-04-24 NOTE — Patient Instructions (Signed)
1. Continuevenlafaxine225 mg 2 CotninueBuspar20mg  three times a day  3.Continue Clonazepam 0.5 mg twice a day as needed for anxiety  4.ContinueTrazodone100mg  at night as needed for sleep 5.Next appointment:11/1 at 11:20

## 2020-04-27 ENCOUNTER — Encounter (HOSPITAL_COMMUNITY): Payer: Medicaid Other

## 2020-05-03 ENCOUNTER — Telehealth (HOSPITAL_COMMUNITY): Payer: Self-pay | Admitting: *Deleted

## 2020-05-03 ENCOUNTER — Encounter (HOSPITAL_COMMUNITY): Payer: Self-pay | Admitting: *Deleted

## 2020-05-03 ENCOUNTER — Other Ambulatory Visit (HOSPITAL_COMMUNITY): Payer: Self-pay | Admitting: Psychiatry

## 2020-05-03 MED ORDER — VENLAFAXINE HCL ER 75 MG PO CP24: ORAL_CAPSULE | ORAL | 2 refills | Status: DC

## 2020-05-03 MED ORDER — BUSPIRONE HCL 10 MG PO TABS: 20.0000 mg | ORAL_TABLET | Freq: Three times a day (TID) | ORAL | 2 refills | Status: DC

## 2020-05-03 MED ORDER — VENLAFAXINE HCL ER 150 MG PO CP24: ORAL_CAPSULE | ORAL | 2 refills | Status: DC

## 2020-05-03 NOTE — Telephone Encounter (Signed)
Patient called stating that provider did not put refills on her buspar and Effexor. Per pt come the 6th of the month her Effexor will be out as well. Per pt she just don't want to run out of her medication.

## 2020-05-03 NOTE — Telephone Encounter (Signed)
Message sent to patient via mychart

## 2020-05-03 NOTE — Telephone Encounter (Signed)
Sorry I missed to do it. I ordered refills of both buspar and venlafaxine.

## 2020-06-26 ENCOUNTER — Telehealth: Payer: Self-pay | Admitting: Adult Health

## 2020-06-26 NOTE — Telephone Encounter (Signed)
Patient called, states the oxybutin made her mouth very dry, wonders if that's a side effect & would like to try tolterodine States that Dr. Emelda Fear ordered this for her in the past & she would like to try it again Please advise    [place]

## 2020-07-05 ENCOUNTER — Ambulatory Visit: Payer: Self-pay | Admitting: Adult Health
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
## 2020-07-17 ENCOUNTER — Other Ambulatory Visit: Payer: Self-pay

## 2020-07-17 ENCOUNTER — Telehealth (INDEPENDENT_AMBULATORY_CARE_PROVIDER_SITE_OTHER): Payer: Medicaid Other | Admitting: Psychiatry

## 2020-07-17 ENCOUNTER — Encounter (HOSPITAL_COMMUNITY): Payer: Self-pay | Admitting: Psychiatry

## 2020-07-17 DIAGNOSIS — F419 Anxiety disorder, unspecified: Secondary | ICD-10-CM

## 2020-07-17 DIAGNOSIS — F330 Major depressive disorder, recurrent, mild: Secondary | ICD-10-CM | POA: Diagnosis not present

## 2020-07-17 DIAGNOSIS — F4310 Post-traumatic stress disorder, unspecified: Secondary | ICD-10-CM

## 2020-07-17 DIAGNOSIS — G4700 Insomnia, unspecified: Secondary | ICD-10-CM

## 2020-07-17 MED ORDER — TRAZODONE HCL 100 MG PO TABS: 100.0000 mg | ORAL_TABLET | Freq: Every evening | ORAL | 2 refills | Status: DC | PRN

## 2020-07-17 MED ORDER — VENLAFAXINE HCL ER 150 MG PO CP24
ORAL_CAPSULE | ORAL | 2 refills | Status: DC
Start: 2020-07-17 — End: 2020-10-16

## 2020-07-17 MED ORDER — BUPROPION HCL ER (XL) 150 MG PO TB24: 150.0000 mg | ORAL_TABLET | Freq: Every day | ORAL | 1 refills | Status: DC

## 2020-07-17 MED ORDER — CLONAZEPAM 0.5 MG PO TABS: 0.5000 mg | ORAL_TABLET | Freq: Two times a day (BID) | ORAL | 1 refills | Status: DC

## 2020-07-17 MED ORDER — BUSPIRONE HCL 10 MG PO TABS
20.0000 mg | ORAL_TABLET | Freq: Three times a day (TID) | ORAL | 2 refills | Status: DC
Start: 2020-08-01 — End: 2020-10-16

## 2020-07-17 MED ORDER — VENLAFAXINE HCL ER 75 MG PO CP24: ORAL_CAPSULE | ORAL | 2 refills | Status: DC

## 2020-07-17 NOTE — Patient Instructions (Signed)
1. Continuevenlafaxine225 mg 2. Start bupropion 150 mg daily  2 ContinueBuspar20mg  three times a day  3.Continue Clonazepam 0.5 mg twice a day as needed for anxiety  4.ContinueTrazodone100mg  at night as needed for sleep 5.Next appointment:12/20 at 2:50
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
## 2020-09-01 ENCOUNTER — Telehealth: Payer: Medicaid Other | Admitting: Nurse Practitioner

## 2020-09-01 DIAGNOSIS — Z760 Encounter for issue of repeat prescription: Secondary | ICD-10-CM | POA: Diagnosis not present

## 2020-09-01 MED ORDER — GABAPENTIN 300 MG PO CAPS: 300.0000 mg | ORAL_CAPSULE | Freq: Three times a day (TID) | ORAL | 0 refills | Status: AC

## 2020-09-01 NOTE — Progress Notes (Signed)
E-Visits are not used to request refills.  After reviewing your records, I can verify that you may be running out of a long term medication before your next scheduled appointment.  Based on this information,  I can refill your (free text) on a one time basis.  Please contact your doctor as soon as possible to manage your prescription.  Meds ordered this encounter  Medications  . gabapentin (NEURONTIN) 300 MG capsule    Sig: Take 1 capsule (300 mg total) by mouth 3 (three) times daily.    Dispense:  90 capsule    Refill:  0    Order Specific Question:   Supervising Provider    Answer:   MILLER, BRIAN [[id number]]   5-10 minutes spent reviewing and documenting in chart.

## 2020-09-04 ENCOUNTER — Other Ambulatory Visit: Payer: Self-pay

## 2020-09-04 ENCOUNTER — Encounter: Payer: Self-pay | Admitting: Psychiatry

## 2020-09-04 ENCOUNTER — Telehealth (INDEPENDENT_AMBULATORY_CARE_PROVIDER_SITE_OTHER): Payer: Medicaid Other | Admitting: Psychiatry

## 2020-09-04 DIAGNOSIS — F330 Major depressive disorder, recurrent, mild: Secondary | ICD-10-CM

## 2020-09-04 DIAGNOSIS — F419 Anxiety disorder, unspecified: Secondary | ICD-10-CM | POA: Diagnosis not present

## 2020-09-04 DIAGNOSIS — G4700 Insomnia, unspecified: Secondary | ICD-10-CM | POA: Diagnosis not present

## 2020-09-04 MED ORDER — CLONAZEPAM 0.5 MG PO TABS: 0.5000 mg | ORAL_TABLET | Freq: Two times a day (BID) | ORAL | 0 refills | Status: DC

## 2020-09-04 NOTE — Patient Instructions (Signed)
1. Continuevenlafaxine225 mg  2 ContinueBuspar20mg  three times a day 3.Continue Clonazepam 0.5 mg twice a day as needed for anxiety 4.ContinueTrazodone100mg  at night as needed for sleep 5.Next appointment:1/31 at [facility]

## 2020-10-16 ENCOUNTER — Telehealth (INDEPENDENT_AMBULATORY_CARE_PROVIDER_SITE_OTHER): Payer: Medicaid Other | Admitting: Psychiatry

## 2020-10-16 ENCOUNTER — Other Ambulatory Visit: Payer: Self-pay

## 2020-10-16 ENCOUNTER — Encounter: Payer: Self-pay | Admitting: Psychiatry

## 2020-10-16 DIAGNOSIS — F3341 Major depressive disorder, recurrent, in partial remission: Secondary | ICD-10-CM

## 2020-10-16 DIAGNOSIS — F419 Anxiety disorder, unspecified: Secondary | ICD-10-CM | POA: Diagnosis not present

## 2020-10-16 DIAGNOSIS — G4700 Insomnia, unspecified: Secondary | ICD-10-CM | POA: Diagnosis not present

## 2020-10-16 IMAGING — DX DG LUMBAR SPINE COMPLETE 4+V
5 series · 5 of 5 positions shown · non-contrast
Comparison: Lumbar MR 04/14/2019, radiograph 05/21/2016

CLINICAL DATA: Worsening chronic lower back pain, no injury

EXAM:
LUMBAR SPINE - COMPLETE 4+ VIEW

[l-spine ap]
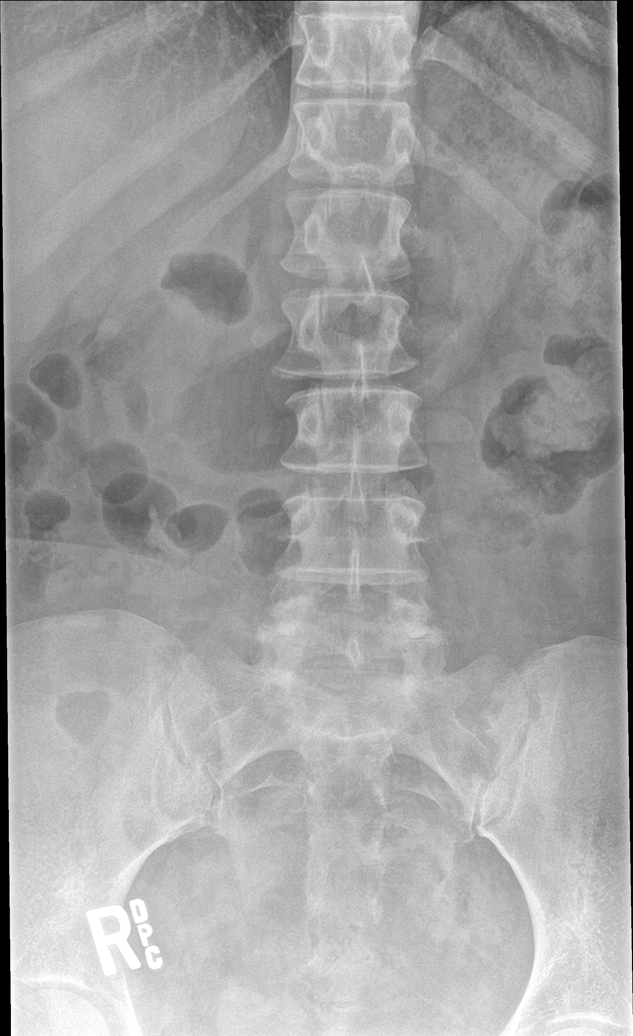

[l-spine obl (1 of 2)]
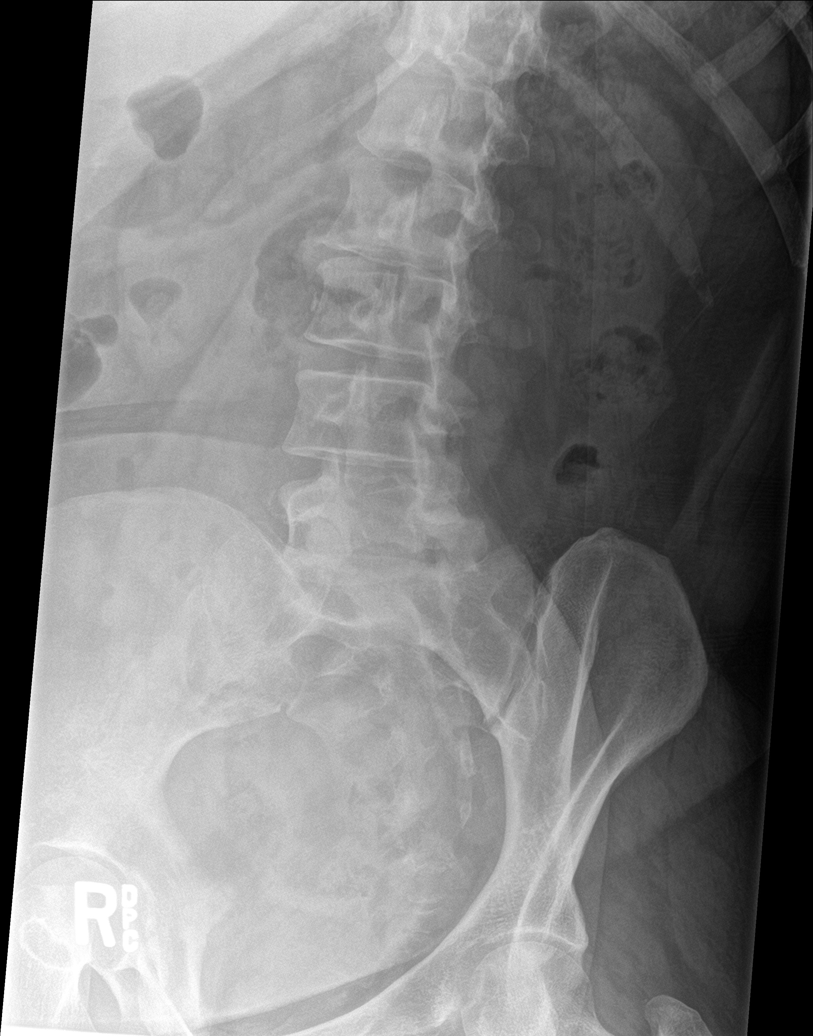

[l-spine obl (2 of 2)]
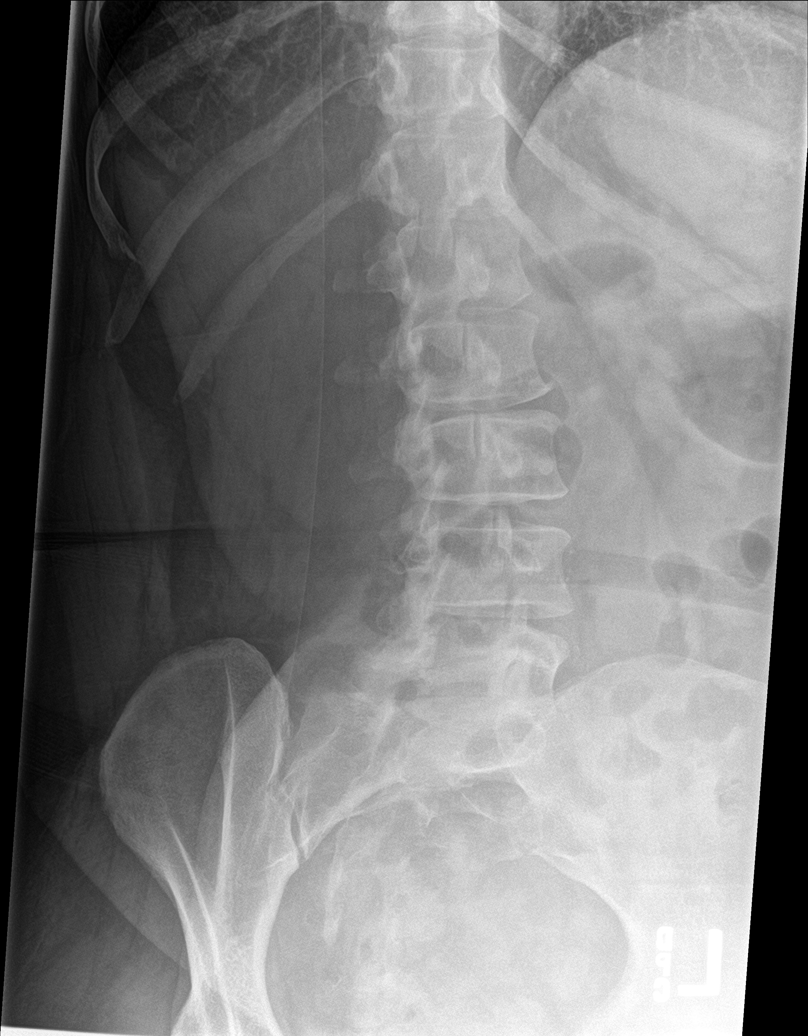

[l-spine lat]
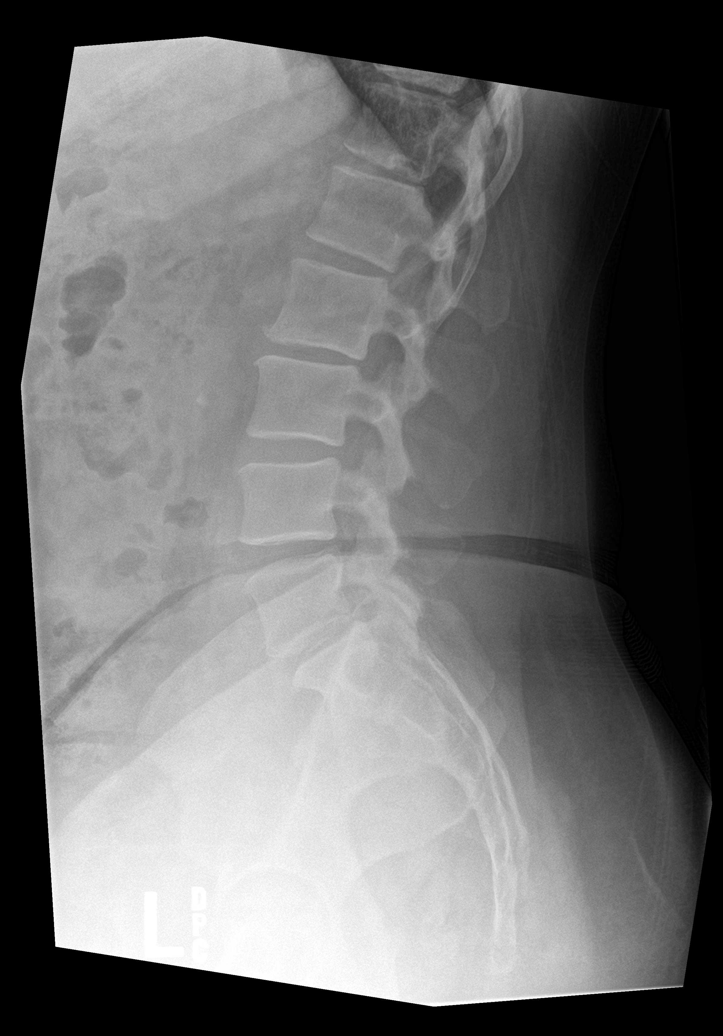

[l-spine spot]
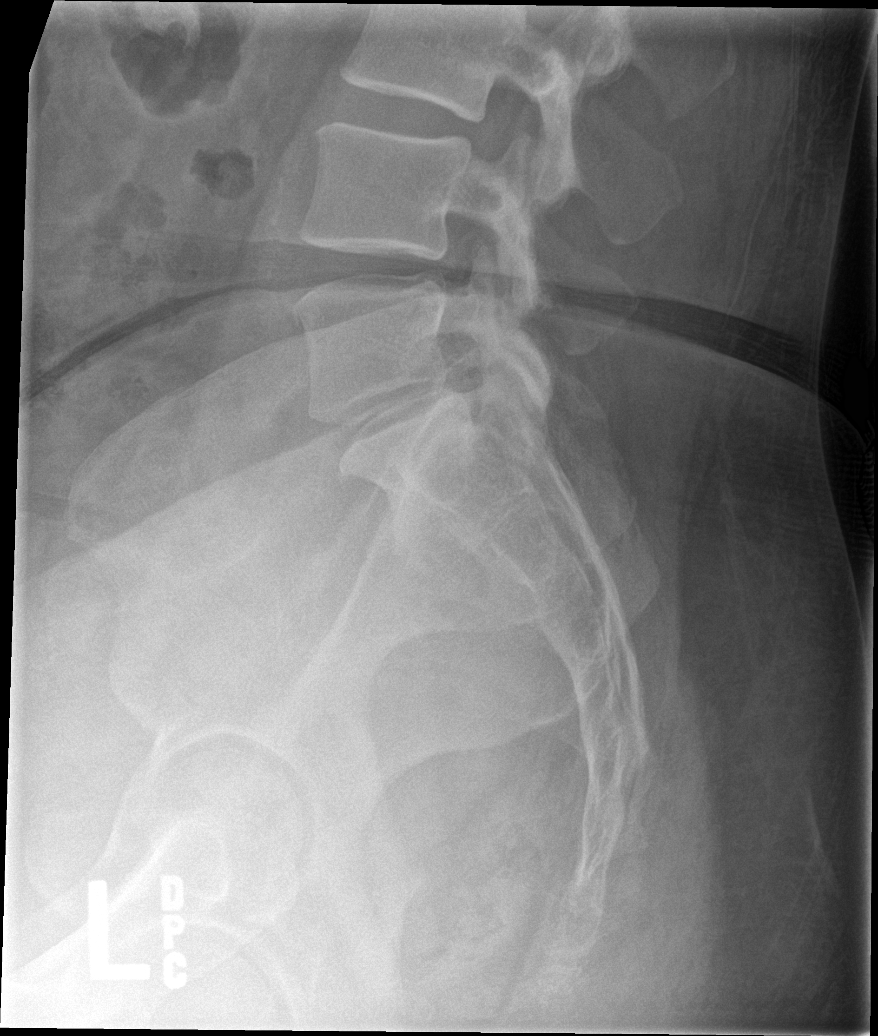

[5 of 5 positions shown; findings below may reference images not displayed]

FINDINGS: Five lumbar type vertebral bodies. Preservation of the normal lumbar
lordosis. Multilevel discogenic and facet degenerative changes are
present maximal L4-S1. No spondylolysis or spondylolisthesis. No
acute fracture or vertebral body height loss. Atherosclerotic
calcification is seen in the aorta. Remaining soft tissues are
unremarkable. Included portions of the pelvis are free of acute
abnormality.
IMPRESSION: 1. Multilevel degenerative changes, maximal L4-S1. Foraminal and
canal stenoses are better assessed on recent MRI 04/14/2019.
[DATE]. No acute osseous abnormality.

## 2020-10-16 MED ORDER — BUPROPION HCL ER (XL) 150 MG PO TB24
150.0000 mg | ORAL_TABLET | Freq: Every day | ORAL | 2 refills | Status: DC
Start: 2020-10-16 — End: 2021-01-23

## 2020-10-16 MED ORDER — VENLAFAXINE HCL ER 150 MG PO CP24: ORAL_CAPSULE | ORAL | 2 refills | Status: DC

## 2020-10-16 MED ORDER — BUSPIRONE HCL 10 MG PO TABS: 20.0000 mg | ORAL_TABLET | Freq: Three times a day (TID) | ORAL | 2 refills | Status: DC

## 2020-10-16 MED ORDER — VENLAFAXINE HCL ER 75 MG PO CP24: ORAL_CAPSULE | ORAL | 2 refills | Status: DC

## 2020-10-16 MED ORDER — TRAZODONE HCL 100 MG PO TABS: 100.0000 mg | ORAL_TABLET | Freq: Every evening | ORAL | 2 refills | Status: DC | PRN

## 2020-10-16 MED ORDER — CLONAZEPAM 0.5 MG PO TABS: 0.5000 mg | ORAL_TABLET | Freq: Two times a day (BID) | ORAL | 1 refills | Status: DC | PRN
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
## 2020-10-16 NOTE — Patient Instructions (Signed)
1. Continuevenlafaxine225 mg 2 ContinueBuspar20mg  three times a day 3.Continue Clonazepam 0.5 mg twice a day as needed for anxiety 4.ContinueTrazodone100mg  at night as needed for sleep 5.Next appointment: 4/25 at 3:30
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
## 2020-10-17 ENCOUNTER — Telehealth (HOSPITAL_COMMUNITY): Payer: Medicaid Other | Admitting: Psychiatry

## 2020-10-17 ENCOUNTER — Telehealth: Payer: Self-pay | Admitting: Psychiatry

## 2020-11-06 ENCOUNTER — Other Ambulatory Visit (HOSPITAL_COMMUNITY): Payer: Self-pay | Admitting: Internal Medicine

## 2020-11-06 DIAGNOSIS — Z1231 Encounter for screening mammogram for malignant neoplasm of breast: Secondary | ICD-10-CM

## 2020-11-07 ENCOUNTER — Other Ambulatory Visit: Payer: Self-pay

## 2020-11-07 ENCOUNTER — Inpatient Hospital Stay (HOSPITAL_COMMUNITY): Payer: Medicaid Other | Attending: Hematology | Admitting: Hematology

## 2020-11-07 ENCOUNTER — Other Ambulatory Visit (HOSPITAL_COMMUNITY): Payer: Self-pay

## 2020-11-07 VITALS — BP 131/63 | HR 65 | Temp 97.1°F | Resp 18 | Wt 226.4 lb

## 2020-11-07 DIAGNOSIS — R748 Abnormal levels of other serum enzymes: Secondary | ICD-10-CM | POA: Diagnosis not present

## 2020-11-07 DIAGNOSIS — F17210 Nicotine dependence, cigarettes, uncomplicated: Secondary | ICD-10-CM | POA: Diagnosis not present

## 2020-11-07 DIAGNOSIS — D721 Eosinophilia, unspecified: Secondary | ICD-10-CM | POA: Diagnosis not present

## 2020-11-07 DIAGNOSIS — D72820 Lymphocytosis (symptomatic): Secondary | ICD-10-CM

## 2020-11-07 NOTE — Progress Notes (Signed)
[facility] [address] [place], [place] [address]   CLINIC:  Medical Oncology/Hematology  PCP:  Benita StabileHall, John Z, MD [address] [place]/ [place] [address]  [phone]  REASON FOR VISIT:  Follow-up for lymphocytosis  PRIOR THERAPY: None  CURRENT THERAPY: Observation  INTERVAL HISTORY:  Ms. Tiffany Morgan, a 50 y.o. female, returns for routine follow-up for her lymphocytosis. Tiffany Morgan was last seen on 09/28/2018. Her labwork from 10/30/2020 showed WBC 12.7 with lymphocyte 4.8.  Today she reports feeling okay. She denies having any recent infections or F/C, though she is having hot flashes which is possibly due to perimenopause. She is taking Crestor at bedtime and notes having restless legs and leg cramping.  She notes that she will need to have a tooth pulled. She continues smoking 1/2 PPD.   REVIEW OF SYSTEMS:  Review of Systems  Constitutional: Positive for appetite change (75%) and fatigue (25%). Negative for chills and fever.  Endocrine: Positive for hot flashes (perimenopause).  Neurological: Positive for headaches.  All other systems reviewed and are negative.   PAST MEDICAL/SURGICAL HISTORY:  Past Medical History:  Diagnosis Date  . Anxiety   . Arthritis    spine  . Chronic back pain    started about 6 years ago - four bulging discs  . Depression   . Essential hypertension 08/22/2017   Past Surgical History:  Procedure Laterality Date  . BREAST BIOPSY Left 01/31/2016   benign  . BREAST BIOPSY Left 08/17/2013   benign  . CESAREAN SECTION     3  . TUBAL LIGATION      SOCIAL HISTORY:  Social History   Socioeconomic History  . Marital status: Single    Spouse name: Not on file  . Number of children: 2  . Years of education: 5812  . Highest education level: Not on file  Occupational History  . 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 Smoking status: Current Every Day Smoker    Packs/day: 0.50    Years: 23.00     Pack years: 11.50    Types: Cigarettes    Start date: 09/17/1995  . Smokeless tobacco: Never Used  . Tobacco comment: trying to quit  Vaping Use  . Vaping Use: Never used  Substance and Sexual Activity  . Alcohol use: No  . Drug use: Yes    Types: Marijuana    Comment: 2 joints per week  . Sexual activity: Not Currently    Birth control/protection: Surgical    Comment: tubal  Other Topics Concern  . Not on file  Social History Narrative   Single   Lives with [name] Suzzanne CloudLea   Feels unable to work due to back pain   Social Determinants of Health   Financial Resource Strain: Not on file  Food Insecurity: Not on file  Transportation Needs: Not on file  Physical Activity: Not on file  Stress: Not on file  Social Connections: Not on file  Intimate Partner Violence: Not on file    FAMILY HISTORY:  Family History  Problem Relation Age of Onset  . Hypertension Mother   . Depression Mother   . Hyperlipidemia Mother   . Heart disease Mother   . Heart attack Mother   . Cancer Father        pancreatic  . Mental illness Sister        schizoid  . Diabetes Sister   . Schizophrenia Sister   .  Cancer Maternal Grandmother        myleodysplastic syndrome  . Depression Maternal Grandmother   . Myelodysplastic syndrome Maternal Grandmother   . Diabetes Maternal Grandmother   . Hypertension Maternal Grandmother   . Alcohol abuse Maternal Grandfather     CURRENT MEDICATIONS:  Current Outpatient Medications  Medication Sig Dispense Refill  . buPROPion (WELLBUTRIN XL) 150 MG 24 hr tablet Take 1 tablet (150 mg total) by mouth daily. 30 tablet 2  . busPIRone (BUSPAR) 10 MG tablet Take 2 tablets (20 mg total) by mouth 3 (three) times daily. 180 tablet 2  . cephALEXin (KEFLEX) 500 MG capsule Take 1 capsule (500 mg total) by mouth in the morning, at noon, in the evening, and at bedtime. 28 capsule 0  . clonazePAM (KLONOPIN) 0.5 MG tablet Take 1 tablet (0.5 mg total) by mouth 2 (two) times  daily. 60 tablet 0  . [START ON 11/14/2020] clonazePAM (KLONOPIN) 0.5 MG tablet Take 1 tablet (0.5 mg total) by mouth 2 (two) times daily as needed for anxiety. 60 tablet 1  . gabapentin (NEURONTIN) 300 MG capsule Take 1 capsule (300 mg total) by mouth 3 (three) times daily. 90 capsule 0  . hydrOXYzine (VISTARIL) 50 MG capsule Take 100 mg by mouth 2 (two) times daily.    . IBU 600 MG tablet Take 600 mg by mouth every 6 (six) hours as needed.    Marland Kitchen ibuprofen (ADVIL) 800 MG tablet Take 1 tablet (800 mg total) by mouth every 8 (eight) hours as needed. 30 tablet 2  . methocarbamol (ROBAXIN) 500 MG tablet Take 1 tablet (500 mg total) by mouth every 8 (eight) hours as needed for muscle spasms. 20 tablet 0  . metoprolol succinate (TOPROL-XL) 25 MG 24 hr tablet Take by mouth.    . metoprolol tartrate (LOPRESSOR) 25 MG tablet Take 25 mg by mouth 2 (two) times daily.     . norethindrone (MICRONOR) 0.35 MG tablet Take 1 tablet (0.35 mg total) by mouth daily. 1 Package 11  . oxybutynin (DITROPAN) 5 MG tablet Take 1 tablet (5 mg total) by mouth 3 (three) times daily. 90 tablet 3  . pantoprazole (PROTONIX) 40 MG tablet Take 40 mg by mouth daily.    . rosuvastatin (CRESTOR) 10 MG tablet TAKE (1) TABLET BY MOUTHTAT BEDTIME.    . SUMAtriptan (IMITREX) 50 MG tablet Take 50 mg by mouth 2 (two) times daily as needed.    Marland Kitchen tiZANidine (ZANAFLEX) 2 MG tablet Take 2 mg by mouth 2 (two) times daily as needed.    Marland Kitchen tiZANidine (ZANAFLEX) 4 MG tablet Take by mouth.    . tolterodine (DETROL LA) 2 MG 24 hr capsule Take 1 capsule (2 mg total) by mouth daily. 30 capsule 3  . traZODone (DESYREL) 100 MG tablet Take 1 tablet (100 mg total) by mouth at bedtime as needed for sleep. 30 tablet 2  . venlafaxine XR (EFFEXOR XR) 75 MG 24 hr capsule 225 mg daily (150 mg + 75 mg) 30 capsule 2  . venlafaxine XR (EFFEXOR-XR) 150 MG 24 hr capsule 225 mg daily (150 mg + 75 mg) 30 capsule 2  . Vitamin D, Ergocalciferol, (DRISDOL) 1.25 MG (50000  UT) CAPS capsule Take 50,000 Units by mouth every 7 (seven) days.     No current facility-administered medications for this visit.    ALLERGIES:  No Known Allergies  PHYSICAL EXAM:  Performance status (ECOG): 1 - Symptomatic but completely ambulatory  Vitals:   11/07/20 1431  BP: 131/63  Pulse: 65  Resp: 18  Temp: (!) 97.1 F (36.2 C)  SpO2: 99%   Wt Readings from Last 3 Encounters:  11/07/20 226 lb 6.4 oz (102.7 kg)  04/12/20 (!) 227 lb 6.4 oz (103.1 kg)  10/13/19 225 lb (102.1 kg)   Physical Exam Vitals reviewed.  Constitutional:      Appearance: Normal appearance. She is obese.  Cardiovascular:     Rate and Rhythm: Normal rate and regular rhythm.     Pulses: Normal pulses.     Heart sounds: Normal heart sounds.  Pulmonary:     Effort: Pulmonary effort is normal.     Breath sounds: Normal breath sounds.  Chest:  Breasts:     Right: No axillary adenopathy or supraclavicular adenopathy.     Left: No axillary adenopathy or supraclavicular adenopathy.    Abdominal:     Palpations: Abdomen is soft. There is no hepatomegaly, splenomegaly or mass.     Tenderness: There is no abdominal tenderness.     Hernia: No hernia is present.  Musculoskeletal:     Right lower leg: No edema.     Left lower leg: No edema.  Lymphadenopathy:     Upper Body:     Right upper body: No supraclavicular, axillary or pectoral adenopathy.     Left upper body: No supraclavicular, axillary or pectoral adenopathy.     Lower Body: No right inguinal adenopathy. No left inguinal adenopathy.  Neurological:     General: No focal deficit present.     Mental Status: She is alert and oriented to person, place, and time.  Psychiatric:        Mood and Affect: Mood normal.        Behavior: Behavior normal.     LABORATORY DATA:  I have reviewed the labs as listed.  CBC Latest Ref Rng & Units 09/28/2018 05/05/2018 12/09/2017  WBC 4.0 - 10.5 K/uL 12.6(H) 13.2(H) 15.5(H)  Hemoglobin 12.0 - 15.0 g/dL  50.9 32.6 71.2  Hematocrit 36.0 - 46.0 % 40.8 38.9 42.0  Platelets 150 - 400 K/uL 378 369 351   CMP Latest Ref Rng & Units 05/05/2018 07/21/2017 02/19/2017  Glucose 70 - 99 mg/dL 458(K) 91 998(P)  BUN 6 - 20 mg/dL 7 5(L) 6(L)  Creatinine 0.44 - 1.00 mg/dL 3.82 5.05 3.97  Sodium 135 - 145 mmol/L 137 137 140  Potassium 3.5 - 5.1 mmol/L 3.4(L) 4.4 4.0  Chloride 98 - 111 mmol/L 104 103 110  CO2 22 - 32 mmol/L 26 29 23   Calcium 8.9 - 10.3 mg/dL 9.1 9.7 9.1  Total Protein 6.1 - 8.1 g/dL - 7.1 6.4  Total Bilirubin 0.2 - 1.2 mg/dL - 0.3 0.3  Alkaline Phos 33 - 115 U/L - - 97  AST 10 - 35 U/L - 13 10  ALT 6 - 29 U/L - 9 8      Component Value Date/Time   RBC 4.41 09/28/2018 0937   MCV 92.5 09/28/2018 0937   MCH 30.2 09/28/2018 0937   MCHC 32.6 09/28/2018 0937   RDW 14.6 09/28/2018 0937   LYMPHSABS 4.8 (H) 09/28/2018 0937   MONOABS 0.7 09/28/2018 0937   EOSABS 0.6 (H) 09/28/2018 0937   BASOSABS 0.1 09/28/2018 0937    DIAGNOSTIC IMAGING:  I have independently reviewed the scans and discussed with the patient. No results found.   ASSESSMENT:  1.  Leukocytosis:  -This is a combination of neutrophilic and lymphocytic leukocytosis.  Occasional eosinophilia present.  -Flow  cytometry was negative for any lymphoproliferative disorder. -BCR/ABL by FISH and Jak 2 V6 77F mutation testing was negative for myeloproliferative disorders. - It was thought that her smoking was contributing to the leukocytosis.  She is still smoking about half pack per day.   PLAN:  1.    BCR/ABL and JAK2 V677F negative leukocytosis: -Reviewed labs from [facility] dated 10/30/2020.  White count is 12.7 with hemoglobin 13.7 and platelet count 409.  Differential shows increased absolute lymphocyte count of 4.8. -She does not have any B symptoms or palpable adenopathy or splenomegaly. -She is continuing to smoke half pack per day of cigarettes.  This is likely etiology for her reactive lymphocytosis. -She has mildly  elevated alkaline phosphatase most likely from Crestor.  She will follow-up with Dr. Margo Aye about it. -No significant changes in her blood work.  Hence recommended RTC in 1 year with repeat labs.   Orders placed this encounter:  No orders of the defined types were placed in this encounter.    Doreatha Massed, MD [facility] [phone]   I, Drue Second, am acting as a scribe for Dr. Payton Mccallum.  I, Doreatha Massed MD, have reviewed the above documentation for accuracy and completeness, and I agree with the above.

## 2020-11-07 NOTE — Patient Instructions (Signed)
[facility] Discharge Instructions  You were seen today by Dr. Katragadda. He went over your recent results. Your next appointment will be with the physician assistant in 1 year for labs and follow up.   Thank you for choosing [facility] at [facility] to provide your oncology and hematology care.  To afford each patient quality time with our provider, please arrive at least 15 minutes before your scheduled appointment time.   If you have a lab appointment with the Cancer Center please come in thru the Main Entrance and check in at the main information desk  You need to re-schedule your appointment should you arrive 10 or more minutes late.  We strive to give you quality time with our providers, and arriving late affects you and other patients whose appointments are after yours.  Also, if you no show three or more times for appointments you may be dismissed from the clinic at the providers discretion.     Again, thank you for choosing [facility].  Our hope is that these requests will decrease the amount of time that you wait before being seen by our physicians.       _____________________________________________________________  Should you have questions after your visit to [facility], please contact our office at [phone] between the hours of 8:00 a.m. and 4:30 p.m.  Voicemails left after 4:00 p.m. will not be returned until the following business day.  For prescription refill requests, have your pharmacy contact our office and allow 72 hours.    Cancer Center Support Programs:   > Cancer Support Group  2nd Tuesday of the month 1pm-2pm, Journey Room    

## 2020-11-09 ENCOUNTER — Telehealth: Payer: Self-pay

## 2020-11-09 ENCOUNTER — Other Ambulatory Visit: Payer: Self-pay | Admitting: Psychiatry

## 2020-11-09 NOTE — Telephone Encounter (Signed)
She has one refill left, which should have been filled prior to the recent prescription . Please contact the pharmacy and verify this - there should be a refill ordered on 07/17/2020 according to the database.

## 2020-11-09 NOTE — Telephone Encounter (Signed)
pt called states that she last received her rx on  10-07-20 and that she is out. states that the pharmacy told her that she could not fill because you have on rx not to fill until 11-13-20.  She wanted to know why ?  Or if a new rx can be sent so she can get filled

## 2020-11-20 ENCOUNTER — Ambulatory Visit (INDEPENDENT_AMBULATORY_CARE_PROVIDER_SITE_OTHER): Payer: Medicaid Other | Admitting: Women's Health

## 2020-11-20 ENCOUNTER — Other Ambulatory Visit: Payer: Self-pay

## 2020-11-20 ENCOUNTER — Encounter: Payer: Self-pay | Admitting: Women's Health

## 2020-11-20 ENCOUNTER — Other Ambulatory Visit (HOSPITAL_COMMUNITY)
Admission: RE | Admit: 2020-11-20 | Discharge: 2020-11-20 | Disposition: A | Discharge status: Home / Self Care | Payer: Medicaid Other | Source: Ambulatory Visit | Attending: Obstetrics & Gynecology | Admitting: Obstetrics & Gynecology

## 2020-11-20 VITALS — BP 135/81 | HR 77 | Ht 67.0 in | Wt 228.5 lb

## 2020-11-20 DIAGNOSIS — N920 Excessive and frequent menstruation with regular cycle: Secondary | ICD-10-CM | POA: Diagnosis not present

## 2020-11-20 NOTE — Progress Notes (Signed)
GYN VISIT Patient name: Tiffany Morgan MRN [id number]  Date of birth: 08-Dec-1970 Chief Complaint:   bad cramping with period  History of Present Illness:   Tiffany Morgan is a 50 y.o. [phone] African American female being seen today for report of thinking she may be starting to go through menopause. Periods monthly (except did skip one month recently), last about 7 days, last month was heaviest, went through 2 pks of super pads in 5 days, soils clothes, bad cramping, +clots. Stopped taking so many ibuprofen b/c was having a lot of heartburn- so trys to tough out pain, but is really bad.  Also reports mood swings, hot flashes, brain fog. Doesn't know how old mom was when she went through menopause, younger sister already has.  Really interested in hysterectomy if possible. Has tried IUD insertion in past, but cx very stenotic and bicornuate uterus. Micronor did not work. Doesn't really want to do nexplanon or depo. Only sexually active once in last 2 yrs.  Depression screen [facility] 2/9 07/21/2017 06/05/2017 01/16/2017  Decreased Interest 0 0 0  Down, Depressed, Hopeless 0 0 0  PHQ - 2 Score 0 0 0  Some encounter information is confidential and restricted. Go to Review Flowsheets activity to see all data.    Patient's last menstrual period was 11/15/2020. The current method of family planning is tubal ligation.  Last pap 02/25/18. Results were: NILM w/ HRHPV negative Review of Systems:   Pertinent items are noted in HPI Denies fever/chills, dizziness, headaches, visual disturbances, fatigue, shortness of breath, chest pain, abdominal pain, vomiting, abnormal vaginal discharge/itching/odor/irritation, problems with periods, bowel movements, urination, or intercourse unless otherwise stated above.  Pertinent History Reviewed:  Reviewed past medical,surgical, social, obstetrical and family history.  Reviewed problem list, medications and allergies. Physical Assessment:   Vitals:   11/20/20 1033   BP: 135/81  Pulse: 77  Weight: 228 lb 8 oz (103.6 kg)  Height: 5\' 7"  (1.702 m)  Body mass index is 35.79 kg/m.       Physical Examination:   General appearance: alert, well appearing, and in no distress  Mental status: alert, oriented to person, place, and time  Skin: warm & dry   Cardiovascular: normal heart rate noted  Respiratory: normal respiratory effort, no distress  Abdomen: soft, non-tender   Pelvic: VULVA: normal appearing vulva with no masses, tenderness or lesions, VAGINA: normal appearing vagina with normal color and discharge, no lesions, CERVIX: normal appearing cervix without discharge or lesions, UTERUS: uterus is normal size, shape, consistency and nontender, ADNEXA: normal adnexa in size, nontender and no masses  Extremities: no edema   Chaperone: Malachy Mood    No results found for this or any previous visit (from the past 24 hour(s)).  Assessment & Plan:  1) Menorrhagia w/ mostly regular periods> discussed options, really wants hysterectomy. Soils clothes. CV swab sent. Will get u/s and f/u w/ MD  2) Perimenopausal> discussed trying brisdelle for sx, however she is already on antidepressants- she is to check w/ her mental health provider and let me know if they are ok w/ this  Meds: No orders of the defined types were placed in this encounter.   Orders Placed This Encounter  Procedures  . US PELVIS (TRANSABDOMINAL ONLY)  . US PELVIS TRANSVAGINAL NON-OB (TV ONLY)    Return for 1st available, US:GYN & f/u w/ [facility] only after.  THEIA FABER CNM, [facility] 11/20/2020 11:14 AM

## 2020-11-20 NOTE — Patient Instructions (Addendum)
Brisdelle 7.5mg 

## 2020-11-21 LAB — CERVICOVAGINAL ANCILLARY ONLY
Bacterial Vaginitis (gardnerella): POSITIVE — AB
Candida Glabrata: NEGATIVE
Candida Vaginitis: NEGATIVE
Chlamydia: NEGATIVE
Comment: NEGATIVE
Comment: NEGATIVE
Comment: NEGATIVE
Comment: NEGATIVE
Comment: NEGATIVE
Comment: NORMAL
Neisseria Gonorrhea: NEGATIVE
Trichomonas: NEGATIVE

## 2020-11-21 MED ORDER — METRONIDAZOLE 500 MG PO TABS: 500.0000 mg | ORAL_TABLET | Freq: Two times a day (BID) | ORAL | 0 refills | Status: DC

## 2020-11-21 NOTE — Addendum Note (Signed)
Addended by: Cheral Marker on: 11/21/2020 05:10 PM   Modules accepted: Orders

## 2020-11-22 ENCOUNTER — Ambulatory Visit (HOSPITAL_COMMUNITY)
Admission: RE | Admit: 2020-11-22 | Discharge: 2020-11-22 | Disposition: A | Discharge status: Home / Self Care | Payer: Medicaid Other | Source: Ambulatory Visit | Attending: Internal Medicine | Admitting: Internal Medicine

## 2020-11-22 ENCOUNTER — Other Ambulatory Visit: Payer: Self-pay

## 2020-11-22 DIAGNOSIS — Z1231 Encounter for screening mammogram for malignant neoplasm of breast: Secondary | ICD-10-CM | POA: Insufficient documentation

## 2020-11-28 ENCOUNTER — Other Ambulatory Visit (HOSPITAL_COMMUNITY): Payer: Self-pay | Admitting: Internal Medicine

## 2020-11-28 DIAGNOSIS — R928 Other abnormal and inconclusive findings on diagnostic imaging of breast: Secondary | ICD-10-CM

## 2020-12-05 ENCOUNTER — Ambulatory Visit (HOSPITAL_COMMUNITY)
Admission: RE | Admit: 2020-12-05 | Discharge: 2020-12-05 | Disposition: A | Discharge status: Home / Self Care | Payer: Medicaid Other | Source: Ambulatory Visit | Attending: Internal Medicine | Admitting: Internal Medicine

## 2020-12-05 ENCOUNTER — Other Ambulatory Visit: Payer: Self-pay

## 2020-12-05 DIAGNOSIS — R928 Other abnormal and inconclusive findings on diagnostic imaging of breast: Secondary | ICD-10-CM

## 2020-12-14 ENCOUNTER — Ambulatory Visit: Payer: Medicaid Other | Admitting: Obstetrics & Gynecology

## 2020-12-14 ENCOUNTER — Other Ambulatory Visit: Payer: Medicaid Other

## 2020-12-19 ENCOUNTER — Other Ambulatory Visit: Payer: Self-pay | Admitting: Women's Health

## 2020-12-19 MED ORDER — MEGESTROL ACETATE 40 MG PO TABS: ORAL_TABLET | ORAL | 1 refills | Status: DC
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
## 2021-01-08 ENCOUNTER — Telehealth (INDEPENDENT_AMBULATORY_CARE_PROVIDER_SITE_OTHER): Payer: Medicaid Other | Admitting: Psychiatry

## 2021-01-08 ENCOUNTER — Encounter: Payer: Self-pay | Admitting: Psychiatry

## 2021-01-08 ENCOUNTER — Other Ambulatory Visit: Payer: Self-pay

## 2021-01-08 DIAGNOSIS — G4700 Insomnia, unspecified: Secondary | ICD-10-CM

## 2021-01-08 DIAGNOSIS — F330 Major depressive disorder, recurrent, mild: Secondary | ICD-10-CM

## 2021-01-08 DIAGNOSIS — F419 Anxiety disorder, unspecified: Secondary | ICD-10-CM | POA: Diagnosis not present

## 2021-01-08 MED ORDER — VENLAFAXINE HCL ER 150 MG PO CP24: ORAL_CAPSULE | ORAL | 2 refills | Status: DC

## 2021-01-08 MED ORDER — VENLAFAXINE HCL ER 75 MG PO CP24: ORAL_CAPSULE | ORAL | 2 refills | Status: DC

## 2021-01-08 MED ORDER — BUPROPION HCL ER (XL) 150 MG PO TB24: 150.0000 mg | ORAL_TABLET | Freq: Every day | ORAL | 1 refills | Status: DC

## 2021-01-08 MED ORDER — CLONAZEPAM 0.5 MG PO TABS: 0.5000 mg | ORAL_TABLET | Freq: Two times a day (BID) | ORAL | 0 refills | Status: DC | PRN

## 2021-01-08 NOTE — Patient Instructions (Signed)
1. Continuevenlafaxine225 mg 2. Start bupropion 150 mg daily  3. Hold buspar  4. Continue Clonazepam 0.5 mg twice a day as needed for anxiety 5.ContinueTrazodone100mg  at night as needed for sleep 6.Next appointment: 6/2 at 9:30

## 2021-01-11 ENCOUNTER — Telehealth: Payer: Self-pay | Admitting: Women's Health

## 2021-01-11 ENCOUNTER — Other Ambulatory Visit: Payer: Self-pay | Admitting: *Deleted

## 2021-01-11 DIAGNOSIS — N920 Excessive and frequent menstruation with regular cycle: Secondary | ICD-10-CM

## 2021-01-11 NOTE — Telephone Encounter (Signed)
Please put in order so pt can get her GYN U/S at [facility] - front staff will notify pt & schedule her to follow up here after U/S    There was some miscommunication with last MyChart message, front staff never received the message that the pt would take the appointments for today for GYN U/S & visit with Dr. Despina Hidden  (Pt showed up but was not on the schedule)

## 2021-01-17 ENCOUNTER — Other Ambulatory Visit: Payer: Self-pay

## 2021-01-17 ENCOUNTER — Ambulatory Visit (HOSPITAL_COMMUNITY)
Admission: RE | Admit: 2021-01-17 | Discharge: 2021-01-17 | Disposition: A | Discharge status: Home / Self Care | Payer: Medicaid Other | Source: Ambulatory Visit | Attending: Women's Health | Admitting: Women's Health

## 2021-01-17 DIAGNOSIS — N920 Excessive and frequent menstruation with regular cycle: Secondary | ICD-10-CM | POA: Insufficient documentation

## 2021-01-22 ENCOUNTER — Telehealth: Payer: Self-pay

## 2021-01-22 NOTE — Telephone Encounter (Signed)
pt called left message that she is having issues with the new medications you gave her.she states she cant take it she needs something else.

## 2021-01-23 ENCOUNTER — Other Ambulatory Visit: Payer: Self-pay

## 2021-01-23 ENCOUNTER — Other Ambulatory Visit: Payer: Self-pay | Admitting: Psychiatry

## 2021-01-23 ENCOUNTER — Ambulatory Visit: Payer: Medicaid Other | Admitting: Obstetrics & Gynecology

## 2021-01-23 ENCOUNTER — Encounter: Payer: Self-pay | Admitting: Obstetrics & Gynecology

## 2021-01-23 VITALS — BP 131/80 | HR 82 | Ht 67.0 in | Wt 220.0 lb

## 2021-01-23 DIAGNOSIS — Q513 Bicornate uterus: Secondary | ICD-10-CM

## 2021-01-23 DIAGNOSIS — N920 Excessive and frequent menstruation with regular cycle: Secondary | ICD-10-CM

## 2021-01-23 DIAGNOSIS — N946 Dysmenorrhea, unspecified: Secondary | ICD-10-CM

## 2021-01-23 MED ORDER — NORTRIPTYLINE HCL 25 MG PO CAPS: 25.0000 mg | ORAL_CAPSULE | Freq: Every day | ORAL | 0 refills | Status: DC

## 2021-01-23 NOTE — Telephone Encounter (Signed)
Dr. Hisada's pt 

## 2021-01-23 NOTE — Telephone Encounter (Signed)
Medication management - Telephone call back with pt to make sure she had stopped her Buproprion and not Busprone.  Patient reported yes that is what she stopped and is feeling better since stopping.  Informed Dr. Vanetta Shawl had sent in her new prescription for Nortriptyline.  Patient to call back if any further problems.

## 2021-01-23 NOTE — Telephone Encounter (Signed)
Ordered nortriptyline. I wanted to double check that she stopped Bupropion, and not Buspar? Either of the medication can be safely taken with nortriptyline, fyi.

## 2021-01-23 NOTE — Telephone Encounter (Signed)
Advise her to discontinue bupropion and wait to see if her symptoms resolved. If not, please have follow up with her PCP. I would still recommend she will be first evaluated to rule out sleep apnea- referral was made previously. If she is not interested, she can try nortriptyline 25 mg at night along with venlafaxine. Side effects including dry mouth, palpitation, drowsiness, weight gain. Very rare side effect is confusion, tremors (serotonin syndrome).  If she is interested in starting this medication, let me know.

## 2021-01-23 NOTE — Telephone Encounter (Signed)
Medication management - Telephone call with pt who stated she had stopped taking the Buspirone and was feeling better without it.  Pt. reported continued problems with sleep and did want to try nortriptyline for sleep.  Reviewed all potential side effects of this new medication per Dr. Bing Matter instructions and patient to call back if any major problems with the new medication.  Patient agreed to keep appointment set for 02/15/21 and agreed to let Dr. Vanetta Shawl know she wanted to have a prescription of Nortriptyline sent into her [facility] pharmacy to try.

## 2021-01-23 NOTE — Progress Notes (Unsigned)
Follow up appointment for results  No chief complaint on file.   Blood pressure 131/80, pulse 82, height 5\' 7"  (1.702 m), weight 220 lb (99.8 kg), last menstrual period 01/01/2021.  No results found.  ***  MEDS ordered this encounter: No orders of the defined types were placed in this encounter.   Orders for this encounter: No orders of the defined types were placed in this encounter.   Impression: There are no diagnoses linked to this encounter.  Plan: ***  Follow Up: No follow-ups on file.    01/03/2021: 10 min     99213: 15 min      99214:25 min}   Face to face time:  *** minutes  Greater than 50% of the visit time was spent in counseling and coordination of care with the patient.  The summary and outline of the counseling and care coordination is summarized in the note above.   All questions were answered.  Past Medical History:  Diagnosis Date  . Anxiety   . Arthritis    spine  . Bicornate uterus   . Chronic back pain    started about 6 years ago - four bulging discs  . Depression   . Essential hypertension 08/22/2017    Past Surgical History:  Procedure Laterality Date  . BREAST BIOPSY Left 01/31/2016   benign  . BREAST BIOPSY Left 08/17/2013   benign  . CESAREAN SECTION     3  . TUBAL LIGATION      OB History    Gravida  5   Para  2   Term  2   Preterm      AB  3   Living  2     SAB      IAB  3   Ectopic      Multiple      Live Births  2        Obstetric Comments  Cesarean section x 2, due to bicornuate uterus and fetal malpresntaton BREECH, THEN REPEAT        No Known Allergies  Social History   Socioeconomic History  . Marital status: Single    Spouse name: Not on file  . Number of children: 2  . Years of education: 58  . Highest education level: Not on file  Occupational History  . 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 Smoking status: Current Every Day Smoker    Packs/day: 0.50     Years: 23.00    Pack years: 11.50    Types: Cigarettes    Start date: 09/17/1995  . Smokeless tobacco: Never Used  . Tobacco comment: trying to quit  Vaping Use  . Vaping Use: Never used  Substance and Sexual Activity  . Alcohol use: No  . Drug use: Yes    Types: Marijuana    Comment: 2 joints per week  . Sexual activity: Not Currently    Birth control/protection: Surgical    Comment: tubal  Other Topics Concern  . Not on file  Social History Narrative   Single   Lives with [name] 11/15/1995 unable to work due to back pain   Social Determinants of Health   Financial Resource Strain: Not on file  Food Insecurity: Not on file  Transportation Needs: Not on file  Physical Activity: Not on file  Stress: Not on file  Social Connections: Not on file    Family History  Problem Relation Age  of Onset  . Hypertension Mother   . Depression Mother   . Hyperlipidemia Mother   . Heart disease Mother   . Heart attack Mother   . Cancer Father        pancreatic  . Mental illness Sister        schizoid  . Diabetes Sister   . Schizophrenia Sister   . Cancer Maternal Grandmother        myleodysplastic syndrome  . Depression Maternal Grandmother   . Myelodysplastic syndrome Maternal Grandmother   . Diabetes Maternal Grandmother   . Hypertension Maternal Grandmother   . Alcohol abuse Maternal Grandfather

## 2021-01-27 ENCOUNTER — Telehealth: Payer: Medicaid Other | Admitting: Orthopedic Surgery

## 2021-01-27 DIAGNOSIS — X58XXXA Exposure to other specified factors, initial encounter: Secondary | ICD-10-CM

## 2021-01-27 NOTE — Progress Notes (Signed)
Attempted video visit but patient could not unmute herself.

## 2021-01-29 ENCOUNTER — Other Ambulatory Visit: Payer: Self-pay | Admitting: Psychiatry

## 2021-01-29 ENCOUNTER — Telehealth: Payer: Self-pay

## 2021-01-29 MED ORDER — TRAZODONE HCL 100 MG PO TABS: 100.0000 mg | ORAL_TABLET | Freq: Every evening | ORAL | 3 refills | Status: DC | PRN

## 2021-01-29 NOTE — Telephone Encounter (Signed)
Dr. Hisada's pt 

## 2021-01-29 NOTE — Telephone Encounter (Signed)
pt called left a message that she needs a refill on the trazodone.

## 2021-01-29 NOTE — Telephone Encounter (Signed)
Ordered

## 2021-02-07 ENCOUNTER — Telehealth: Payer: Self-pay

## 2021-02-07 ENCOUNTER — Other Ambulatory Visit: Payer: Self-pay | Admitting: Psychiatry

## 2021-02-07 MED ORDER — BUSPIRONE HCL 10 MG PO TABS: 20.0000 mg | ORAL_TABLET | Freq: Three times a day (TID) | ORAL | 0 refills | Status: DC

## 2021-02-07 NOTE — Telephone Encounter (Signed)
Ordered. She self discontinued this medication prior to the last visit. Will check this with her at her next visit.

## 2021-02-07 NOTE — Telephone Encounter (Signed)
pt needs a refills buspar 10mg  ASAP she been out for several days and needs her medication. 
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
## 2021-02-15 ENCOUNTER — Telehealth (INDEPENDENT_AMBULATORY_CARE_PROVIDER_SITE_OTHER): Payer: Medicaid Other | Admitting: Psychiatry

## 2021-02-15 ENCOUNTER — Encounter: Payer: Self-pay | Admitting: Psychiatry

## 2021-02-15 ENCOUNTER — Other Ambulatory Visit: Payer: Self-pay

## 2021-02-15 DIAGNOSIS — F330 Major depressive disorder, recurrent, mild: Secondary | ICD-10-CM

## 2021-02-15 DIAGNOSIS — G4700 Insomnia, unspecified: Secondary | ICD-10-CM | POA: Diagnosis not present

## 2021-02-15 DIAGNOSIS — F419 Anxiety disorder, unspecified: Secondary | ICD-10-CM | POA: Diagnosis not present

## 2021-02-15 MED ORDER — CLONAZEPAM 0.5 MG PO TABS: 0.5000 mg | ORAL_TABLET | Freq: Two times a day (BID) | ORAL | 2 refills | Status: DC | PRN

## 2021-02-15 MED ORDER — ZOLPIDEM TARTRATE 5 MG PO TABS: 5.0000 mg | ORAL_TABLET | Freq: Every evening | ORAL | 1 refills | Status: DC | PRN

## 2021-02-15 MED ORDER — BUSPIRONE HCL 10 MG PO TABS: 20.0000 mg | ORAL_TABLET | Freq: Three times a day (TID) | ORAL | 5 refills | Status: DC

## 2021-02-15 MED ORDER — NORTRIPTYLINE HCL 25 MG PO CAPS: 25.0000 mg | ORAL_CAPSULE | Freq: Every day | ORAL | 1 refills | Status: DC

## 2021-02-15 NOTE — Patient Instructions (Signed)
1. Continue venlafaxine225 mg 2. Continue nortriptyline 25 mg at night 3. Start Ambien 5 mg at night  4. Discontinue bupropion, Trazodone 5. Next appointment: 7/14 at 2 PM

## 2021-02-16 NOTE — Patient Instructions (Signed)
Tiffany Morgan  02/16/2021     @   Your procedure is scheduled on  02/21/2021   Report to [facility] at  1120  A.M.   Call this number if you have problems the morning of surgery:  [phone]   Remember:  Do not eat after midnight.   You may drink clear liquids until 0850 AM. At 0850, drink your carb drink and nothing else to drink after that.                        Take these medicines the morning of surgery with A SIP OF WATER    Buspar, clonazepam, gabapentin, hydroxyzine (if needed), metoprolol, protonix, imitrex (if needed), zanaflex, effexor.     Please brush your teeth.  Do not wear jewelry, make-up or nail polish.  Do not wear lotions, powders, or perfumes, or deodorant.  Do not shave 48 hours prior to surgery.  Men may shave face and neck.  Do not bring valuables to the hospital.  [facility] is not responsible for any belongings or valuables.  Contacts, dentures or bridgework may not be worn into surgery.  Leave your suitcase in the car.  After surgery it may be brought to your room.  For patients admitted to the hospital, discharge time will be determined by your treatment team.  Patients discharged the day of surgery will not be allowed to drive home and must have someone with them for 24 hours.    Special instructions:  DO NOT smoke tobacco or vape for 24 hours before your procedure.  Please read over the following fact sheets that you were given. Coughing and Deep Breathing, Surgical Site Infection Prevention, Anesthesia Post-op Instructions and Care and Recovery After Surgery       Dilation and Curettage or Vacuum Curettage, Care After This sheet gives you information about how to care for yourself after your procedure. Your doctor may also give you more specific instructions. If you have problems or questions, contact your doctor. What can I expect after the procedure? After the procedure, it is common to have:  Mild  pain or cramping.  Some bleeding or spotting from the vagina. These may last for up to 2 weeks. Follow these instructions at home: Medicines  Take over-the-counter and prescription medicines only as told by your doctor. This is very important if you take blood-thinning medicine.  Ask your doctor if the medicine prescribed to you requires you to avoid driving or using machinery. Activity  If you were given a medicine to help you relax (sedative) during your procedure, it can affect you for many hours. Do not drive or use machinery until your doctor says that it is safe.  Rest as told by your doctor.  Do not sit for a long time without moving. Get up to take short walks every 1-2 hours. This is important. Ask for help if you feel weak or unsteady.  Do not lift anything that is heavier than 10 lb (4.5 kg), or the limit that you are told, until your doctor says that it is safe.  Return to your normal activities as told by your doctor. Ask your doctor what activities are safe for you.   Lifestyle For at least 2 weeks, or as long as told by your doctor:  Do not douche.  Do not use tampons.  Do not have sex. General instructions  Wear compression stockings as  told by your doctor.  It is up to you to get the results of your procedure. Ask your doctor, or the department that is doing the procedure, when your results will be ready.  Keep all follow-up visits as told by your doctor. This is important. Contact a doctor if:  You have very bad cramps that get worse or do not get better with medicine.  You have very bad pain in your belly (abdomen).  You cannot drink fluids without vomiting.  You have pain in a different part of your pelvis. The pelvis is the area just above your thighs.  You have fluid from your vagina that smells bad.  You have a rash. Get help right away if:  You are bleeding a lot from your vagina. A lot of bleeding means soaking more than one sanitary pad in  1 hour for 2 hours in a row.  You have a fever that is above 100.44F (38.0C).  Your belly feels very tender or hard.  You have chest pain.  You have trouble breathing.  You feel dizzy.  You feel light-headed.  You pass out (faint).  You have pain in your neck or shoulder area. These symptoms may be an emergency. Do not wait to see if the symptoms will go away. Get medical help right away. Call your local emergency services (911 in the [place] Do not drive yourself to the hospital. Summary  After your procedure, it is common to have pain or cramping. It is also common to have bleeding or spotting from your vagina.  Rest as told. Do not sit for a long time without moving. Get up to take short walks every 1-2 hours.  Do not lift anything that is heavier than 10 lb (4.5 kg), or the limit that you are told.  Contact your doctor if you have fluid from your vagina that smells bad.  Get help right away if you develop any problems from the procedure. Ask your doctor what problems to watch for. This information is not intended to replace advice given to you by your health care provider. Make sure you discuss any questions you have with your health care provider. Document Revised: 10/05/2019 Document Reviewed: 10/05/2019 Elsevier Patient Education  2021 [employer]  General Anesthesia, Adult, Care After This sheet gives you information about how to care for yourself after your procedure. Your health care provider may also give you more specific instructions. If you have problems or questions, contact your health care provider. What can I expect after the procedure? After the procedure, the following side effects are common:  Pain or discomfort at the IV site.  Nausea.  Vomiting.  Sore throat.  Trouble concentrating.  Feeling cold or chills.  Feeling weak or tired.  Sleepiness and fatigue.  Soreness and body aches. These side effects can affect parts of the body that were  not involved in surgery. Follow these instructions at home: For the time period you were told by your health care provider:  Rest.  Do not participate in activities where you could fall or become injured.  Do not drive or use machinery.  Do not drink alcohol.  Do not take sleeping pills or medicines that cause drowsiness.  Do not make important decisions or sign legal documents.  Do not take care of children on your own.   Eating and drinking  Follow any instructions from your health care provider about eating or drinking restrictions.  When you feel hungry, start by eating  small amounts of foods that are soft and easy to digest (bland), such as toast. Gradually return to your regular diet.  Drink enough fluid to keep your urine pale yellow.  If you vomit, rehydrate by drinking water, juice, or clear broth. General instructions  If you have sleep apnea, surgery and certain medicines can increase your risk for breathing problems. Follow instructions from your health care provider about wearing your sleep device: ? Anytime you are sleeping, including during daytime naps. ? While taking prescription pain medicines, sleeping medicines, or medicines that make you drowsy.  Have a responsible adult stay with you for the time you are told. It is important to have someone help care for you until you are awake and alert.  Return to your normal activities as told by your health care provider. Ask your health care provider what activities are safe for you.  Take over-the-counter and prescription medicines only as told by your health care provider.  If you smoke, do not smoke without supervision.  Keep all follow-up visits as told by your health care provider. This is important. Contact a health care provider if:  You have nausea or vomiting that does not get better with medicine.  You cannot eat or drink without vomiting.  You have pain that does not get better with  medicine.  You are unable to pass urine.  You develop a skin rash.  You have a fever.  You have redness around your IV site that gets worse. Get help right away if:  You have difficulty breathing.  You have chest pain.  You have blood in your urine or stool, or you vomit blood. Summary  After the procedure, it is common to have a sore throat or nausea. It is also common to feel tired.  Have a responsible adult stay with you for the time you are told. It is important to have someone help care for you until you are awake and alert.  When you feel hungry, start by eating small amounts of foods that are soft and easy to digest (bland), such as toast. Gradually return to your regular diet.  Drink enough fluid to keep your urine pale yellow.  Return to your normal activities as told by your health care provider. Ask your health care provider what activities are safe for you. This information is not intended to replace advice given to you by your health care provider. Make sure you discuss any questions you have with your health care provider. Document Revised: 05/18/2020 Document Reviewed: 12/16/2019 Elsevier Patient Education  2021 [employer] Inc. How to Use Chlorhexidine for Bathing Chlorhexidine gluconate (CHG) is a germ-killing (antiseptic) solution that is used to clean the skin. It can get rid of the bacteria that normally live on the skin and can keep them away for about 24 hours. To clean your skin with CHG, you may be given:  A CHG solution to use in the shower or as part of a sponge bath.  A prepackaged cloth that contains CHG. Cleaning your skin with CHG may help lower the risk for infection:  While you are staying in the intensive care unit of the hospital.  If you have a vascular access, such as a central line, to provide short-term or long-term access to your veins.  If you have a catheter to drain urine from your bladder.  If you are on a ventilator. A ventilator  is a machine that helps you breathe by moving air in and out of  your lungs.  After surgery. What are the risks? Risks of using CHG include:  A skin reaction.  Hearing loss, if CHG gets in your ears.  Eye injury, if CHG gets in your eyes and is not rinsed out.  The CHG product catching fire. Make sure that you avoid smoking and flames after applying CHG to your skin. Do not use CHG:  If you have a chlorhexidine allergy or have previously reacted to chlorhexidine.  On babies younger than 192 months of age. How to use CHG solution  Use CHG only as told by your health care provider, and follow the instructions on the label.  Use the full amount of CHG as directed. Usually, this is one bottle. During a shower Follow these steps when using CHG solution during a shower (unless your health care provider gives you different instructions): 1. Start the shower. 2. Use your normal soap and shampoo to wash your face and hair. 3. Turn off the shower or move out of the shower stream. 4. Pour the CHG onto a clean washcloth. Do not use any type of brush or rough-edged sponge. 5. Starting at your neck, lather your body down to your toes. Make sure you follow these instructions: ? If you will be having surgery, pay special attention to the part of your body where you will be having surgery. Scrub this area for at least 1 minute. ? Do not use CHG on your head or face. If the solution gets into your ears or eyes, rinse them well with water. ? Avoid your genital area. ? Avoid any areas of skin that have broken skin, cuts, or scrapes. ? Scrub your back and under your arms. Make sure to wash skin folds. 6. Let the lather sit on your skin for 1-2 minutes or as long as told by your health care provider. 7. Thoroughly rinse your entire body in the shower. Make sure that all body creases and crevices are rinsed well. 8. Dry off with a clean towel. Do not put any substances on your body afterward--such as  powder, lotion, or perfume--unless you are told to do so by your health care provider. Only use lotions that are recommended by the manufacturer. 9. Put on clean clothes or pajamas. 10. If it is the night before your surgery, sleep in clean sheets.   During a sponge bath Follow these steps when using CHG solution during a sponge bath (unless your health care provider gives you different instructions): 1. Use your normal soap and shampoo to wash your face and hair. 2. Pour the CHG onto a clean washcloth. 3. Starting at your neck, lather your body down to your toes. Make sure you follow these instructions: ? If you will be having surgery, pay special attention to the part of your body where you will be having surgery. Scrub this area for at least 1 minute. ? Do not use CHG on your head or face. If the solution gets into your ears or eyes, rinse them well with water. ? Avoid your genital area. ? Avoid any areas of skin that have broken skin, cuts, or scrapes. ? Scrub your back and under your arms. Make sure to wash skin folds. 4. Let the lather sit on your skin for 1-2 minutes or as long as told by your health care provider. 5. Using a different clean, wet washcloth, thoroughly rinse your entire body. Make sure that all body creases and crevices are rinsed well. 6. Dry off with a  clean towel. Do not put any substances on your body afterward--such as powder, lotion, or perfume--unless you are told to do so by your health care provider. Only use lotions that are recommended by the manufacturer. 7. Put on clean clothes or pajamas. 8. If it is the night before your surgery, sleep in clean sheets. How to use CHG prepackaged cloths  Only use CHG cloths as told by your health care provider, and follow the instructions on the label.  Use the CHG cloth on clean, dry skin.  Do not use the CHG cloth on your head or face unless your health care provider tells you to.  When washing with the CHG  cloth: ? Avoid your genital area. ? Avoid any areas of skin that have broken skin, cuts, or scrapes. Before surgery Follow these steps when using a CHG cloth to clean before surgery (unless your health care provider gives you different instructions): 1. Using the CHG cloth, vigorously scrub the part of your body where you will be having surgery. Scrub using a back-and-forth motion for 3 minutes. The area on your body should be completely wet with CHG when you are done scrubbing. 2. Do not rinse. Discard the cloth and let the area air-dry. Do not put any substances on the area afterward, such as powder, lotion, or perfume. 3. Put on clean clothes or pajamas. 4. If it is the night before your surgery, sleep in clean sheets.   For general bathing Follow these steps when using CHG cloths for general bathing (unless your health care provider gives you different instructions). 1. Use a separate CHG cloth for each area of your body. Make sure you wash between any folds of skin and between your fingers and toes. Wash your body in the following order, switching to a new cloth after each step: ? The front of your neck, shoulders, and chest. ? Both of your arms, under your arms, and your hands. ? Your stomach and groin area, avoiding the genitals. ? Your right leg and foot. ? Your left leg and foot. ? The back of your neck, your back, and your buttocks. 2. Do not rinse. Discard the cloth and let the area air-dry. Do not put any substances on your body afterward--such as powder, lotion, or perfume--unless you are told to do so by your health care provider. Only use lotions that are recommended by the manufacturer. 3. Put on clean clothes or pajamas. Contact a health care provider if:  Your skin gets irritated after scrubbing.  You have questions about using your solution or cloth. Get help right away if:  Your eyes become very red or swollen.  Your eyes itch badly.  Your skin itches badly and is  red or swollen.  Your hearing changes.  You have trouble seeing.  You have swelling or tingling in your mouth or throat.  You have trouble breathing.  You swallow any chlorhexidine. Summary  Chlorhexidine gluconate (CHG) is a germ-killing (antiseptic) solution that is used to clean the skin. Cleaning your skin with CHG may help to lower your risk for infection.  You may be given CHG to use for bathing. It may be in a bottle or in a prepackaged cloth to use on your skin. Carefully follow your health care provider's instructions and the instructions on the product label.  Do not use CHG if you have a chlorhexidine allergy.  Contact your health care provider if your skin gets irritated after scrubbing. This information is not intended  to replace advice given to you by your health care provider. Make sure you discuss any questions you have with your health care provider. Document Revised: 02/18/2020 Document Reviewed: 02/18/2020 Elsevier Patient Education  2021 [employer].

## 2021-02-18 ENCOUNTER — Other Ambulatory Visit: Payer: Self-pay | Admitting: Obstetrics & Gynecology

## 2021-02-19 ENCOUNTER — Encounter (HOSPITAL_COMMUNITY)
Admission: RE | Admit: 2021-02-19 | Discharge: 2021-02-19 | Disposition: A | Discharge status: Home / Self Care | Payer: Medicaid Other | Source: Ambulatory Visit | Attending: Obstetrics & Gynecology | Admitting: Obstetrics & Gynecology

## 2021-02-19 ENCOUNTER — Other Ambulatory Visit (HOSPITAL_COMMUNITY): Payer: Medicaid Other | Attending: Obstetrics & Gynecology

## 2021-02-19 ENCOUNTER — Other Ambulatory Visit: Payer: Self-pay

## 2021-02-19 ENCOUNTER — Encounter (HOSPITAL_COMMUNITY): Payer: Self-pay

## 2021-02-19 DIAGNOSIS — Z01818 Encounter for other preprocedural examination: Secondary | ICD-10-CM | POA: Diagnosis not present

## 2021-02-19 LAB — URINALYSIS, ROUTINE W REFLEX MICROSCOPIC
Bilirubin Urine: NEGATIVE
Glucose, UA: NEGATIVE mg/dL
Ketones, ur: NEGATIVE mg/dL
Leukocytes,Ua: NEGATIVE
Nitrite: NEGATIVE
Protein, ur: NEGATIVE mg/dL
Specific Gravity, Urine: 1.008 (ref 1.005–1.030)
pH: 6 (ref 5.0–8.0)

## 2021-02-19 LAB — COMPREHENSIVE METABOLIC PANEL
ALT: 13 U/L (ref 0–44)
AST: 11 U/L — ABNORMAL LOW (ref 15–41)
Albumin: 4 g/dL (ref 3.5–5.0)
Alkaline Phosphatase: 109 U/L (ref 38–126)
Anion gap: 7 (ref 5–15)
BUN: 7 mg/dL (ref 6–20)
CO2: 24 mmol/L (ref 22–32)
Calcium: 9.5 mg/dL (ref 8.9–10.3)
Chloride: 109 mmol/L (ref 98–111)
Creatinine, Ser: 0.76 mg/dL (ref 0.44–1.00)
GFR, Estimated: >60 mL/min (ref >60)
Glucose, Bld: 131 mg/dL — ABNORMAL HIGH (ref 70–99)
Potassium: 3.8 mmol/L (ref 3.5–5.1)
Sodium: 140 mmol/L (ref 135–145)
Total Bilirubin: 0.4 mg/dL (ref 0.3–1.2)
Total Protein: 7.6 g/dL (ref 6.5–8.1)

## 2021-02-19 LAB — RAPID URINE DRUG SCREEN, HOSP PERFORMED
Amphetamines: NOT DETECTED
Barbiturates: NOT DETECTED
Benzodiazepines: NOT DETECTED
Cocaine: NOT DETECTED
Opiates: NOT DETECTED
Tetrahydrocannabinol: POSITIVE — AB

## 2021-02-19 LAB — CBC
HCT: 41.5 % (ref 36.0–46.0)
Hemoglobin: 14 g/dL (ref 12.0–15.0)
MCH: 30.4 pg (ref 26.0–34.0)
MCHC: 33.7 g/dL (ref 30.0–36.0)
MCV: 90.2 fL (ref 80.0–100.0)
Platelets: 359 10*3/uL (ref 150–400)
RBC: 4.6 MIL/uL (ref 3.87–5.11)
RDW: 15 % (ref 11.5–15.5)
WBC: 12.5 10*3/uL — ABNORMAL HIGH (ref 4.0–10.5)
nRBC: 0 % (ref 0.0–0.2)

## 2021-02-19 LAB — RAPID HIV SCREEN (HIV 1/2 AB+AG)
HIV 1/2 Antibodies: NONREACTIVE
HIV-1 P24 Antigen - HIV24: NONREACTIVE

## 2021-02-19 LAB — TYPE AND SCREEN
ABO/RH(D): A NEG
Antibody Screen: NEGATIVE

## 2021-02-19 LAB — HCG, QUANTITATIVE, PREGNANCY: hCG, Beta Chain, Quant, S: <1 m[IU]/mL (ref <5)

## 2021-02-21 ENCOUNTER — Ambulatory Visit (HOSPITAL_COMMUNITY): Payer: Medicaid Other | Admitting: Anesthesiology

## 2021-02-21 ENCOUNTER — Ambulatory Visit (HOSPITAL_COMMUNITY)
Admission: RE | Admit: 2021-02-21 | Discharge: 2021-02-21 | Disposition: A | Discharge status: Home / Self Care | Payer: Medicaid Other | Attending: Obstetrics & Gynecology | Admitting: Obstetrics & Gynecology

## 2021-02-21 ENCOUNTER — Encounter (HOSPITAL_COMMUNITY): Payer: Self-pay | Admitting: Obstetrics & Gynecology

## 2021-02-21 ENCOUNTER — Encounter (HOSPITAL_COMMUNITY)
Admission: RE | Disposition: A | Discharge status: Home / Self Care | Payer: Self-pay | Source: Home / Self Care | Attending: Obstetrics & Gynecology

## 2021-02-21 DIAGNOSIS — Z833 Family history of diabetes mellitus: Secondary | ICD-10-CM | POA: Insufficient documentation

## 2021-02-21 DIAGNOSIS — N946 Dysmenorrhea, unspecified: Secondary | ICD-10-CM | POA: Diagnosis not present

## 2021-02-21 DIAGNOSIS — Q513 Bicornate uterus: Secondary | ICD-10-CM | POA: Insufficient documentation

## 2021-02-21 DIAGNOSIS — F17210 Nicotine dependence, cigarettes, uncomplicated: Secondary | ICD-10-CM | POA: Diagnosis not present

## 2021-02-21 DIAGNOSIS — N920 Excessive and frequent menstruation with regular cycle: Secondary | ICD-10-CM | POA: Diagnosis present

## 2021-02-21 DIAGNOSIS — Z8349 Family history of other endocrine, nutritional and metabolic diseases: Secondary | ICD-10-CM | POA: Insufficient documentation

## 2021-02-21 DIAGNOSIS — Z8249 Family history of ischemic heart disease and other diseases of the circulatory system: Secondary | ICD-10-CM | POA: Diagnosis not present

## 2021-02-21 HISTORY — PX: DILATATION AND CURETTAGE/HYSTEROSCOPY WITH MINERVA: SHX6851

## 2021-02-21 LAB — ABO/RH: ABO/RH(D): A NEG

## 2021-02-21 SURGERY — DILATATION AND CURETTAGE/HYSTEROSCOPY WITH MINERVA
Anesthesia: General | Site: Vagina

## 2021-02-21 MED ORDER — MIDAZOLAM HCL 2 MG/2ML IJ SOLN
INTRAMUSCULAR | Status: AC
  Filled 2021-02-21: qty 2

## 2021-02-21 MED ORDER — ONDANSETRON HCL 4 MG/2ML IJ SOLN
INTRAMUSCULAR | Status: DC | PRN
  Administered 2021-02-21: 4 mg via INTRAVENOUS

## 2021-02-21 MED ORDER — PROPOFOL 10 MG/ML IV BOLUS
INTRAVENOUS | Status: DC | PRN
  Administered 2021-02-21: 25 ug/kg/min via INTRAVENOUS
  Administered 2021-02-21: 200 mg via INTRAVENOUS

## 2021-02-21 MED ORDER — ONDANSETRON HCL 4 MG/2ML IJ SOLN: 4.0000 mg | Freq: Once | INTRAMUSCULAR | Status: DC | PRN

## 2021-02-21 MED ORDER — DEXAMETHASONE SODIUM PHOSPHATE 4 MG/ML IJ SOLN
INTRAMUSCULAR | Status: DC | PRN
  Administered 2021-02-21: 5 mg via INTRAVENOUS

## 2021-02-21 MED ORDER — ONDANSETRON HCL 4 MG/2ML IJ SOLN
INTRAMUSCULAR | Status: AC
  Filled 2021-02-21: qty 2

## 2021-02-21 MED ORDER — HYDROCODONE-ACETAMINOPHEN 5-325 MG PO TABS: 1.0000 | ORAL_TABLET | Freq: Four times a day (QID) | ORAL | 0 refills | Status: DC | PRN

## 2021-02-21 MED ORDER — LACTATED RINGERS IV SOLN: INTRAVENOUS | Status: DC

## 2021-02-21 MED ORDER — ORAL CARE MOUTH RINSE: 15.0000 mL | Freq: Once | OROMUCOSAL | Status: AC

## 2021-02-21 MED ORDER — FENTANYL CITRATE (PF) 100 MCG/2ML IJ SOLN
INTRAMUSCULAR | Status: AC
  Filled 2021-02-21: qty 2

## 2021-02-21 MED ORDER — ONDANSETRON 8 MG PO TBDP: 8.0000 mg | ORAL_TABLET | Freq: Three times a day (TID) | ORAL | 0 refills | Status: DC | PRN

## 2021-02-21 MED ORDER — DEXAMETHASONE SODIUM PHOSPHATE 10 MG/ML IJ SOLN
INTRAMUSCULAR | Status: AC
  Filled 2021-02-21: qty 1

## 2021-02-21 MED ORDER — 0.9 % SODIUM CHLORIDE (POUR BTL) OPTIME
TOPICAL | Status: DC | PRN
  Administered 2021-02-21: 1000 mL

## 2021-02-21 MED ORDER — FENTANYL CITRATE (PF) 100 MCG/2ML IJ SOLN
INTRAMUSCULAR | Status: DC | PRN
  Administered 2021-02-21 (×2): 50 ug via INTRAVENOUS

## 2021-02-21 MED ORDER — POVIDONE-IODINE 10 % EX SWAB: 2.0000 "application " | Freq: Once | CUTANEOUS | Status: DC

## 2021-02-21 MED ORDER — SODIUM CHLORIDE 0.9 % IR SOLN
Status: DC | PRN
  Administered 2021-02-21: 3000 mL

## 2021-02-21 MED ORDER — CHLORHEXIDINE GLUCONATE 0.12 % MT SOLN
15.0000 mL | Freq: Once | OROMUCOSAL | Status: AC
  Administered 2021-02-21: 15 mL via OROMUCOSAL
  Filled 2021-02-21: qty 15

## 2021-02-21 MED ORDER — HYDROMORPHONE HCL 1 MG/ML IJ SOLN
0.2500 mg | INTRAMUSCULAR | Status: DC | PRN
  Administered 2021-02-21 (×2): 0.5 mg via INTRAVENOUS
  Filled 2021-02-21 (×2): qty 0.5

## 2021-02-21 MED ORDER — LIDOCAINE HCL (PF) 2 % IJ SOLN
INTRAMUSCULAR | Status: AC
  Filled 2021-02-21: qty 5

## 2021-02-21 MED ORDER — KETOROLAC TROMETHAMINE 10 MG PO TABS: 10.0000 mg | ORAL_TABLET | Freq: Three times a day (TID) | ORAL | 0 refills | Status: DC | PRN

## 2021-02-21 MED ORDER — CEFAZOLIN SODIUM-DEXTROSE 2-4 GM/100ML-% IV SOLN
2.0000 g | INTRAVENOUS | Status: AC
  Administered 2021-02-21: 2 g via INTRAVENOUS
  Filled 2021-02-21: qty 100

## 2021-02-21 MED ORDER — MEPERIDINE HCL 50 MG/ML IJ SOLN: 6.2500 mg | INTRAMUSCULAR | Status: DC | PRN

## 2021-02-21 MED ORDER — MIDAZOLAM HCL 5 MG/5ML IJ SOLN
INTRAMUSCULAR | Status: DC | PRN
  Administered 2021-02-21: 2 mg via INTRAVENOUS

## 2021-02-21 MED ORDER — KETOROLAC TROMETHAMINE 30 MG/ML IJ SOLN
30.0000 mg | Freq: Once | INTRAMUSCULAR | Status: AC
  Administered 2021-02-21: 30 mg via INTRAVENOUS
  Filled 2021-02-21: qty 1

## 2021-02-21 SURGICAL SUPPLY — 29 items
BAG HAMPER (MISCELLANEOUS) ×3 IMPLANT
CLOTH BEACON ORANGE TIMEOUT ST (SAFETY) ×3 IMPLANT
COVER LIGHT HANDLE STERIS (MISCELLANEOUS) ×6 IMPLANT
COVER WAND RF STERILE (DRAPES) ×3 IMPLANT
GAUZE 4X4 16PLY RFD (DISPOSABLE) ×6 IMPLANT
GLOVE ECLIPSE 8.0 STRL XLNG CF (GLOVE) ×3 IMPLANT
GLOVE SRG 8 PF TXTR STRL LF DI (GLOVE) ×1 IMPLANT
GLOVE SURG UNDER POLY LF SZ7 (GLOVE) ×6 IMPLANT
GLOVE SURG UNDER POLY LF SZ8 (GLOVE) ×3
GOWN STRL REUS W/TWL LRG LVL3 (GOWN DISPOSABLE) ×3 IMPLANT
GOWN STRL REUS W/TWL XL LVL3 (GOWN DISPOSABLE) ×3 IMPLANT
HANDPIECE ABLA MINERVA ENDO (MISCELLANEOUS) ×3 IMPLANT
INST SET HYSTEROSCOPY (KITS) ×3 IMPLANT
IV NS 1000ML (IV SOLUTION) ×3
IV NS 1000ML BAXH (IV SOLUTION) ×1 IMPLANT
IV NS IRRIG 3000ML ARTHROMATIC (IV SOLUTION) ×3 IMPLANT
KIT TURNOVER CYSTO (KITS) ×3 IMPLANT
MANIFOLD NEPTUNE II (INSTRUMENTS) ×3 IMPLANT
MARKER SKIN DUAL TIP RULER LAB (MISCELLANEOUS) ×3 IMPLANT
NS IRRIG 1000ML POUR BTL (IV SOLUTION) ×3 IMPLANT
PACK BASIC III (CUSTOM PROCEDURE TRAY) ×3
PACK SRG BSC III STRL LF ECLPS (CUSTOM PROCEDURE TRAY) ×1 IMPLANT
PAD ARMBOARD 7.5X6 YLW CONV (MISCELLANEOUS) ×3 IMPLANT
PAD TELFA 3X4 1S STER (GAUZE/BANDAGES/DRESSINGS) ×3 IMPLANT
SET BASIN LINEN APH (SET/KITS/TRAYS/PACK) ×3 IMPLANT
SET CYSTO W/LG BORE CLAMP LF (SET/KITS/TRAYS/PACK) ×3 IMPLANT
SHEET LAVH (DRAPES) ×3 IMPLANT
TUBE CONNECTING 12'X1/4 (SUCTIONS) ×1
TUBE CONNECTING 12X1/4 (SUCTIONS) ×2 IMPLANT

## 2021-02-21 NOTE — Anesthesia Preprocedure Evaluation (Signed)
Anesthesia Evaluation  Patient identified by MRN, date of birth, ID band Patient awake    Reviewed: Allergy & Precautions, NPO status , Patient's Chart, lab work & pertinent test results, reviewed documented beta blocker date and time   History of Anesthesia Complications Negative for: history of anesthetic complications  Airway Mallampati: II  TM Distance: >3 FB Neck ROM: Full    Dental  (+) Dental Advisory Given   Pulmonary Current SmokerPatient did not abstain from smoking.,    Pulmonary exam normal breath sounds clear to auscultation       Cardiovascular Exercise Tolerance: Good hypertension, Pt. on medications and Pt. on home beta blockers Normal cardiovascular exam Rhythm:Regular Rate:Normal  19-Feb-2021 09:18:29 [facility]-300 ROUTINE RECORD 01/27/1971 (50 yr) Female [place]: Loc:[id number] Technician: Test ind: Vent. rate 84 BPM PR interval 148 ms QRS duration 76 ms QT/QTcB 360/425 ms P-R-T axes 38 -16 53 Normal sinus rhythm Low voltage QRS Borderline ECG   Neuro/Psych PSYCHIATRIC DISORDERS Anxiety Depression    GI/Hepatic GERD  Medicated and Controlled,(+)     substance abuse  marijuana use,   Endo/Other  negative endocrine ROS  Renal/GU negative Renal ROS     Musculoskeletal  (+) Arthritis ,   Abdominal   Peds  Hematology negative hematology ROS (+)   Anesthesia Other Findings   Reproductive/Obstetrics                            Anesthesia Physical Anesthesia Plan  ASA: II  Anesthesia Plan: General   Post-op Pain Management:    Induction: Intravenous  PONV Risk Score and Plan: 4 or greater and Ondansetron, Dexamethasone and Midazolam  Airway Management Planned: LMA  Additional Equipment:   Intra-op Plan:   Post-operative Plan: Extubation in OR  Informed Consent: I have reviewed the patients History and Physical, chart, labs and  discussed the procedure including the risks, benefits and alternatives for the proposed anesthesia with the patient or authorized representative who has indicated his/her understanding and acceptance.     Dental advisory given  Plan Discussed with: CRNA and Surgeon  Anesthesia Plan Comments:         Anesthesia Quick Evaluation

## 2021-02-21 NOTE — Anesthesia Procedure Notes (Signed)
Procedure Name: LMA Insertion Performed by: Keatin Benham L, CRNA Pre-anesthesia Checklist: Patient identified, Emergency Drugs available, Suction available, Patient being monitored and Timeout performed Patient Re-evaluated:Patient Re-evaluated prior to induction Oxygen Delivery Method: Circle system utilized Preoxygenation: Pre-oxygenation with 100% oxygen Induction Type: IV induction LMA: LMA inserted LMA Size: 4.0 Number of attempts: 1 Placement Confirmation: positive ETCO2,  CO2 detector and breath sounds checked- equal and bilateral Tube secured with: Tape Dental Injury: Teeth and Oropharynx as per pre-operative assessment        

## 2021-02-21 NOTE — H&P (Signed)
Preoperative History and Physical  Tiffany Morgan is a [age] y.o. [phone] with No LMP recorded. (Menstrual status: Perimenopausal). admitted for a hysteroscopy uterine curettage Minerva endometrial ablation for menorrhagia dysmenorrhea.    PMH:    Past Medical History:  Diagnosis Date  . Anxiety   . Arthritis    spine  . Bicornate uterus   . Chronic back pain    started about 6 years ago - four bulging discs  . Depression   . Essential hypertension 08/22/2017    PSH:     Past Surgical History:  Procedure Laterality Date  . BREAST BIOPSY Left 01/31/2016   benign  . BREAST BIOPSY Left 08/17/2013   benign  . CESAREAN SECTION     3  . TUBAL LIGATION      POb/GynH:      OB History    Gravida  5   Para  2   Term  2   Preterm      AB  3   Living  2     SAB      IAB  3   Ectopic      Multiple      Live Births  2        Obstetric Comments  Cesarean section x 2, due to bicornuate uterus and fetal malpresntaton BREECH, THEN REPEAT        SH:   Social History   Tobacco Use  . Smoking status: Current Every Day Smoker    Packs/day: 0.50    Years: 23.00    Pack years: 11.50    Types: Cigarettes    Start date: 09/17/1995  . Smokeless tobacco: Never Used  . Tobacco comment: trying to quit  Vaping Use  . Vaping Use: Never used  Substance Use Topics  . Alcohol use: No  . Drug use: Yes    Types: Marijuana    Comment: 2 joints per week    FH:    Family History  Problem Relation Age of Onset  . Hypertension Mother   . Depression Mother   . Hyperlipidemia Mother   . Heart disease Mother   . Heart attack Mother   . Cancer Father        pancreatic  . Mental illness Sister        schizoid  . Diabetes Sister   . Schizophrenia Sister   . Cancer Maternal Grandmother        myleodysplastic syndrome  . Depression Maternal Grandmother   . Myelodysplastic syndrome Maternal Grandmother   . Diabetes Maternal Grandmother   . Hypertension Maternal  Grandmother   . Alcohol abuse Maternal Grandfather      Allergies: No Known Allergies  Medications:       Current Facility-Administered Medications:  .  0.9 % irrigation (POUR BTL), , , PRN, Lazaro ArmsEure, Ariely Riddell H, MD, 1,000 mL at 02/21/21 1230 .  ceFAZolin (ANCEF) IVPB 2g/100 mL premix, 2 g, Intravenous, On Call to OR, Lazaro ArmsEure, Vue Pavon H, MD .  lactated ringers infusion, , Intravenous, Continuous, Battula, Rajamani C, MD, Last Rate: 50 mL/hr at 02/21/21 1141, New Bag at 02/21/21 1141 .  povidone-iodine 10 % swab 2 application, 2 application, Topical, Once, Eilah Common, Amaryllis DykeLuther H, MD .  sodium chloride irrigation 0.9 %, , , PRN, Lazaro ArmsEure, Melania Kirks H, MD, 3,000 mL at 02/21/21 1230  Review of Systems:   Review of Systems  Constitutional: Negative for fever, chills, weight loss, malaise/fatigue and diaphoresis.  HENT: Negative for hearing loss,  ear pain, nosebleeds, congestion, sore throat, neck pain, tinnitus and ear discharge.   Eyes: Negative for blurred vision, double vision, photophobia, pain, discharge and redness.  Respiratory: Negative for cough, hemoptysis, sputum production, shortness of breath, wheezing and stridor.   Cardiovascular: Negative for chest pain, palpitations, orthopnea, claudication, leg swelling and PND.  Gastrointestinal: Positive for abdominal pain. Negative for heartburn, nausea, vomiting, diarrhea, constipation, blood in stool and melena.  Genitourinary: Negative for dysuria, urgency, frequency, hematuria and flank pain.  Musculoskeletal: Negative for myalgias, back pain, joint pain and falls.  Skin: Negative for itching and rash.  Neurological: Negative for dizziness, tingling, tremors, sensory change, speech change, focal weakness, seizures, loss of consciousness, weakness and headaches.  Endo/Heme/Allergies: Negative for environmental allergies and polydipsia. Does not bruise/bleed easily.  Psychiatric/Behavioral: Negative for depression, suicidal ideas, hallucinations, memory loss  and substance abuse. The patient is not nervous/anxious and does not have insomnia.      PHYSICAL EXAM:  Blood pressure 120/70, pulse 79, temperature 98.8 F (37.1 C), temperature source Oral, resp. rate 16, height 5\' 7"  (1.702 m), weight 99.3 kg, SpO2 100 %.    Vitals reviewed. Constitutional: She is oriented to person, place, and time. She appears well-developed and well-nourished.  HENT:  Head: Normocephalic and atraumatic.  Right Ear: External ear normal.  Left Ear: External ear normal.  Nose: Nose normal.  Mouth/Throat: Oropharynx is clear and moist.  Eyes: Conjunctivae and EOM are normal. Pupils are equal, round, and reactive to light. Right eye exhibits no discharge. Left eye exhibits no discharge. No scleral icterus.  Neck: Normal range of motion. Neck supple. No tracheal deviation present. No thyromegaly present.  Cardiovascular: Normal rate, regular rhythm, normal heart sounds and intact distal pulses.  Exam reveals no gallop and no friction rub.   No murmur heard. Respiratory: Effort normal and breath sounds normal. No respiratory distress. She has no wheezes. She has no rales. She exhibits no tenderness.  GI: Soft. Bowel sounds are normal. She exhibits no distension and no mass. There is tenderness. There is no rebound and no guarding.  Genitourinary:       Vulva is normal without lesions Vagina is pink moist without discharge Cervix normal in appearance and pap is normal Uterus is normal size, contour, position, consistency, mobility, non-tender Adnexa is negative with normal sized ovaries by sonogram  Musculoskeletal: Normal range of motion. She exhibits no edema and no tenderness.  Neurological: She is alert and oriented to person, place, and time. She has normal reflexes. She displays normal reflexes. No cranial nerve deficit. She exhibits normal muscle tone. Coordination normal.  Skin: Skin is warm and dry. No rash noted. No erythema. No pallor.  Psychiatric: She has  a normal mood and affect. Her behavior is normal. Judgment and thought content normal.    Labs: Results for orders placed or performed during the hospital encounter of 02/19/21 (from the past 336 hour(s))  Rapid urine drug screen (hospital performed)   Collection Time: 02/19/21  9:43 AM  Result Value Ref Range   Opiates NONE DETECTED NONE DETECTED   Cocaine NONE DETECTED NONE DETECTED   Benzodiazepines NONE DETECTED NONE DETECTED   Amphetamines NONE DETECTED NONE DETECTED   Tetrahydrocannabinol POSITIVE (A) NONE DETECTED   Barbiturates NONE DETECTED NONE DETECTED  Rapid HIV screen (HIV 1/2 Ab+Ag)   Collection Time: 02/19/21  9:43 AM  Result Value Ref Range   HIV-1 P24 Antigen - HIV24 NON REACTIVE NON REACTIVE   HIV 1/2 Antibodies NON REACTIVE  NON REACTIVE   Interpretation (HIV Ag Ab)      A non reactive test result means that HIV 1 or HIV 2 antibodies and HIV 1 p24 antigen were not detected in the specimen.  Urinalysis, Routine w reflex microscopic Urine, Clean Catch   Collection Time: 02/19/21  9:43 AM  Result Value Ref Range   Color, Urine YELLOW YELLOW   APPearance HAZY (A) CLEAR   Specific Gravity, Urine 1.008 1.005 - 1.030   pH 6.0 5.0 - 8.0   Glucose, UA NEGATIVE NEGATIVE mg/dL   Hgb urine dipstick SMALL (A) NEGATIVE   Bilirubin Urine NEGATIVE NEGATIVE   Ketones, ur NEGATIVE NEGATIVE mg/dL   Protein, ur NEGATIVE NEGATIVE mg/dL   Nitrite NEGATIVE NEGATIVE   Leukocytes,Ua NEGATIVE NEGATIVE   RBC / HPF 0-5 0 - 5 RBC/hpf   WBC, UA 0-5 0 - 5 WBC/hpf   Bacteria, UA RARE (A) NONE SEEN   Squamous Epithelial / LPF 0-5 0 - 5   Mucus PRESENT   CBC   Collection Time: 02/19/21  9:44 AM  Result Value Ref Range   WBC 12.5 (H) 4.0 - 10.5 K/uL   RBC 4.60 3.87 - 5.11 MIL/uL   Hemoglobin 14.0 12.0 - 15.0 g/dL   HCT 29.9 37.1 - 69.6 %   MCV 90.2 80.0 - 100.0 fL   MCH 30.4 26.0 - 34.0 pg   MCHC 33.7 30.0 - 36.0 g/dL   RDW 78.9 38.1 - 01.7 %   Platelets 359 150 - 400 K/uL   nRBC  0.0 0.0 - 0.2 %  Comprehensive metabolic panel   Collection Time: 02/19/21  9:44 AM  Result Value Ref Range   Sodium 140 135 - 145 mmol/L   Potassium 3.8 3.5 - 5.1 mmol/L   Chloride 109 98 - 111 mmol/L   CO2 24 22 - 32 mmol/L   Glucose, Bld 131 (H) 70 - 99 mg/dL   BUN 7 6 - 20 mg/dL   Creatinine, Ser 5.10 0.44 - 1.00 mg/dL   Calcium 9.5 8.9 - 25.8 mg/dL   Total Protein 7.6 6.5 - 8.1 g/dL   Albumin 4.0 3.5 - 5.0 g/dL   AST 11 (L) 15 - 41 U/L   ALT 13 0 - 44 U/L   Alkaline Phosphatase 109 38 - 126 U/L   Total Bilirubin 0.4 0.3 - 1.2 mg/dL   GFR, Estimated >52 >77 mL/min   Anion gap 7 5 - 15  hCG, quantitative, pregnancy   Collection Time: 02/19/21  9:44 AM  Result Value Ref Range   hCG, Beta Chain, Quant, S <1 <5 mIU/mL  Type and screen   Collection Time: 02/19/21  9:44 AM  Result Value Ref Range   ABO/RH(D) A NEG    Antibody Screen NEG    Sample Expiration 03/05/2021,2359    Extend sample reason      NO TRANSFUSIONS OR PREGNANCY IN THE PAST 3 MONTHS Performed at [facility], [address], [place], [place] [address]   ABO/Rh   Collection Time: 02/21/21  5:00 AM  Result Value Ref Range   ABO/RH(D)      A NEG Performed at [facility], [address] [place], [place] [address]     EKG: Orders placed or performed during the hospital encounter of 02/19/21  . EKG 12-Lead  . EKG 12-Lead    Imaging Studies: No results found.    Assessment: Menorrhagia, regular cycle Dysmenorrhea No response to conservative hormonal methods  Plan: Hysteroscopy uterine  curettage Minerva endometrial ablation  Lazaro Arms 02/21/2021 12:32 PM

## 2021-02-21 NOTE — Anesthesia Postprocedure Evaluation (Signed)
Anesthesia Post Note  Patient: Tiffany Morgan  Procedure(s) Performed: DILATATION AND CURETTAGE/HYSTEROSCOPY WITH MINERVA (N/A Vagina )  Patient location during evaluation: PACU Anesthesia Type: General Level of consciousness: awake and alert and oriented Pain management: pain level controlled Vital Signs Assessment: post-procedure vital signs reviewed and stable Respiratory status: spontaneous breathing and respiratory function stable Cardiovascular status: blood pressure returned to baseline and stable Postop Assessment: no apparent nausea or vomiting Anesthetic complications: no   No complications documented.   Last Vitals:  Vitals:   02/21/21 1330 02/21/21 1345  BP: 123/88 128/78  Pulse: 85 81  Resp: 17 18  Temp:    SpO2: 100% 96%    Last Pain:  Vitals:   02/21/21 1336  TempSrc:   PainSc: 6                  Marisel Tostenson C Amore Grater

## 2021-02-21 NOTE — Op Note (Signed)
Preoperative diagnosis:  1.   Menorrhagia, regula cycle                                         2.  Dysmenorrhea                                          3.  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 She indeed has a bicornuate uterus, rudimentary horn is on the right There were no fibroid or other abnormalities.  Description of operation: The patient was taken to the operating room and placed in the supine position. She underwent general anesthesia using the laryngeal mask airway. She was placed in the dorsal lithotomy position and prepped and draped in the usual sterile fashion. A Graves speculum was placed and the anterior cervical lip was grasped with a single-tooth tenaculum. The cervix was dilated serially to allow passage of the hysteroscope. Diagnostic hysteroscopy was performed and was found to be normal. She is known to have a bicornuate uterus with seemingly normal left horn cavity and right horn is quite small and rudimentary  I then proceeded to perform the Minerva endometrial ablation. I ablated the left uterine horn solely.  The right is quite small The uterus sounded to 9 cm The handpiece was attached to the Minerva power source/machine and the handpiece passed the checklist. The array was squeezed down to remove all of the air present.  The array was then place into the endometrial cavity and deployed to a length of 6 cm. The handpiece confirmed appropriate width by being in the green portion of the visual dial. The cervical cuff was then inflated to the point the CO2 indicator was in the green. The endometrial integrity check was then performed and integrity sequence was confirmed x 2. The heating was then begun and carried out for a total of 2 minutes(which is standard  therapy time). When the plasma cycle was finished,  the cervical cuff was deflated and the array was removed with tissue present on the silicon membrane. There was appropriate post Minerva bleeding and uterine discharge.     All of the equipment worked well throughout the procedure.  The patient was awakened from anesthesia and taken to the recovery room in good stable condition all counts were correct. She received 2 g of Ancef and 30 mg of Toradol preoperatively. She will be discharged from the recovery room and followed up in the office in 1- 2 weeks.   She can expect 4 weeks of post procedure bloody watery discharge  Lazaro Arms, MD  02/21/2021 1:09 PM

## 2021-02-21 NOTE — Discharge Instructions (Signed)
Endometrial Ablation Endometrial ablation is a procedure that destroys the thin inner layer of the lining of the uterus (endometrium). This procedure may be done:  To stop heavy menstrual periods.  To stop bleeding that is causing anemia.  To control irregular bleeding.  To treat bleeding caused by small tumors (fibroids) in the endometrium. This procedure is often done as an alternative to major surgery, such as removal of the uterus and cervix (hysterectomy). As a result of this procedure:  You may not be able to have children. However, if you have not yet gone through menopause: ? You may still have a small chance of getting pregnant. ? You will need to use a reliable method of birth control after the procedure to prevent pregnancy.  You may stop having a menstrual period, or you may have only a small amount of bleeding during your period. Menstruation may return several years after the procedure. Tell a health care provider about:  Any allergies you have.  All medicines you are taking, including vitamins, herbs, eye drops, creams, and over-the-counter medicines.  Any problems you or family members have had with the use of anesthetic medicines.  Any blood disorders you have.  Any surgeries you have had.  Any medical conditions you have.  Whether you are pregnant or may be pregnant. What are the risks? Generally, this is a safe procedure. However, problems may occur, including:  A hole (perforation) in the uterus or bowel.  Infection in the uterus, bladder, or vagina.  Bleeding.  Allergic reaction to medicines.  Damage to nearby structures or organs.  An air bubble in the lung (air embolus).  Problems with pregnancy.  Failure of the procedure.  Decreased ability to diagnose cancer in the endometrium. Scar tissue forms after the procedure, making it more difficult to get a sample of the uterine lining. What happens before the procedure? Medicines Ask your health  care provider about:  Changing or stopping your regular medicines. This is especially important if you take diabetes medicines or blood thinners.  Taking medicines such as aspirin and ibuprofen. These medicines can thin your blood. Do not take these medicines before your procedure if your doctor tells you not to take them.  Taking over-the-counter medicines, vitamins, herbs, and supplements. Tests  You will have tests of your endometrium to make sure there are no precancerous cells or cancer cells present.  You may have an ultrasound of the uterus. General instructions  Do not use any products that contain nicotine or tobacco for at least 4 weeks before the procedure. These include cigarettes, chewing tobacco, and vaping devices, such as e-cigarettes. If you need help quitting, ask your health care provider.  You may be given medicines to thin the endometrium.  Ask your health care provider what steps will be taken to help prevent infection. These steps may include: ? Removing hair at the surgery site. ? Washing skin with a germ-killing soap. ? Taking antibiotic medicine.  Plan to have a responsible adult take you home from the hospital or clinic.  Plan to have a responsible adult care for you for the time you are told after you leave the hospital or clinic. This is important. What happens during the procedure?  You will lie on an exam table with your feet and legs supported as in a pelvic exam.  An IV will be inserted into one of your veins.  You will be given a medicine to help you relax (sedative).  A surgical tool with   a light and camera (resectoscope) will be inserted into your vagina and moved into your uterus. This allows your surgeon to see inside your uterus.  Endometrial tissue will be destroyed and removed, using one of the following methods: ? Radiofrequency. This uses an electrical current to destroy the endometrium. ? Cryotherapy. This uses extreme cold to freeze  the endometrium. ? Heated fluid. This uses a heated salt and water (saline) solution to destroy the endometrium. ? Microwave. This uses high-energy microwaves to heat up the endometrium and destroy it. ? Thermal balloon. This involves inserting a catheter with a balloon tip into the uterus. The balloon tip is filled with heated fluid to destroy the endometrium. The procedure may vary among health care providers and hospitals.   What happens after the procedure?  Your blood pressure, heart rate, breathing rate, and blood oxygen level will be monitored until you leave the hospital or clinic.  You may have vaginal bleeding for 4-6 weeks after the procedure. You may also have: ? Cramps. ? A thin, watery vaginal discharge that is light pink or brown. ? A need to urinate more than usual. ? Nausea.  If you were given a sedative during the procedure, it can affect you for several hours. Do not drive or operate machinery until your health care provider says that it is safe.  Do not have sex or insert anything into your vagina until your health care provider says it is safe. Summary  Endometrial ablation is done to treat many causes of heavy menstrual bleeding. The procedure destroys the thin inner layer of the lining of the uterus (endometrium).  This procedure is often done as an alternative to major surgery, such as removal of the uterus and cervix (hysterectomy).  Plan to have a responsible adult take you home from the hospital or clinic. This information is not intended to replace advice given to you by your health care provider. Make sure you discuss any questions you have with your health care provider. Document Revised: 03/23/2020 Document Reviewed: 03/23/2020 Elsevier Patient Education  [place] Anesthesia, Adult, Care After This sheet gives you information about how to care for yourself after your procedure. Your health care provider may also give you more  specific instructions. If you have problems or questions, contact your health care provider. What can I expect after the procedure? After the procedure, the following side effects are common:  Pain or discomfort at the IV site.  Nausea.  Vomiting.  Sore throat.  Trouble concentrating.  Feeling cold or chills.  Feeling weak or tired.  Sleepiness and fatigue.  Soreness and body aches. These side effects can affect parts of the body that were not involved in surgery. Follow these instructions at home: For the time period you were told by your health care provider:  Rest.  Do not participate in activities where you could fall or become injured.  Do not drive or use machinery.  Do not drink alcohol.  Do not take sleeping pills or medicines that cause drowsiness.  Do not make important decisions or sign legal documents.  Do not take care of children on your own.   Eating and drinking  Follow any instructions from your health care provider about eating or drinking restrictions.  When you feel hungry, start by eating small amounts of foods that are soft and easy to digest (bland), such as toast. Gradually return to your regular diet.  Drink enough fluid to keep your  urine pale yellow.  If you vomit, rehydrate by drinking water, juice, or clear broth. General instructions  If you have sleep apnea, surgery and certain medicines can increase your risk for breathing problems. Follow instructions from your health care provider about wearing your sleep device: ? Anytime you are sleeping, including during daytime naps. ? While taking prescription pain medicines, sleeping medicines, or medicines that make you drowsy.  Have a responsible adult stay with you for the time you are told. It is important to have someone help care for you until you are awake and alert.  Return to your normal activities as told by your health care provider. Ask your health care provider what activities  are safe for you.  Take over-the-counter and prescription medicines only as told by your health care provider.  If you smoke, do not smoke without supervision.  Keep all follow-up visits as told by your health care provider. This is important. Contact a health care provider if:  You have nausea or vomiting that does not get better with medicine.  You cannot eat or drink without vomiting.  You have pain that does not get better with medicine.  You are unable to pass urine.  You develop a skin rash.  You have a fever.  You have redness around your IV site that gets worse. Get help right away if:  You have difficulty breathing.  You have chest pain.  You have blood in your urine or stool, or you vomit blood. Summary  After the procedure, it is common to have a sore throat or nausea. It is also common to feel tired.  Have a responsible adult stay with you for the time you are told. It is important to have someone help care for you until you are awake and alert.  When you feel hungry, start by eating small amounts of foods that are soft and easy to digest (bland), such as toast. Gradually return to your regular diet.  Drink enough fluid to keep your urine pale yellow.  Return to your normal activities as told by your health care provider. Ask your health care provider what activities are safe for you. This information is not intended to replace advice given to you by your health care provider. Make sure you discuss any questions you have with your health care provider. Document Revised: 05/18/2020 Document Reviewed: 12/16/2019 Elsevier Patient Education  2021 Elsevier Inc.  

## 2021-02-21 NOTE — Transfer of Care (Signed)
Immediate Anesthesia Transfer of Care Note  Patient: Tiffany Morgan  Procedure(s) Performed: DILATATION AND CURETTAGE/HYSTEROSCOPY WITH MINERVA (N/A Vagina )  Patient Location: PACU  Anesthesia Type:General  Level of Consciousness: drowsy  Airway & Oxygen Therapy: Patient Spontanous Breathing and Patient connected to face mask oxygen  Post-op Assessment: Report given to RN and Post -op Vital signs reviewed and stable  Post vital signs: Reviewed and stable  Last Vitals:  Vitals Value Taken Time  BP 185/97 02/21/21 1318  Temp    Pulse 94 02/21/21 1319  Resp 25 02/21/21 1319  SpO2 97 % 02/21/21 1319  Vitals shown include unvalidated device data.  Last Pain:  Vitals:   02/21/21 1138  TempSrc: Oral  PainSc: 0-No pain         Complications: No complications documented.

## 2021-02-22 ENCOUNTER — Telehealth: Payer: Self-pay

## 2021-02-22 ENCOUNTER — Encounter (HOSPITAL_COMMUNITY): Payer: Self-pay | Admitting: Obstetrics & Gynecology

## 2021-02-22 NOTE — Telephone Encounter (Cosign Needed)
Yes the zolpidem

## 2021-02-22 NOTE — Telephone Encounter (Signed)
pt called left a message that the sleep medication is not working for her can something else be sent in.

## 2021-02-22 NOTE — Telephone Encounter (Signed)
received a fax requesting a prior auth to be done on the zolpidem tartrate 5mg 

## 2021-02-22 NOTE — Telephone Encounter (Signed)
Are you talking about Zolpidem ? Did she already receive it or are we waiting for PA ? Please let me know.

## 2021-02-22 NOTE — Telephone Encounter (Signed)
went online to [email or web address] and submitted the prior auth . - pending 

## 2021-02-23 NOTE — Telephone Encounter (Signed)
received email that [email or web address] covered the zolpidem

## 2021-02-25 NOTE — Telephone Encounter (Signed)
Please contact the patient and clarify- if Ambien does not work for her for sleep, she can try [place] if interested.

## 2021-02-27 ENCOUNTER — Other Ambulatory Visit: Payer: Self-pay | Admitting: Psychiatry

## 2021-02-27 ENCOUNTER — Telehealth: Payer: Self-pay

## 2021-02-27 MED ORDER — ESZOPICLONE 1 MG PO TABS: 1.0000 mg | ORAL_TABLET | Freq: Every evening | ORAL | 0 refills | Status: DC | PRN

## 2021-02-27 NOTE — Telephone Encounter (Signed)
submitted the prior auth online to [email or web address] - pending

## 2021-02-27 NOTE — Telephone Encounter (Signed)
Medication sent to the pharmacy. Please advise her to try Lunesta 1 mg at night as needed for sleep. Side effects including drowsiness. Advise her to discontinue Ambien.

## 2021-02-27 NOTE — Telephone Encounter (Signed)
Called and left a message for pt to call office back.

## 2021-02-27 NOTE — Telephone Encounter (Signed)
pt sad she will try anything to get some sleep

## 2021-02-27 NOTE — Telephone Encounter (Signed)
received fax that a prior auth was needed for the eszopiclone 1mg  

## 2021-02-28 NOTE — Telephone Encounter (Signed)
Could you update her about this- and ask if she would like to try Belsomra instead. Side effects including drowsiness.

## 2021-02-28 NOTE — Telephone Encounter (Signed)
the prior auth was denied fro the eszopiclone 1mg 

## 2021-03-01 ENCOUNTER — Other Ambulatory Visit: Payer: Self-pay | Admitting: Psychiatry

## 2021-03-01 MED ORDER — TRAZODONE HCL 150 MG PO TABS: 150.0000 mg | ORAL_TABLET | Freq: Every evening | ORAL | 0 refills | Status: DC | PRN

## 2021-03-01 NOTE — Telephone Encounter (Signed)
pt called back she states that she wanted to know if you would put her back o the trazodone just a highter mg?

## 2021-03-01 NOTE — Telephone Encounter (Signed)
Ordered Trazodone 150 mg at night as needed for insomnia.

## 2021-03-02 ENCOUNTER — Encounter: Payer: Self-pay | Admitting: Obstetrics & Gynecology

## 2021-03-02 ENCOUNTER — Encounter: Payer: Self-pay | Admitting: *Deleted

## 2021-03-02 ENCOUNTER — Telehealth (INDEPENDENT_AMBULATORY_CARE_PROVIDER_SITE_OTHER): Payer: Medicaid Other | Admitting: Obstetrics & Gynecology

## 2021-03-02 DIAGNOSIS — Z9889 Other specified postprocedural states: Secondary | ICD-10-CM

## 2021-03-02 NOTE — Progress Notes (Signed)
Video Mychart visit Post op: Patient is at home I am in my office Total time 10 minutes   HPI: Patient returns for routine postoperative follow-up having undergone hysteroscopy uterine curettage Minerva endometrial ablation on 02/21/21.  The patient's immediate postoperative recovery has been unremarkable. Since hospital discharge the patient reports no problems watery pink tinged discharge.   Current Outpatient Medications: [START ON 03/10/2021] busPIRone (BUSPAR) 10 MG tablet, Take 2 tablets (20 mg total) by mouth 3 (three) times daily., Disp: 180 tablet, Rfl: 5 clonazePAM (KLONOPIN) 0.5 MG tablet, Take 1 tablet (0.5 mg total) by mouth 2 (two) times daily as needed for anxiety., Disp: 60 tablet, Rfl: 2 gabapentin (NEURONTIN) 300 MG capsule, Take 1 capsule (300 mg total) by mouth 3 (three) times daily., Disp: 90 capsule, Rfl: 0 hydrOXYzine (VISTARIL) 50 MG capsule, Take 100 mg by mouth 2 (two) times daily as needed for anxiety., Disp: , Rfl:  metoprolol tartrate (LOPRESSOR) 25 MG tablet, Take 25 mg by mouth 2 (two) times daily. , Disp: , Rfl:  nortriptyline (PAMELOR) 25 MG capsule, Take 1 capsule (25 mg total) by mouth at bedtime., Disp: 30 capsule, Rfl: 1 ondansetron (ZOFRAN ODT) 8 MG disintegrating tablet, Take 1 tablet (8 mg total) by mouth every 8 (eight) hours as needed for nausea or vomiting., Disp: 8 tablet, Rfl: 0 pantoprazole (PROTONIX) 40 MG tablet, Take 40 mg by mouth daily., Disp: , Rfl:  rosuvastatin (CRESTOR) 10 MG tablet, Take 10 mg by mouth daily., Disp: , Rfl:  SUMAtriptan (IMITREX) 50 MG tablet, Take 50 mg by mouth 2 (two) times daily as needed for migraine., Disp: , Rfl:  tiZANidine (ZANAFLEX) 4 MG tablet, Take 4 mg by mouth in the morning and at bedtime., Disp: , Rfl:  traZODone (DESYREL) 150 MG tablet, Take 1 tablet (150 mg total) by mouth at bedtime as needed for sleep., Disp: 30 tablet, Rfl: 0 venlafaxine XR (EFFEXOR XR) 75 MG 24 hr capsule, 225 mg daily (150 mg + 75 mg)  (Patient taking differently: Take 75 mg by mouth daily with breakfast. 225 mg daily (150 mg + 75 mg)), Disp: 30 capsule, Rfl: 2 venlafaxine XR (EFFEXOR-XR) 150 MG 24 hr capsule, 225 mg daily (150 mg + 75 mg) (Patient taking differently: Take 150 mg by mouth daily with breakfast. 225 mg daily (150 mg + 75 mg)), Disp: 30 capsule, Rfl: 2 Vitamin D, Ergocalciferol, (DRISDOL) 1.25 MG (50000 UT) CAPS capsule, Take 50,000 Units by mouth every 7 (seven) days., Disp: , Rfl:  eszopiclone (LUNESTA) 1 MG TABS tablet, Take 1 tablet (1 mg total) by mouth at bedtime as needed for sleep. Take immediately before bedtime (Patient not taking: Reported on 03/02/2021), Disp: 30 tablet, Rfl: 0 HYDROcodone-acetaminophen (NORCO/VICODIN) 5-325 MG tablet, Take 1 tablet by mouth every 6 (six) hours as needed. (Patient not taking: Reported on 03/02/2021), Disp: 10 tablet, Rfl: 0 IBU 600 MG tablet, Take 600 mg by mouth every 6 (six) hours as needed for moderate pain. (Patient not taking: Reported on 03/02/2021), Disp: , Rfl:  ketorolac (TORADOL) 10 MG tablet, Take 1 tablet (10 mg total) by mouth every 8 (eight) hours as needed. (Patient not taking: Reported on 03/02/2021), Disp: 15 tablet, Rfl: 0 oxybutynin (DITROPAN) 5 MG tablet, Take 1 tablet (5 mg total) by mouth 3 (three) times daily. (Patient taking differently: Take 5 mg by mouth daily as needed for bladder spasms.), Disp: 90 tablet, Rfl: 3 zolpidem (AMBIEN) 5 MG tablet, Take 1 tablet (5 mg total) by mouth at bedtime  as needed for sleep. (Patient not taking: Reported on 03/02/2021), Disp: 30 tablet, Rfl: 1  No current facility-administered medications for this visit.    There were no vitals taken for this visit.  Physical Exam: N/a video visit Gen [facility] nAD  Diagnostic Tests:   Pathology: benign  Impression:   ICD-10-CM   1. S/P endometrial ablation  Z98.890         Plan: No orders of the defined types were placed in this encounter.    Follow up: No  follow-ups on file.   Lazaro Arms, MD

## 2021-03-07 NOTE — Telephone Encounter (Signed)
Left message to find out how patient is doing on the lunesta .  Will try again tomorrow or Friday.
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
## 2021-03-29 ENCOUNTER — Other Ambulatory Visit: Payer: Self-pay

## 2021-03-29 ENCOUNTER — Telehealth (INDEPENDENT_AMBULATORY_CARE_PROVIDER_SITE_OTHER): Payer: Medicaid Other | Admitting: Psychiatry

## 2021-03-29 ENCOUNTER — Encounter: Payer: Self-pay | Admitting: Psychiatry

## 2021-03-29 DIAGNOSIS — G4700 Insomnia, unspecified: Secondary | ICD-10-CM | POA: Diagnosis not present

## 2021-03-29 DIAGNOSIS — F419 Anxiety disorder, unspecified: Secondary | ICD-10-CM | POA: Diagnosis not present

## 2021-03-29 DIAGNOSIS — F330 Major depressive disorder, recurrent, mild: Secondary | ICD-10-CM

## 2021-03-29 MED ORDER — CLONAZEPAM 0.5 MG PO TABS: 0.5000 mg | ORAL_TABLET | Freq: Two times a day (BID) | ORAL | 2 refills | Status: DC | PRN

## 2021-03-29 MED ORDER — TRAZODONE HCL 100 MG PO TABS: 200.0000 mg | ORAL_TABLET | Freq: Every day | ORAL | 1 refills | Status: DC

## 2021-03-29 MED ORDER — VENLAFAXINE HCL ER 150 MG PO CP24: ORAL_CAPSULE | ORAL | 3 refills | Status: DC

## 2021-03-29 MED ORDER — NORTRIPTYLINE HCL 25 MG PO CAPS: 25.0000 mg | ORAL_CAPSULE | Freq: Every day | ORAL | 2 refills | Status: DC

## 2021-03-29 MED ORDER — VENLAFAXINE HCL ER 75 MG PO CP24: ORAL_CAPSULE | ORAL | 3 refills | Status: DC

## 2021-03-29 NOTE — Patient Instructions (Signed)
1. Continue venlafaxine 225 mg   2. Continue nortriptyline 25 mg at night 3. Increase Trazodone 200 mg at night as needed for sleep 4. Next appointment: 9/8 at 11 AM

## 2021-05-11 ENCOUNTER — Other Ambulatory Visit: Payer: Self-pay | Admitting: Adult Health
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
## 2021-05-24 ENCOUNTER — Other Ambulatory Visit: Payer: Self-pay

## 2021-05-24 ENCOUNTER — Telehealth (INDEPENDENT_AMBULATORY_CARE_PROVIDER_SITE_OTHER): Payer: Medicaid Other | Admitting: Psychiatry

## 2021-05-24 ENCOUNTER — Encounter: Payer: Self-pay | Admitting: Psychiatry

## 2021-05-24 DIAGNOSIS — G4700 Insomnia, unspecified: Secondary | ICD-10-CM

## 2021-05-24 DIAGNOSIS — F419 Anxiety disorder, unspecified: Secondary | ICD-10-CM | POA: Diagnosis not present

## 2021-05-24 DIAGNOSIS — F330 Major depressive disorder, recurrent, mild: Secondary | ICD-10-CM

## 2021-05-24 MED ORDER — TRAZODONE HCL 100 MG PO TABS: 200.0000 mg | ORAL_TABLET | Freq: Every day | ORAL | 5 refills | Status: DC

## 2021-05-24 NOTE — Patient Instructions (Signed)
1. Increase venlafaxine 225 mg   2. Continue nortriptyline 25 mg at night 3. Continue Buspar 20 mg three times a day 3. ContinueTrazodone 200 mg at night as needed for sleep 4. Continue clonazepam 0.5 mg twice a day 4. Next appointment: 10/27 at 10 AM
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
## 2021-07-12 ENCOUNTER — Telehealth (INDEPENDENT_AMBULATORY_CARE_PROVIDER_SITE_OTHER): Payer: Medicaid Other | Admitting: Psychiatry

## 2021-07-12 ENCOUNTER — Encounter: Payer: Self-pay | Admitting: Psychiatry

## 2021-07-12 ENCOUNTER — Other Ambulatory Visit: Payer: Self-pay

## 2021-07-12 DIAGNOSIS — F419 Anxiety disorder, unspecified: Secondary | ICD-10-CM | POA: Diagnosis not present

## 2021-07-12 DIAGNOSIS — G4700 Insomnia, unspecified: Secondary | ICD-10-CM

## 2021-07-12 DIAGNOSIS — F330 Major depressive disorder, recurrent, mild: Secondary | ICD-10-CM

## 2021-07-12 MED ORDER — BUSPIRONE HCL 10 MG PO TABS: 20.0000 mg | ORAL_TABLET | Freq: Three times a day (TID) | ORAL | 5 refills | Status: DC

## 2021-07-12 MED ORDER — CLONAZEPAM 0.5 MG PO TABS: 0.5000 mg | ORAL_TABLET | Freq: Two times a day (BID) | ORAL | 2 refills | Status: DC | PRN

## 2021-07-12 MED ORDER — VENLAFAXINE HCL ER 150 MG PO CP24: 150.0000 mg | ORAL_CAPSULE | Freq: Every day | ORAL | 4 refills | Status: DC

## 2021-07-12 MED ORDER — NORTRIPTYLINE HCL 25 MG PO CAPS: 25.0000 mg | ORAL_CAPSULE | Freq: Every day | ORAL | 2 refills | Status: DC

## 2021-07-12 MED ORDER — VENLAFAXINE HCL ER 75 MG PO CP24: 75.0000 mg | ORAL_CAPSULE | Freq: Every day | ORAL | 4 refills | Status: DC

## 2021-09-17 ENCOUNTER — Other Ambulatory Visit: Payer: Self-pay | Admitting: Psychiatry

## 2021-10-04 ENCOUNTER — Other Ambulatory Visit: Payer: Self-pay | Admitting: Psychiatry
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
## 2021-10-10 ENCOUNTER — Other Ambulatory Visit: Payer: Self-pay

## 2021-10-10 ENCOUNTER — Telehealth (INDEPENDENT_AMBULATORY_CARE_PROVIDER_SITE_OTHER): Payer: Medicaid Other | Admitting: Psychiatry

## 2021-10-10 ENCOUNTER — Encounter: Payer: Self-pay | Admitting: Psychiatry

## 2021-10-10 DIAGNOSIS — F3341 Major depressive disorder, recurrent, in partial remission: Secondary | ICD-10-CM

## 2021-10-10 DIAGNOSIS — F419 Anxiety disorder, unspecified: Secondary | ICD-10-CM

## 2021-10-10 DIAGNOSIS — G4700 Insomnia, unspecified: Secondary | ICD-10-CM

## 2021-10-10 MED ORDER — NORTRIPTYLINE HCL 25 MG PO CAPS: 25.0000 mg | ORAL_CAPSULE | Freq: Every day | ORAL | 2 refills | Status: DC

## 2021-10-10 MED ORDER — CLONAZEPAM 0.5 MG PO TABS
0.5000 mg | ORAL_TABLET | Freq: Two times a day (BID) | ORAL | 3 refills | Status: DC | PRN
Start: 2021-10-18 — End: 2022-04-23

## 2021-10-10 NOTE — Patient Instructions (Signed)
1. Continue venlafaxine 225 mg   2. Continue nortriptyline 25 mg at night 3. Continue Buspar 20 mg three times a day 3. Continue Trazodone 200 mg at night as needed for sleep 4. Continue clonazepam 0.5 mg twice a day 4. Next appointment:  5/8 at 9:30, in person  The next visit will be in person visit. Please arrive 15 mins before the scheduled time.   [facility]  Address: [address], [place], [place] [address]

## 2021-11-19 ENCOUNTER — Encounter: Payer: Self-pay | Admitting: *Deleted

## 2021-11-20 ENCOUNTER — Other Ambulatory Visit: Payer: Self-pay

## 2021-11-20 ENCOUNTER — Inpatient Hospital Stay (HOSPITAL_COMMUNITY): Payer: Medicaid Other | Attending: Hematology

## 2021-11-20 ENCOUNTER — Other Ambulatory Visit (HOSPITAL_COMMUNITY): Payer: Self-pay | Admitting: Internal Medicine

## 2021-11-20 DIAGNOSIS — Z79899 Other long term (current) drug therapy: Secondary | ICD-10-CM | POA: Insufficient documentation

## 2021-11-20 DIAGNOSIS — D7219 Other eosinophilia: Secondary | ICD-10-CM | POA: Insufficient documentation

## 2021-11-20 DIAGNOSIS — D72820 Lymphocytosis (symptomatic): Secondary | ICD-10-CM

## 2021-11-20 DIAGNOSIS — F17210 Nicotine dependence, cigarettes, uncomplicated: Secondary | ICD-10-CM | POA: Insufficient documentation

## 2021-11-20 DIAGNOSIS — Z1231 Encounter for screening mammogram for malignant neoplasm of breast: Secondary | ICD-10-CM

## 2021-11-20 LAB — CBC WITH DIFFERENTIAL/PLATELET
Abs Immature Granulocytes: 0.12 10*3/uL — ABNORMAL HIGH (ref 0.00–0.07)
Basophils Absolute: 0.1 10*3/uL (ref 0.0–0.1)
Basophils Relative: 1 %
Eosinophils Absolute: 0.3 10*3/uL (ref 0.0–0.5)
Eosinophils Relative: 2 %
HCT: 38.1 % (ref 36.0–46.0)
Hemoglobin: 12.7 g/dL (ref 12.0–15.0)
Immature Granulocytes: 1 %
Lymphocytes Relative: 43 %
Lymphs Abs: 6.7 10*3/uL — ABNORMAL HIGH (ref 0.7–4.0)
MCH: 30.5 pg (ref 26.0–34.0)
MCHC: 33.3 g/dL (ref 30.0–36.0)
MCV: 91.4 fL (ref 80.0–100.0)
Monocytes Absolute: 0.8 10*3/uL (ref 0.1–1.0)
Monocytes Relative: 5 %
Neutro Abs: 7.5 10*3/uL (ref 1.7–7.7)
Neutrophils Relative %: 48 %
Platelets: 359 10*3/uL (ref 150–400)
RBC: 4.17 MIL/uL (ref 3.87–5.11)
RDW: 15 % (ref 11.5–15.5)
WBC: 15.6 10*3/uL — ABNORMAL HIGH (ref 4.0–10.5)
nRBC: 0 % (ref 0.0–0.2)

## 2021-11-20 LAB — LACTATE DEHYDROGENASE: LDH: 112 U/L (ref 98–192)

## 2021-11-26 NOTE — Progress Notes (Signed)
[facility] [address], [place] [address]   CLINIC:  Medical Oncology/Hematology  PCP:  Benita Stabile, MD [address] [place] [address] [phone]   REASON FOR VISIT:  Follow-up for reactive leukocytosis  PRIOR THERAPY: None  CURRENT THERAPY: Observation  INTERVAL HISTORY:  Ms. Tiffany Morgan 51 y.o. female returns for routine follow-up of her reactive leukocytosis.  She was last seen by Dr. Ellin Saba on 11/07/2020.  At today's visit, she reports feeling fair.  No recent hospitalizations, surgeries, or changes in baseline health status.    She continues to smoke, but has cut down to 7 cigarettes daily. She reports that she has hidradenitis suppurativa and has a small boils in her inguinal region every 1 to 2 months, occasionally requiring antibiotic treatment (most recent antibiotics were a month ago) She denies any recent steroids. She has not noticed any new lumps or bumps. She denies any unexplained fever, chills, night sweats, weight loss.  She has little energy an okay d 80% appetite. She endorses that she is maintaining a stable weight.   REVIEW OF SYSTEMS:  Review of Systems  Constitutional:  Positive for fatigue. Negative for appetite change, chills, diaphoresis, fever and unexpected weight change.  HENT:   Negative for lump/mass and nosebleeds.   Eyes:  Negative for eye problems.  Respiratory:  Negative for cough, hemoptysis and shortness of breath.   Cardiovascular:  Positive for palpitations. Negative for chest pain and leg swelling.  Gastrointestinal:  Negative for abdominal pain, blood in stool, constipation, diarrhea, nausea and vomiting.  Genitourinary:  Negative for hematuria.   Skin: Negative.   Neurological:  Positive for headaches. Negative for dizziness and light-headedness.  Hematological:  Does not bruise/bleed easily.     PAST MEDICAL/SURGICAL HISTORY:  Past Medical History:  Diagnosis Date   Anxiety    Arthritis     spine   Bicornate uterus    Chronic back pain    started about 6 years ago - four bulging discs   Depression    Essential hypertension 08/22/2017   Past Surgical History:  Procedure Laterality Date   BREAST BIOPSY Left 01/31/2016   benign   BREAST BIOPSY Left 08/17/2013   benign   CESAREAN SECTION     3   DILATATION AND CURETTAGE/HYSTEROSCOPY WITH MINERVA N/A 02/21/2021   Procedure: DILATATION AND CURETTAGE/HYSTEROSCOPY WITH MINERVA;  Surgeon: Lazaro Arms, MD;  Location: [facility];  Service: Gynecology;  Laterality: N/A;   TUBAL LIGATION       SOCIAL HISTORY:  Social History   Socioeconomic History   Marital status: Single    Spouse name: Not on file   Number of children: 2   Years of education: 12   Highest education level: Not on file  Occupational History   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  Tobacco Use   Smoking status: Every Day    Packs/day: 0.50    Years: 23.00    Pack years: 11.50    Types: Cigarettes    Start date: 09/17/1995   Smokeless tobacco: Never   Tobacco comments:    trying to quit  Vaping Use   Vaping Use: Never used  Substance and Sexual Activity   Alcohol use: No   Drug use: Yes    Types: Marijuana    Comment: 2 joints per week   Sexual activity: Not Currently    Birth control/protection: Surgical    Comment: tubal  Other Topics Concern  Not on file  Social History Narrative   Single   Lives with [name] [name] unable to work due to back pain   Social Determinants of Health   Financial Resource Strain: Not on file  Food Insecurity: Not on file  Transportation Needs: Not on file  Physical Activity: Not on file  Stress: Not on file  Social Connections: Not on file  Intimate Partner Violence: Not on file    FAMILY HISTORY:  Family History  Problem Relation Age of Onset   Hypertension Mother    Depression Mother    Hyperlipidemia Mother    Heart disease Mother    Heart attack Mother    Cancer Father         pancreatic   Mental illness Sister        schizoid   Diabetes Sister    Schizophrenia Sister    Cancer Maternal Grandmother        myleodysplastic syndrome   Depression Maternal Grandmother    Myelodysplastic syndrome Maternal Grandmother    Diabetes Maternal Grandmother    Hypertension Maternal Grandmother    Alcohol abuse Maternal Grandfather     CURRENT MEDICATIONS:  Outpatient Encounter Medications as of 11/27/2021  Medication Sig Note   busPIRone (BUSPAR) 10 MG tablet Take 2 tablets (20 mg total) by mouth 3 (three) times daily.    clonazePAM (KLONOPIN) 0.5 MG tablet Take 1 tablet (0.5 mg total) by mouth 2 (two) times daily as needed for anxiety.    gabapentin (NEURONTIN) 300 MG capsule Take 1 capsule (300 mg total) by mouth 3 (three) times daily.    HYDROcodone-acetaminophen (NORCO/VICODIN) 5-325 MG tablet Take 1 tablet by mouth every 6 (six) hours as needed. (Patient not taking: Reported on 03/02/2021)    hydrOXYzine (VISTARIL) 50 MG capsule Take 100 mg by mouth 2 (two) times daily as needed for anxiety.    IBU 600 MG tablet Take 600 mg by mouth every 6 (six) hours as needed for moderate pain. (Patient not taking: Reported on 03/02/2021)    ketorolac (TORADOL) 10 MG tablet Take 1 tablet (10 mg total) by mouth every 8 (eight) hours as needed. (Patient not taking: Reported on 03/02/2021)    metoprolol tartrate (LOPRESSOR) 25 MG tablet Take 25 mg by mouth 2 (two) times daily.     nortriptyline (PAMELOR) 25 MG capsule Take 1 capsule (25 mg total) by mouth at bedtime.    ondansetron (ZOFRAN ODT) 8 MG disintegrating tablet Take 1 tablet (8 mg total) by mouth every 8 (eight) hours as needed for nausea or vomiting.    oxybutynin (DITROPAN) 5 MG tablet Take 1 tablet (5 mg total) by mouth 3 (three) times daily. (Patient taking differently: Take 5 mg by mouth daily as needed for bladder spasms.)    pantoprazole (PROTONIX) 40 MG tablet Take 40 mg by mouth daily.    rosuvastatin (CRESTOR) 10 MG  tablet Take 10 mg by mouth daily.    SUMAtriptan (IMITREX) 50 MG tablet Take 50 mg by mouth 2 (two) times daily as needed for migraine.    tiZANidine (ZANAFLEX) 4 MG tablet Take 4 mg by mouth in the morning and at bedtime.    traZODone (DESYREL) 100 MG tablet Take 2 tablets (200 mg total) by mouth at bedtime.    venlafaxine XR (EFFEXOR XR) 75 MG 24 hr capsule Take 1 capsule (75 mg total) by mouth daily. Total of 225 mg daily. Take along with 150 mg cap  venlafaxine XR (EFFEXOR-XR) 150 MG 24 hr capsule Take 1 capsule (150 mg total) by mouth daily. Total of 225 mg daily. Take along with 75 mg cap    Vitamin D, Ergocalciferol, (DRISDOL) 1.25 MG (50000 UT) CAPS capsule Take 50,000 Units by mouth every 7 (seven) days. 02/08/2021: Friday or Saturday    No facility-administered encounter medications on file as of 11/27/2021.    ALLERGIES:  Allergies  Allergen Reactions   Bupropion Other (See Comments)    Worsening in SI, anxiety     PHYSICAL EXAM:  ECOG PERFORMANCE STATUS: 1 - Symptomatic but completely ambulatory  There were no vitals filed for this visit. There were no vitals filed for this visit. Physical Exam Constitutional:      Appearance: Normal appearance. She is obese.  HENT:     Head: Normocephalic and atraumatic.     Mouth/Throat:     Mouth: Mucous membranes are moist.  Eyes:     Extraocular Movements: Extraocular movements intact.     Pupils: Pupils are equal, round, and reactive to light.  Cardiovascular:     Rate and Rhythm: Normal rate and regular rhythm.     Pulses: Normal pulses.     Heart sounds: Normal heart sounds.  Pulmonary:     Effort: Pulmonary effort is normal.     Breath sounds: Normal breath sounds.  Abdominal:     General: Bowel sounds are normal.     Palpations: Abdomen is soft.     Tenderness: There is no abdominal tenderness.  Musculoskeletal:        General: No swelling.     Right lower leg: No edema.     Left lower leg: No edema.   Lymphadenopathy:     Cervical: No cervical adenopathy.  Skin:    General: Skin is warm and dry.  Neurological:     General: No focal deficit present.     Mental Status: She is alert and oriented to person, place, and time.  Psychiatric:        Mood and Affect: Mood normal.        Behavior: Behavior normal.     LABORATORY DATA:  I have reviewed the labs as listed.  CBC    Component Value Date/Time   WBC 15.6 (H) 11/20/2021 1334   RBC 4.17 11/20/2021 1334   HGB 12.7 11/20/2021 1334   HCT 38.1 11/20/2021 1334   PLT 359 11/20/2021 1334   MCV 91.4 11/20/2021 1334   MCH 30.5 11/20/2021 1334   MCHC 33.3 11/20/2021 1334   RDW 15.0 11/20/2021 1334   LYMPHSABS 6.7 (H) 11/20/2021 1334   MONOABS 0.8 11/20/2021 1334   EOSABS 0.3 11/20/2021 1334   BASOSABS 0.1 11/20/2021 1334   CMP Latest Ref Rng & Units 02/19/2021 05/05/2018 07/21/2017  Glucose 70 - 99 mg/dL 098(J) 191(Y) 91  BUN 6 - 20 mg/dL 7 7 5(L)  Creatinine 7.82 - 1.00 mg/dL 9.56 2.13 0.86  Sodium 135 - 145 mmol/L 140 137 137  Potassium 3.5 - 5.1 mmol/L 3.8 3.4(L) 4.4  Chloride 98 - 111 mmol/L 109 104 103  CO2 22 - 32 mmol/L 24 26 29   Calcium 8.9 - 10.3 mg/dL 9.5 9.1 9.7  Total Protein 6.5 - 8.1 g/dL 7.6 - 7.1  Total Bilirubin 0.3 - 1.2 mg/dL 0.4 - 0.3  Alkaline Phos 38 - 126 U/L 109 - -  AST 15 - 41 U/L 11(L) - 13  ALT 0 - 44 U/L 13 - 9  DIAGNOSTIC IMAGING:  I have independently reviewed the relevant imaging and discussed with the patient.  ASSESSMENT & PLAN: 1.  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 ongoing since at least 2015 with WBC ranging from 11.1 up to 15.6 with combination of neutrophilic and lymphocytic leukocytosis with occasional eosinophilia - Flow cytometry negative for any lymphoproliferative disorder - BCR/ABL FISH and JAK2 V617F were negative (CALR and MPL not checked) - Most recent CBC (11/20/2021): WBC 15.6, lymphocytes 6.7, benign reactive lymphocytes present on  differential review - She has hidradenitis suppurativa and reports that she has boils and microabscesses in her inguinal region every 1 to 2 months - She does not have any B symptoms, palpable adenopathy, or splenomegaly on exam  - She is suspected to have reactive leukocytosis in the setting of tobacco use, obesity, and hidradenitis suppurativa - PLAN: We will check CALR and MPL for completion.  If negative, we will discharge from clinic for ongoing follow-up with PCP.  Phone visit in 1 month to discuss results and next steps.  All questions were answered. The patient knows to call the clinic with any problems, questions or concerns.  Medical decision making: Low  Time spent on visit: I spent 15 minutes counseling the patient face to face. The total time spent in the appointment was 20 minutes and more than 50% was on counseling.   Carnella Guadalajara, PA-C  11/27/2021 5:10 PM

## 2021-11-27 ENCOUNTER — Inpatient Hospital Stay (HOSPITAL_BASED_OUTPATIENT_CLINIC_OR_DEPARTMENT_OTHER): Payer: Medicaid Other | Admitting: Physician Assistant

## 2021-11-27 ENCOUNTER — Other Ambulatory Visit: Payer: Self-pay

## 2021-11-27 VITALS — BP 147/68 | HR 75 | Temp 96.8°F | Resp 18 | Wt 217.5 lb

## 2021-11-27 DIAGNOSIS — D72829 Elevated white blood cell count, unspecified: Secondary | ICD-10-CM

## 2021-11-27 DIAGNOSIS — D7219 Other eosinophilia: Secondary | ICD-10-CM | POA: Diagnosis not present

## 2021-11-27 NOTE — Patient Instructions (Addendum)
[facility] at [facility] ?Discharge Instructions ? ?You were seen today by Rojelio Brenner PA-C for your elevated white blood cells.  Previous testing has shown that your elevated white blood cells are most likely reactive from your cigarette smoking or your recurrent skin infections (hidradenitis suppurativa).  There have been no signs of any underlying blood abnormality or blood cancer. ? ?We will check additional labs today, and if these are normal, you will not need to follow-up with our clinic again.  Instead, you can follow-up with your primary care provider and have your blood checked at least once per year.  You should be referred back to [place] in the future if you have persistently elevated white blood cells > 20.0 ? ?LABS: Check labs today before you leave the hospital ? ?FOLLOW-UP APPOINTMENT: Phone visit in 1 month to discuss results ? ? ?Thank you for choosing [facility] at [facility] to provide your oncology and hematology care.  To afford each patient quality time with our provider, please arrive at least 15 minutes before your scheduled appointment time.  ? ?If you have a lab appointment with the Cancer Center please come in thru the Main Entrance and check in at the main information desk. ? ?You need to re-schedule your appointment should you arrive 10 or more minutes late.  We strive to give you quality time with our providers, and arriving late affects you and other patients whose appointments are after yours.  Also, if you no show three or more times for appointments you may be dismissed from the clinic at the providers discretion.     ?Again, thank you for choosing [facility]  Our hope is that these requests will decrease the amount of time that you wait before being seen by our physicians.       ?_____________________________________________________________ ? ?Should you have questions after your visit to [facility],  please contact our office at [phone] and follow the prompts.  Our office hours are 8:00 a.m. and 4:30 p.m. Monday - Friday.  Please note that voicemails left after 4:00 p.m. may not be returned until the following business day.  We are closed weekends and major holidays.  You do have access to a nurse 24-7, just call the main number to the clinic [phone] and do not press any options, hold on the line and a nurse will answer the phone.   ? ?For prescription refill requests, have your pharmacy contact our office and allow 72 hours.   ? ?Due to Covid, you will need to wear a mask upon entering the hospital. If you do not have a mask, a mask will be given to you at the Main Entrance upon arrival. For doctor visits, patients may have 1 support person age 24 or older with them. For treatment visits, patients can not have anyone with them due to social distancing guidelines and our immunocompromised population.  ? ? ? ?

## 2021-11-29 ENCOUNTER — Other Ambulatory Visit (HOSPITAL_COMMUNITY): Payer: Medicaid Other

## 2021-11-29 ENCOUNTER — Ambulatory Visit (HOSPITAL_COMMUNITY): Payer: Medicaid Other

## 2021-12-04 ENCOUNTER — Other Ambulatory Visit: Payer: Self-pay | Admitting: Psychiatry

## 2021-12-17 ENCOUNTER — Other Ambulatory Visit: Payer: Self-pay | Admitting: Psychiatry

## 2021-12-28 ENCOUNTER — Telehealth (HOSPITAL_COMMUNITY): Payer: Medicaid Other | Admitting: Physician Assistant

## 2022-01-03 ENCOUNTER — Other Ambulatory Visit: Payer: Self-pay | Admitting: Psychiatry

## 2022-01-16 ENCOUNTER — Other Ambulatory Visit: Payer: Self-pay | Admitting: Psychiatry

## 2022-01-18 ENCOUNTER — Other Ambulatory Visit: Payer: Self-pay | Admitting: Psychiatry
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
## 2022-01-21 ENCOUNTER — Other Ambulatory Visit: Payer: Self-pay | Admitting: Psychiatry

## 2022-01-21 ENCOUNTER — Ambulatory Visit (INDEPENDENT_AMBULATORY_CARE_PROVIDER_SITE_OTHER): Payer: Self-pay | Admitting: *Deleted

## 2022-01-21 ENCOUNTER — Telehealth: Payer: Medicaid Other | Admitting: Psychiatry

## 2022-01-21 VITALS — Ht 67.0 in | Wt 220.0 lb

## 2022-01-21 DIAGNOSIS — Z1211 Encounter for screening for malignant neoplasm of colon: Secondary | ICD-10-CM

## 2022-01-21 NOTE — Progress Notes (Addendum)
Gastroenterology Pre-Procedure Review  Request Date: 01/21/2022 Requesting Physician: Leone Payor, NP, no previous TCS  PATIENT REVIEW QUESTIONS: The patient responded to the following health history questions as indicated:    1. Diabetes Melitis: no 2. Joint replacements in the past 12 months: no 3. Major health problems in the past 3 months: no 4. Has an artificial valve or MVP: no 5. Has a defibrillator: no 6. Has been advised in past to take antibiotics in advance of a procedure like teeth cleaning: yes 7. Family history of colon cancer: no 8. Alcohol Use: no 9. Illicit drug Use: no 10. History of sleep apnea: no 11. History of coronary artery or other vascular stents placed within the last 12 months: no 12. History of any prior anesthesia complications: no 13. Body mass index is 34.46 kg/m.    MEDICATIONS & ALLERGIES:    Patient reports the following regarding taking any blood thinners:   Plavix? no Aspirin? no Coumadin? no Brilinta? no Xarelto? no Eliquis? no Pradaxa? no Savaysa? no Effient? no  Patient confirms/reports the following medications:  Current Outpatient Medications  Medication Sig Dispense Refill   busPIRone (BUSPAR) 10 MG tablet Take 2 tablets (20 mg total) by mouth 3 (three) times daily. 180 tablet 5   clonazePAM (KLONOPIN) 0.5 MG tablet Take 1 tablet (0.5 mg total) by mouth 2 (two) times daily as needed for anxiety. 60 tablet 3   gabapentin (NEURONTIN) 300 MG capsule Take 1 capsule (300 mg total) by mouth 3 (three) times daily. 90 capsule 0   IBU 600 MG tablet Take 600 mg by mouth as needed for moderate pain.     metoprolol tartrate (LOPRESSOR) 25 MG tablet Take 25 mg by mouth 2 (two) times daily.      nortriptyline (PAMELOR) 25 MG capsule Take 1 capsule (25 mg total) by mouth at bedtime. 30 capsule 2   oxybutynin (DITROPAN) 5 MG tablet as needed.     pantoprazole (PROTONIX) 40 MG tablet Take 40 mg by mouth daily.     rosuvastatin (CRESTOR) 10 MG  tablet Take 10 mg by mouth daily.     SUMAtriptan (IMITREX) 50 MG tablet Take 50 mg by mouth as needed for migraine.     tiZANidine (ZANAFLEX) 2 MG tablet Take 2 mg by mouth 2 (two) times daily as needed.     traZODone (DESYREL) 100 MG tablet TAKE 2 TABLETS BY MOUTH AT BEDTIME AS NEEDED FOR SLEEP. 60 tablet 0   venlafaxine XR (EFFEXOR-XR) 150 MG 24 hr capsule TAKE 1 CAPSULE BY MOUTH ONCEDAILY WITH 75MG  VENLAFAXINE. (Patient taking differently: Takes only 150 mg daily.) 30 capsule 0   Vitamin D, Ergocalciferol, (DRISDOL) 1.25 MG (50000 UT) CAPS capsule Take 50,000 Units by mouth every 7 (seven) days.     No current facility-administered medications for this visit.    Patient confirms/reports the following allergies:  Allergies  Allergen Reactions   Bupropion Other (See Comments)    Worsening in SI, anxiety    No orders of the defined types were placed in this encounter.   AUTHORIZATION INFORMATION Primary Insurance: Medicaid [employer],  ID #: T,  Group #: [facility] Pre-Cert / Auth required: Yes, approved online [facility] Pre-Cert / Auth #[id number]  SCHEDULE INFORMATION: Procedure has been scheduled as follows:  Date: 03/05/2022, Time: 9:00 Location: [facility] with Dr. 03/07/2022  This Gastroenterology Pre-Precedure Review Form is being routed to the following provider(s): Marletta Lor, NP

## 2022-01-21 NOTE — Telephone Encounter (Signed)
Refill request declined as she has enough according to Epic. Could you contact the pharmacy if the patient truly needs this refill; this is the second time I declined the request. Thanks ?

## 2022-01-29 NOTE — Progress Notes (Signed)
ASA 2. Appropriate.  ?

## 2022-01-30 ENCOUNTER — Encounter: Payer: Self-pay | Admitting: *Deleted

## 2022-01-30 MED ORDER — NA SULFATE-K SULFATE-MG SULF 17.5-3.13-1.6 GM/177ML PO SOLN: 1.0000 | Freq: Once | ORAL | 0 refills | Status: AC

## 2022-01-30 NOTE — Progress Notes (Signed)
Spoke to pt.  Scheduled procedure for 03/05/2022 at 9:00, arrival 7:30 at [facility].  Reviewed prep instructions with pt by phone.  Pt aware that I sent RX to her pharmacy.  Confirmed mailing address and mailed instructions.   ?

## 2022-01-30 NOTE — Addendum Note (Signed)
Addended by: Noreene Larsson on: 01/30/2022 09:45 AM ? ? Modules accepted: Orders ? ?

## 2022-01-31 ENCOUNTER — Other Ambulatory Visit: Payer: Self-pay | Admitting: Psychiatry

## 2022-02-01 ENCOUNTER — Other Ambulatory Visit: Payer: Self-pay | Admitting: Psychiatry

## 2022-02-01 ENCOUNTER — Telehealth: Payer: Self-pay

## 2022-02-01 MED ORDER — BUSPIRONE HCL 10 MG PO TABS: 20.0000 mg | ORAL_TABLET | Freq: Three times a day (TID) | ORAL | 5 refills | Status: DC

## 2022-02-01 NOTE — Telephone Encounter (Signed)
pt needsrefill on the buspar and the klonopin

## 2022-02-01 NOTE — Telephone Encounter (Signed)
Ordered buspar. Tiffany Morgan is not due until next month, so it is not ordered (she has an appointment later this month)

## 2022-02-04 ENCOUNTER — Other Ambulatory Visit: Payer: Self-pay | Admitting: *Deleted
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
## 2022-02-08 ENCOUNTER — Telehealth (INDEPENDENT_AMBULATORY_CARE_PROVIDER_SITE_OTHER): Payer: Medicaid Other | Admitting: Psychiatry

## 2022-02-08 ENCOUNTER — Encounter: Payer: Self-pay | Admitting: Psychiatry

## 2022-02-08 DIAGNOSIS — F419 Anxiety disorder, unspecified: Secondary | ICD-10-CM | POA: Diagnosis not present

## 2022-02-08 DIAGNOSIS — F330 Major depressive disorder, recurrent, mild: Secondary | ICD-10-CM

## 2022-02-08 DIAGNOSIS — G4700 Insomnia, unspecified: Secondary | ICD-10-CM

## 2022-02-08 MED ORDER — VENLAFAXINE HCL ER 75 MG PO CP24: 75.0000 mg | ORAL_CAPSULE | Freq: Every day | ORAL | 2 refills | Status: DC

## 2022-02-08 MED ORDER — NORTRIPTYLINE HCL 25 MG PO CAPS: 25.0000 mg | ORAL_CAPSULE | Freq: Every day | ORAL | 1 refills | Status: DC

## 2022-02-08 NOTE — Patient Instructions (Signed)
Increase venlafaxine 225 mg daily     Continue nortriptyline 25 mg at night Continue Buspar 20 mg three times a day Continue Trazodone 200 mg at night as needed for sleep Continue clonazepam 0.5 mg twice a day Next appointment:  8/10 at 11 AM

## 2022-02-14 ENCOUNTER — Encounter: Payer: Self-pay | Admitting: Psychiatry

## 2022-03-04 ENCOUNTER — Encounter (HOSPITAL_COMMUNITY): Payer: Self-pay | Admitting: Anesthesiology

## 2022-03-05 ENCOUNTER — Encounter (HOSPITAL_COMMUNITY): Admission: RE | Payer: Self-pay | Source: Home / Self Care

## 2022-03-05 ENCOUNTER — Ambulatory Visit (HOSPITAL_COMMUNITY): Admission: RE | Admit: 2022-03-05 | Payer: Medicaid Other | Source: Home / Self Care

## 2022-03-05 DIAGNOSIS — M479 Spondylosis, unspecified: Secondary | ICD-10-CM

## 2022-03-05 DIAGNOSIS — D72820 Lymphocytosis (symptomatic): Secondary | ICD-10-CM

## 2022-03-05 DIAGNOSIS — F410 Panic disorder [episodic paroxysmal anxiety] without agoraphobia: Secondary | ICD-10-CM

## 2022-03-05 DIAGNOSIS — F419 Anxiety disorder, unspecified: Secondary | ICD-10-CM

## 2022-03-05 DIAGNOSIS — I10 Essential (primary) hypertension: Secondary | ICD-10-CM

## 2022-03-05 DIAGNOSIS — E559 Vitamin D deficiency, unspecified: Secondary | ICD-10-CM

## 2022-03-05 DIAGNOSIS — N920 Excessive and frequent menstruation with regular cycle: Secondary | ICD-10-CM

## 2022-03-05 DIAGNOSIS — F331 Major depressive disorder, recurrent, moderate: Secondary | ICD-10-CM

## 2022-03-05 DIAGNOSIS — G8929 Other chronic pain: Secondary | ICD-10-CM

## 2022-03-05 DIAGNOSIS — N946 Dysmenorrhea, unspecified: Secondary | ICD-10-CM

## 2022-03-05 DIAGNOSIS — E663 Overweight: Secondary | ICD-10-CM

## 2022-03-05 DIAGNOSIS — I700 Atherosclerosis of aorta: Secondary | ICD-10-CM

## 2022-03-05 DIAGNOSIS — Z720 Tobacco use: Secondary | ICD-10-CM

## 2022-03-05 DIAGNOSIS — F4011 Social phobia, generalized: Secondary | ICD-10-CM

## 2022-03-05 DIAGNOSIS — Q513 Bicornate uterus: Secondary | ICD-10-CM

## 2022-03-05 SURGERY — COLONOSCOPY WITH PROPOFOL
Anesthesia: Monitor Anesthesia Care

## 2022-04-22 ENCOUNTER — Other Ambulatory Visit: Payer: Self-pay | Admitting: Psychiatry
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
## 2022-04-24 ENCOUNTER — Other Ambulatory Visit: Payer: Self-pay

## 2022-04-25 ENCOUNTER — Ambulatory Visit: Payer: Medicaid Other | Admitting: Psychiatry

## 2022-05-03 ENCOUNTER — Telehealth: Payer: Self-pay | Admitting: Psychiatry

## 2022-05-03 ENCOUNTER — Telehealth: Payer: Self-pay

## 2022-05-03 NOTE — Telephone Encounter (Signed)
I am not sure about the coverage. Please ask the manager or someone who has accurate information about this. If she has difficulty in continuing to see me, some primary care practice have sliding scale, and they may be able to fill her medication, although I cannot guarantee.

## 2022-05-03 NOTE — Telephone Encounter (Signed)
called patient she was given some resources in [place] lives at and i also told her to look up [employer] and she it qualifies her with medical insurance.

## 2022-05-03 NOTE — Telephone Encounter (Signed)
can you send in all her medication for a 90 day supply since she will not have insurance anymore.

## 2022-05-03 NOTE — Telephone Encounter (Signed)
Patient called stating that she is unable to schedule appointment due to full coverage of Medicaid running out. She only has Medicaid Family Planning she stated. She is trying to get full coverage. But in the meantime is concerned about her medications and how that works. I told her that she would be paying out of pocket for medication and office visits if Medicaid does not cover this. Patient stated that is what she thought but it has increased her anxiety and still wanted a note to the physician.  Please advise if I am incorrect.

## 2022-05-04 ENCOUNTER — Other Ambulatory Visit: Payer: Self-pay | Admitting: Psychiatry

## 2022-05-04 MED ORDER — NORTRIPTYLINE HCL 25 MG PO CAPS: 25.0000 mg | ORAL_CAPSULE | Freq: Every day | ORAL | 2 refills | Status: DC

## 2022-05-04 MED ORDER — TRAZODONE HCL 100 MG PO TABS: 200.0000 mg | ORAL_TABLET | Freq: Every evening | ORAL | 0 refills | Status: DC | PRN

## 2022-05-04 MED ORDER — BUSPIRONE HCL 10 MG PO TABS: 20.0000 mg | ORAL_TABLET | Freq: Three times a day (TID) | ORAL | 0 refills | Status: DC

## 2022-05-04 MED ORDER — NORTRIPTYLINE HCL 25 MG PO CAPS: 25.0000 mg | ORAL_CAPSULE | Freq: Every day | ORAL | 0 refills | Status: DC

## 2022-05-04 MED ORDER — VENLAFAXINE HCL ER 150 MG PO CP24: 150.0000 mg | ORAL_CAPSULE | Freq: Every day | ORAL | 0 refills | Status: DC

## 2022-05-04 MED ORDER — VENLAFAXINE HCL ER 75 MG PO CP24: 75.0000 mg | ORAL_CAPSULE | Freq: Every day | ORAL | 0 refills | Status: DC

## 2022-05-04 MED ORDER — CLONAZEPAM 0.5 MG PO TABS: 0.5000 mg | ORAL_TABLET | Freq: Two times a day (BID) | ORAL | 0 refills | Status: DC | PRN

## 2022-05-04 MED ORDER — VENLAFAXINE HCL ER 75 MG PO CP24: 75.0000 mg | ORAL_CAPSULE | Freq: Every day | ORAL | 2 refills | Status: DC

## 2022-05-04 NOTE — Telephone Encounter (Signed)
-   Ordered medication for 90 days each. Please note that I filled clonazepam for month only to be filled in Oct as she filled it twice in August for 30 days each according to the database. Please advise her to find a primary care provider so that she can continue her medication.  - Please contact the pharmacy to CANCEL monthly refill orders of buspar, venlafaxine 75 mg/150 mg, nortriptyline and trazodone.

## 2022-05-06 NOTE — Telephone Encounter (Signed)
Pharmacy called spoke with pharmacy he states someone already had called him to just do the 90 day refills and to cancel any other refill request.

## 2022-05-06 NOTE — Telephone Encounter (Signed)
Sounds great,  thank you. (On another message, I asked you to contact pharmacy to cancel orders- no need to do this anymore as I was able to speak with them,fyi)

## 2022-06-03 ENCOUNTER — Inpatient Hospital Stay: Payer: Medicaid Other | Attending: Hematology | Admitting: Hematology

## 2022-06-03 ENCOUNTER — Ambulatory Visit (HOSPITAL_COMMUNITY)
Admission: RE | Admit: 2022-06-03 | Discharge: 2022-06-03 | Disposition: A | Discharge status: Home / Self Care | Payer: Medicaid Other | Source: Ambulatory Visit | Attending: Internal Medicine | Admitting: Internal Medicine

## 2022-06-03 DIAGNOSIS — D7219 Other eosinophilia: Secondary | ICD-10-CM | POA: Diagnosis present

## 2022-06-03 DIAGNOSIS — Z1231 Encounter for screening mammogram for malignant neoplasm of breast: Secondary | ICD-10-CM | POA: Insufficient documentation

## 2022-06-03 DIAGNOSIS — D72829 Elevated white blood cell count, unspecified: Secondary | ICD-10-CM

## 2022-06-03 IMAGING — US US PELVIS COMPLETE WITH TRANSVAGINAL
2 series · 13 of 25 positions shown · non-contrast
Comparison: CT 06/02/2019, pelvic ultrasound 05/04/2018

CLINICAL DATA: Menorrhagia

EXAM:
TRANSABDOMINAL AND TRANSVAGINAL ULTRASOUND OF PELVIS
TECHNIQUE: Both transabdominal and transvaginal ultrasound examinations of the
pelvis were performed. Transabdominal technique was performed for
global imaging of the pelvis including uterus, ovaries, adnexal
regions, and pelvic cul-de-sac. It was necessary to proceed with
endovaginal exam following the transabdominal exam to visualize the
uterus endometrium ovaries.

[Series 1: gyn us · 12 of 77 slices shown (1 of 2)]
[im 1/77]
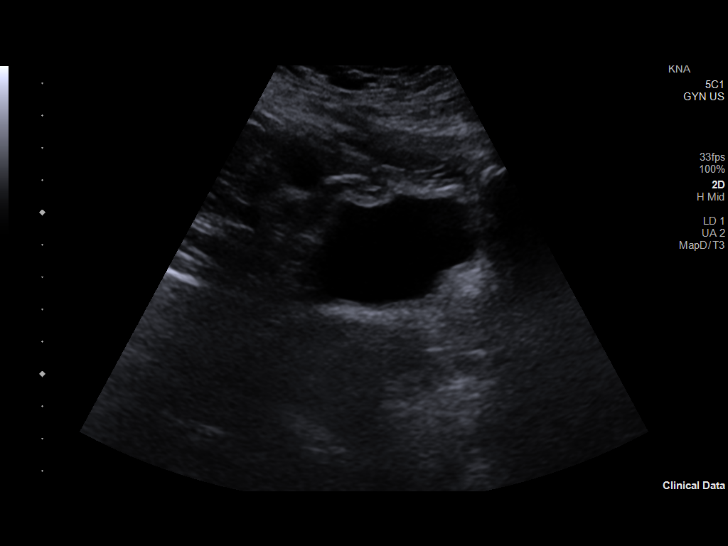
[im 7/77]
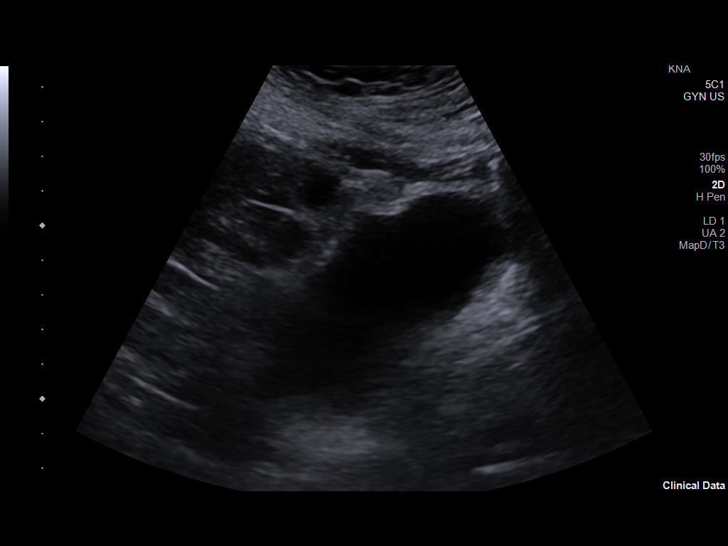
[im 14/77]
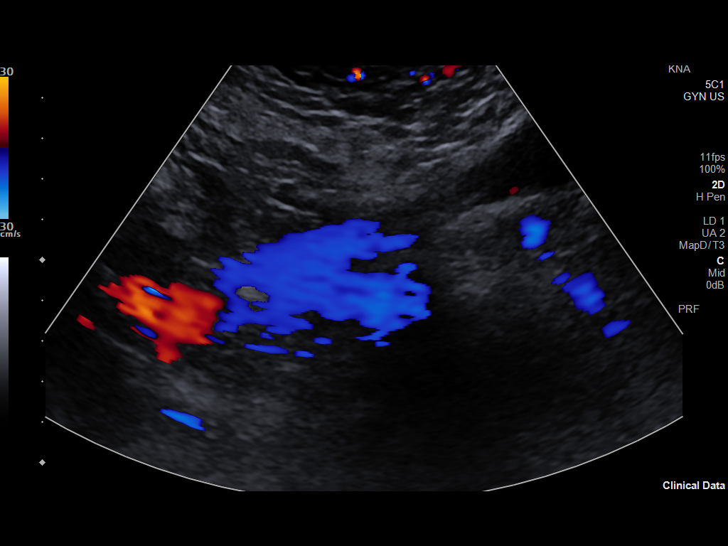
[im 20/77]
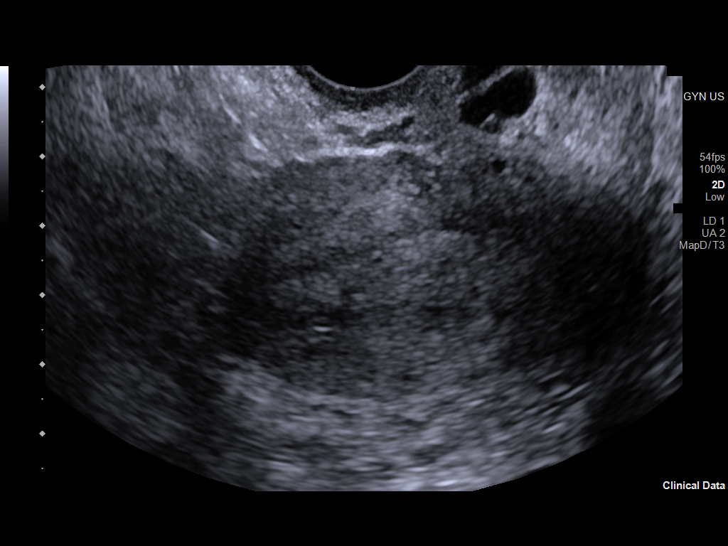
[im 27/77]
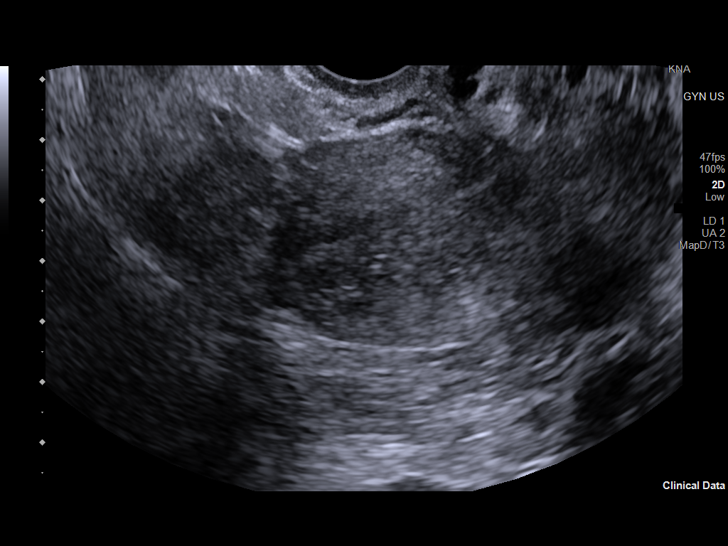
[im 34/77]
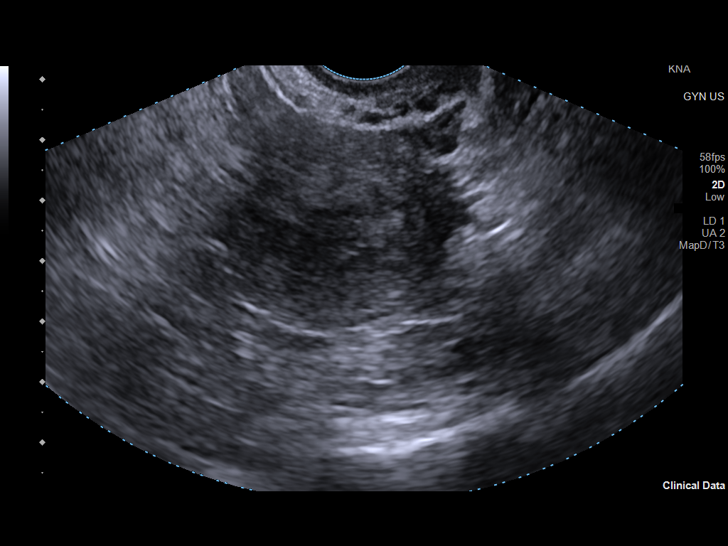
[im 40/77]
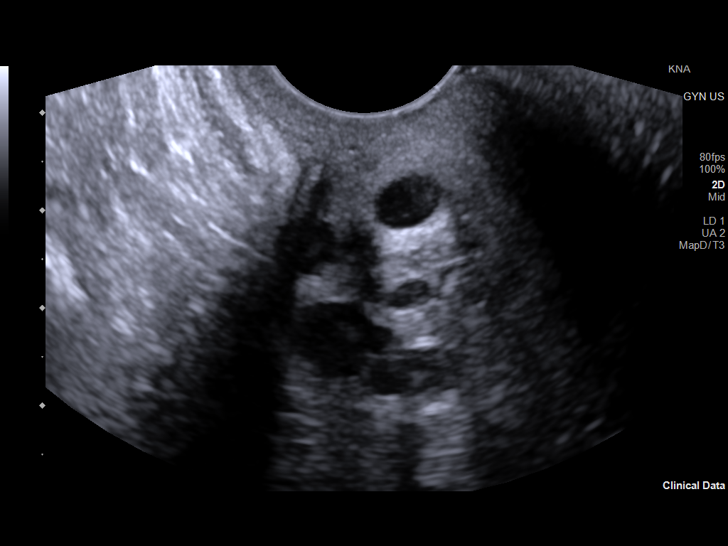
[im 47/77]
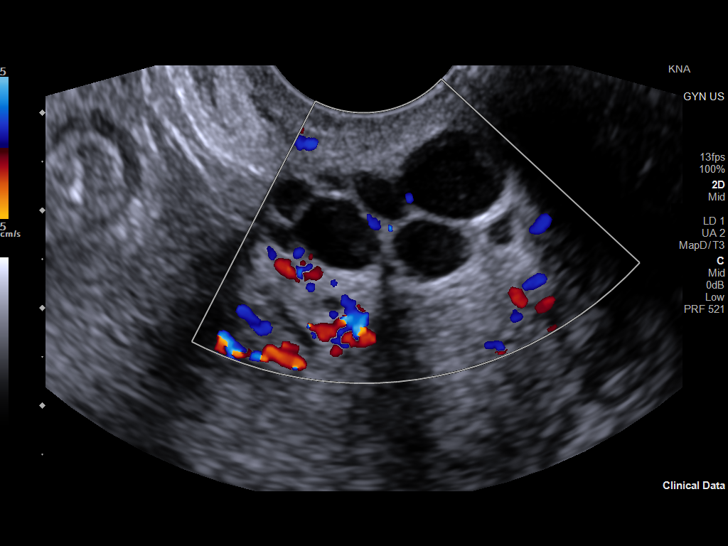
[im 53/77]
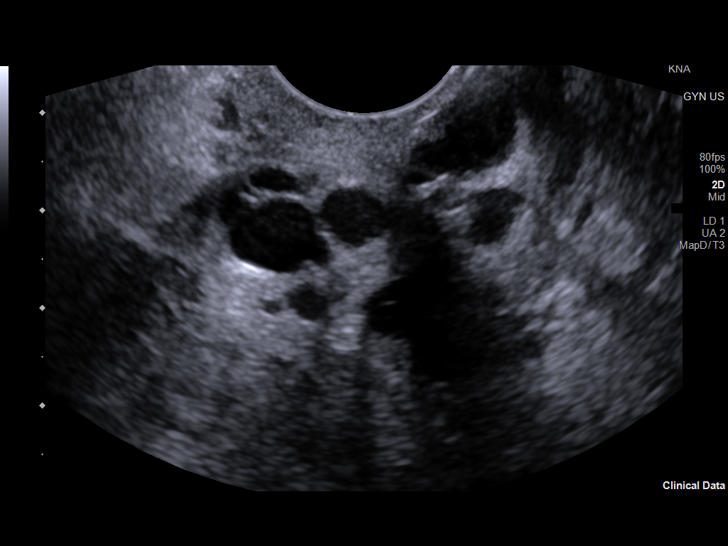
[im 60/77]
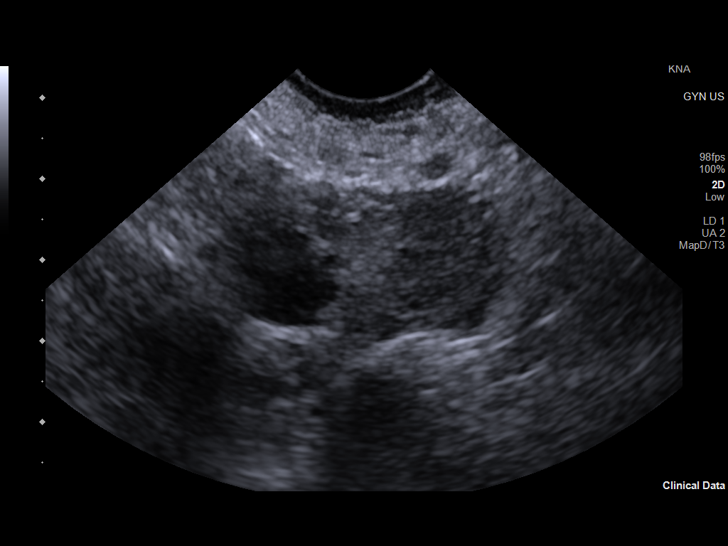
[im 67/77]
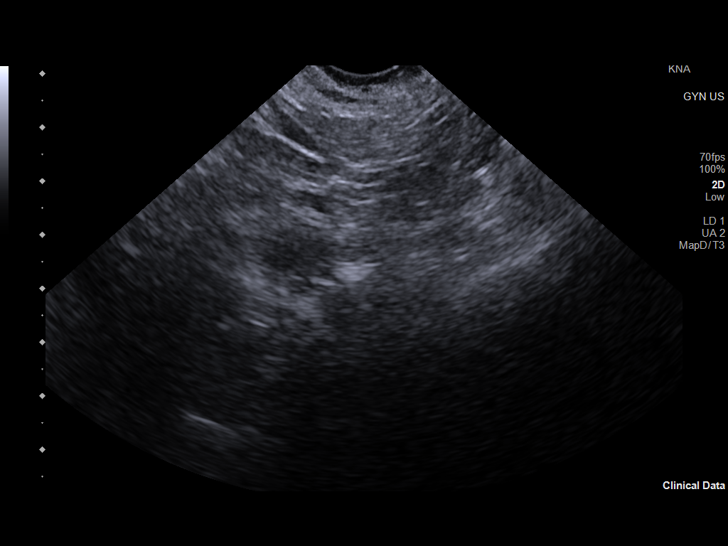
[im 73/77]
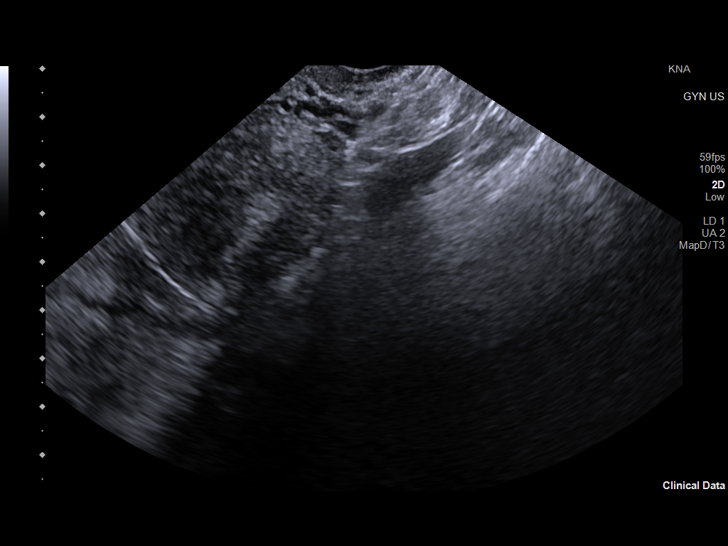

[Series 1001: gyn us · 1 of 2 slices shown (2 of 2)]
[im 1/2]
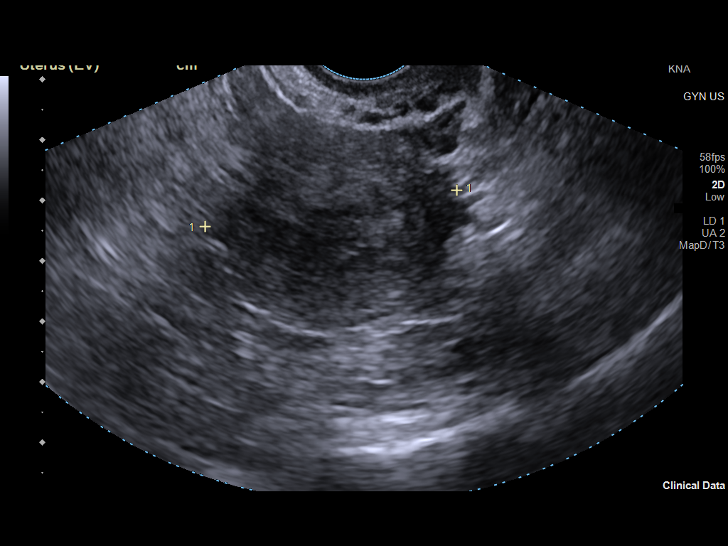

[13 of 25 positions shown; findings below may reference images not displayed]

FINDINGS: Uterus

Measurements: 7.5 x 3.9 x 4.2 cm = volume: 64.3 mL. Multiple
nabothian cysts in the cervix. History of bicornuate uterus

Endometrium

Thickness: 9.4 mm maximum thickness. No focal abnormality
visualized.

Right ovary

Measurements: 3.8 x 1.8 x 2.1 cm = volume: 7.4 mL. Negative for mass

Left ovary

Not seen

Other findings

No abnormal free fluid.
IMPRESSION: 1. Normal premenopausal endometrial thickness.
2. Appearance suggests mullerian duct anomaly, presumably
corresponding to history of bicornuate uterus
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
## 2022-06-09 LAB — CALR +MPL + E12-E15  (REFLEX)

## 2022-06-09 LAB — JAK2 V617F RFX CALR/MPL/E12-15

## 2022-06-10 ENCOUNTER — Other Ambulatory Visit (HOSPITAL_COMMUNITY): Payer: Self-pay | Admitting: Internal Medicine

## 2022-06-10 DIAGNOSIS — R928 Other abnormal and inconclusive findings on diagnostic imaging of breast: Secondary | ICD-10-CM

## 2022-06-10 NOTE — Progress Notes (Unsigned)
Virtual Visit via Telephone Note [facility]  I connected with Tiffany Morgan  on 06/11/22 at  9:21 AM by telephone and verified that I am speaking with the correct person using two identifiers.  Location: Patient: Home Provider: [facility]   I discussed the limitations, risks, security and privacy concerns of performing an evaluation and management service by telephone and the availability of in person appointments. I also discussed with the patient that there may be a patient responsible charge related to this service. The patient expressed understanding and agreed to proceed.  REASON FOR VISIT: Reactive leukocytosis  PRIOR THERAPY: None  CURRENT THERAPY: Observation  INTERVAL HISTORY: Ms. Tiffany Morgan is contacted today for follow-up of her reactive leukocytosis.  She was last seen by Tarri Abernethy PA-C on 11/27/2021.  At today's visit, she reports feeling for her.  No recent hospitalizations, surgeries, or changes in baseline health status.     She continues to smoke 0.5 PPD cigarettes, but is trying to quit.  She reports that she has hidradenitis suppurativa and has a small boils in her inguinal region every 1 to 2 months, occasionally requiring antibiotic treatment (most recent antibiotics were last week).  She denies any recent steroids.  She has not noticed any new lumps or bumps, other than those associated with her hidradenitis suppurativa.  She denies any unexplained fever, chills, night sweats, weight loss.     She has little to no energy and 85% appetite. She endorses that she is maintaining a stable weight.    OBSERVATIONS/OBJECTIVE: Review of Systems  Constitutional:  Positive for malaise/fatigue. Negative for chills, diaphoresis, fever and weight loss.  Respiratory:  Negative for cough and shortness of breath.   Cardiovascular:  Positive for chest pain. Negative for palpitations.  Gastrointestinal:  Negative for abdominal  pain, blood in stool, melena, nausea and vomiting.  Musculoskeletal:  Positive for back pain.  Neurological:  Positive for tingling. Negative for dizziness and headaches.  Psychiatric/Behavioral:  Positive for depression.      PHYSICAL EXAM (per limitations of virtual telephone visit): The patient is alert and oriented x 3, exhibiting adequate mentation, good mood, and ability to speak in full sentences and execute sound judgement.   ASSESSMENT & PLAN: 1.   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 ongoing since at least 2015 with WBC ranging from 11.1 up to 15.6 with combination of neutrophilic and lymphocytic leukocytosis with occasional eosinophilia - Flow cytometry negative for any lymphoproliferative disorder - BCR/ABL FISH and JAK2 [id number] were negative  - Most recent CBC (11/20/2021): WBC 15.6, lymphocytes 6.7, benign reactive lymphocytes present on differential review - CALR and MPL negative (06/03/2022) - She has hidradenitis suppurativa and reports that she has boils and microabscesses in her inguinal region every 1 to 2 months - She did not have any B symptoms, palpable adenopathy, or splenomegaly on exam at last office visit - She is suspected to have reactive leukocytosis in the setting of tobacco use, obesity, and hidradenitis suppurativa - PLAN: Myeloproliferative disorder has been ruled out.  Suspect reactive leukocytosis in the setting of tobacco use, obesity, and her adenitis suppurativa.  No further hematology work-up is needed.  We will discharge patient from clinic.  She should continue to follow with her  PCP with CBC checked at least annually.  She can be referred back to [place] in the future if WBC persistently >20,000 (in the absence of acute infection) or if he develops  unexplained B symptoms in the presence of any amount of leukocytosis.   FOLLOW UP INSTRUCTIONS:  Discharge from clinic    I discussed the assessment and treatment plan with the  patient. The patient was provided an opportunity to ask questions and all were answered. The patient agreed with the plan and demonstrated an understanding of the instructions.   The patient was advised to call back or seek an in-person evaluation if the symptoms worsen or if the condition fails to improve as anticipated.  I provided 8 minutes of non-face-to-face time during this encounter.   Carnella Guadalajara, PA-C 06/11/2022 9:37 AM

## 2022-06-11 ENCOUNTER — Encounter: Payer: Self-pay | Admitting: Physician Assistant

## 2022-06-11 ENCOUNTER — Ambulatory Visit (INDEPENDENT_AMBULATORY_CARE_PROVIDER_SITE_OTHER): Payer: Medicaid Other | Admitting: Psychiatry

## 2022-06-11 ENCOUNTER — Encounter: Payer: Self-pay | Admitting: Psychiatry

## 2022-06-11 ENCOUNTER — Inpatient Hospital Stay (HOSPITAL_BASED_OUTPATIENT_CLINIC_OR_DEPARTMENT_OTHER): Payer: Medicaid Other | Admitting: Physician Assistant

## 2022-06-11 ENCOUNTER — Other Ambulatory Visit
Admission: RE | Admit: 2022-06-11 | Discharge: 2022-06-11 | Disposition: A | Discharge status: Home / Self Care | Payer: Medicaid Other | Source: Ambulatory Visit | Attending: Psychiatry | Admitting: Psychiatry

## 2022-06-11 VITALS — BP 108/72 | HR 75 | Temp 97.9°F | Ht 67.0 in | Wt 208.0 lb

## 2022-06-11 DIAGNOSIS — F419 Anxiety disorder, unspecified: Secondary | ICD-10-CM | POA: Diagnosis not present

## 2022-06-11 DIAGNOSIS — D72829 Elevated white blood cell count, unspecified: Secondary | ICD-10-CM

## 2022-06-11 DIAGNOSIS — F331 Major depressive disorder, recurrent, moderate: Secondary | ICD-10-CM | POA: Insufficient documentation

## 2022-06-11 DIAGNOSIS — G4700 Insomnia, unspecified: Secondary | ICD-10-CM

## 2022-06-11 LAB — CBC
HCT: 42 % (ref 36.0–46.0)
Hemoglobin: 13.7 g/dL (ref 12.0–15.0)
MCH: 30.6 pg (ref 26.0–34.0)
MCHC: 32.6 g/dL (ref 30.0–36.0)
MCV: 94 fL (ref 80.0–100.0)
Platelets: 337 10*3/uL (ref 150–400)
RBC: 4.47 MIL/uL (ref 3.87–5.11)
RDW: 15.9 % — ABNORMAL HIGH (ref 11.5–15.5)
WBC: 11.8 10*3/uL — ABNORMAL HIGH (ref 4.0–10.5)
nRBC: 0 % (ref 0.0–0.2)

## 2022-06-11 LAB — TSH: TSH: 1.321 u[IU]/mL (ref 0.350–4.500)

## 2022-06-11 MED ORDER — VENLAFAXINE HCL ER 150 MG PO CP24: 150.0000 mg | ORAL_CAPSULE | Freq: Every day | ORAL | 0 refills | Status: DC

## 2022-06-11 MED ORDER — NORTRIPTYLINE HCL 25 MG PO CAPS: 25.0000 mg | ORAL_CAPSULE | Freq: Every day | ORAL | 0 refills | Status: DC

## 2022-06-11 MED ORDER — CLONAZEPAM 0.5 MG PO TABS: 0.5000 mg | ORAL_TABLET | Freq: Two times a day (BID) | ORAL | 1 refills | Status: DC | PRN

## 2022-06-11 MED ORDER — TRAZODONE HCL 100 MG PO TABS: 200.0000 mg | ORAL_TABLET | Freq: Every evening | ORAL | 0 refills | Status: DC | PRN

## 2022-06-11 MED ORDER — VENLAFAXINE HCL ER 75 MG PO CP24: 75.0000 mg | ORAL_CAPSULE | Freq: Every day | ORAL | 0 refills | Status: DC

## 2022-06-11 MED ORDER — BUSPIRONE HCL 10 MG PO TABS: 20.0000 mg | ORAL_TABLET | Freq: Three times a day (TID) | ORAL | 0 refills | Status: DC

## 2022-06-20 ENCOUNTER — Encounter (HOSPITAL_COMMUNITY): Payer: Medicaid Other

## 2022-06-20 ENCOUNTER — Encounter (HOSPITAL_COMMUNITY): Payer: Self-pay

## 2022-06-20 ENCOUNTER — Ambulatory Visit (HOSPITAL_COMMUNITY): Payer: Medicaid Other

## 2022-08-05 ENCOUNTER — Emergency Department (HOSPITAL_COMMUNITY): Payer: Self-pay

## 2022-08-05 ENCOUNTER — Emergency Department (HOSPITAL_COMMUNITY)
Admission: EM | Admit: 2022-08-05 | Discharge: 2022-08-05 | Disposition: A | Discharge status: Home / Self Care | Payer: Self-pay | Attending: Emergency Medicine | Admitting: Emergency Medicine

## 2022-08-05 ENCOUNTER — Other Ambulatory Visit: Payer: Self-pay

## 2022-08-05 DIAGNOSIS — S99921A Unspecified injury of right foot, initial encounter: Secondary | ICD-10-CM | POA: Insufficient documentation

## 2022-08-05 DIAGNOSIS — S90121A Contusion of right lesser toe(s) without damage to nail, initial encounter: Secondary | ICD-10-CM

## 2022-08-05 DIAGNOSIS — W2203XA Walked into furniture, initial encounter: Secondary | ICD-10-CM | POA: Insufficient documentation

## 2022-08-05 MED ORDER — IBUPROFEN 800 MG PO TABS
800.0000 mg | ORAL_TABLET | Freq: Once | ORAL | Status: AC
  Administered 2022-08-05: 800 mg via ORAL
  Filled 2022-08-05: qty 1

## 2022-08-05 NOTE — ED Triage Notes (Signed)
Pt in with pain to R 5th toe - states a door swung back and hit her R forehead and R pinky toe this morning. Limited ROM to R 5th toe, no open wound present. Ambulatory to triage room

## 2022-08-05 NOTE — ED Notes (Signed)
Patient verbalizes understanding of discharge instructions. Opportunity for questioning and answers were provided. Armband removed by staff, pt discharged from ED. Ambulated out to lobby  

## 2022-08-05 NOTE — ED Provider Notes (Signed)
[facility] EMERGENCY DEPARTMENT Provider Note   CSN: [id number] Arrival date & time: 08/05/22  1507     History  Chief Complaint  Patient presents with   Toe Pain    Tiffany Morgan is a 51 y.o. female.  Who presents ED for evaluation of right fifth toe pain.  Patient states she opened the door at approximately 8:00 this morning and hit her right side of her face and her right fifth toe.  Did not hit any other toes.  States the pain was instant and throbbing.  She took Tylenol and took a nap, but still had pain when she woke up today presents to the ED for this.  Pain is currently a 4 or 5 out of 10.  Denies numbness or tingling.  Patient is able to ambulate, but states it hurts.   Toe Pain       Home Medications Prior to Admission medications   Medication Sig Start Date End Date Taking? Authorizing Provider  busPIRone (BUSPAR) 10 MG tablet Take 2 tablets (20 mg total) by mouth 3 (three) times daily. 08/03/22 11/01/22  [name], MD  clonazePAM (KLONOPIN) 0.5 MG tablet Take 1 tablet (0.5 mg total) by mouth 2 (two) times daily as needed for anxiety. 06/23/22 07/23/22  [name], MD  clonazePAM (KLONOPIN) 0.5 MG tablet Take 1 tablet (0.5 mg total) by mouth 2 (two) times daily as needed for anxiety. 07/24/22 09/22/22  [name], MD  gabapentin (NEURONTIN) 300 MG capsule Take 1 capsule (300 mg total) by mouth 3 (three) times daily. 09/01/20   Daphine Deutscher Mary-Margaret, FNP  hydrOXYzine (VISTARIL) 50 MG capsule Take 100 mg by mouth 2 (two) times daily. 05/28/22   [provider]  ibuprofen (MOTRIN IB) 200 MG tablet Take 200 mg by mouth every 6 (six) hours as needed for mild pain or headache.    [provider]  metoprolol tartrate (LOPRESSOR) 25 MG tablet Take 25 mg by mouth 2 (two) times daily.     [provider]  nortriptyline (PAMELOR) 25 MG capsule Take 1 capsule (25 mg total) by mouth at bedtime. 08/03/22 11/01/22  [name], MD  oxybutynin  (DITROPAN) 5 MG tablet Take 5 mg by mouth daily as needed for bladder spasms.    [provider]  pantoprazole (PROTONIX) 40 MG tablet Take 40 mg by mouth daily. 09/20/20   [provider]  rosuvastatin (CRESTOR) 10 MG tablet Take 10 mg by mouth daily. 09/28/19   [provider]  SUMAtriptan (IMITREX) 50 MG tablet Take 50 mg by mouth as needed for migraine. 09/23/19   [provider]  tiZANidine (ZANAFLEX) 2 MG tablet Take 2 mg by mouth 2 (two) times daily as needed. 11/16/21   [provider]  traZODone (DESYREL) 100 MG tablet Take 2 tablets (200 mg total) by mouth at bedtime as needed for sleep. 08/03/22 11/01/22  [name], MD  venlafaxine XR (EFFEXOR-XR) 150 MG 24 hr capsule Take 1 capsule (150 mg total) by mouth daily. Total of 225 mg daily. Take along with 75 mg cap 08/05/22 11/03/22  [name], MD  venlafaxine XR (EFFEXOR-XR) 75 MG 24 hr capsule Take 1 capsule (75 mg total) by mouth daily with breakfast. Total of 225 mg daily. Take along with 150 mg cap 08/05/22 11/03/22  [name], MD  Vitamin D, Ergocalciferol, (DRISDOL) 1.25 MG (50000 UT) CAPS capsule Take 50,000 Units by mouth every 7 (seven) days.    [provider]  Allergies    Bupropion    Review of Systems   Review of Systems  Musculoskeletal:  Positive for myalgias.  All other systems reviewed and are negative.   Physical Exam Updated Vital Signs BP (!) 142/87   Pulse 70   Temp 98.1 F (36.7 C)   Resp 18   Wt 94.3 kg   LMP 01/14/2021 Comment: No period in over a year  SpO2 100%   BMI 32.56 kg/m  Physical Exam Vitals and nursing note reviewed.  Constitutional:      General: She is not in acute distress.    Appearance: Normal appearance. She is normal weight. She is not ill-appearing.  HENT:     Head: Normocephalic and atraumatic.  Cardiovascular:     Pulses: Normal pulses.          Dorsalis pedis pulses are 2+ on the right side and 2+ on the left  side.       Posterior tibial pulses are 2+ on the right side and 2+ on the left side.  Pulmonary:     Effort: Pulmonary effort is normal. No respiratory distress.  Abdominal:     General: Abdomen is flat.  Musculoskeletal:        General: Tenderness (Right fifth toe) and signs of injury present. No swelling or deformity. Normal range of motion.     Cervical back: Neck supple.     Comments: Right fifth toe injury.  Patient does have full range of motion of this toe despite pain.  Sensation intact.  Cap refill normal.  Skin:    General: Skin is warm and dry.     Capillary Refill: Capillary refill takes less than 2 seconds.  Neurological:     Mental Status: She is alert and oriented to person, place, and time.  Psychiatric:        Mood and Affect: Mood normal.        Behavior: Behavior normal.     ED Results / Procedures / Treatments   Labs (all labs ordered are listed, but only abnormal results are displayed) Labs Reviewed - No data to display  EKG None  Radiology No results found.  Procedures Procedures    Medications Ordered in ED Medications  ibuprofen (ADVIL) tablet 800 mg (has no administration in time range)    ED Course/ Medical Decision Making/ A&P                           Medical Decision Making Amount and/or Complexity of Data Reviewed Radiology: ordered.  Risk Prescription drug management.  This patient presents to the ED for concern of toe pain, this involves an extensive number of treatment options The differential diagnosis includes fracture, sprain, contusion  My initial workup includes X ray, Motrin  Additional history obtained from: Nursing notes from this visit.  I ordered imaging studies including X ray right 5th toe I independently visualized and interpreted imaging which showed no definitive acute fracture I agree with the radiologist interpretation  Afebrile, hemodynamically stable.  51 year old female presenting to ED for evaluation  of right fifth toe pain.  Patient stubbed her toe on her door this morning.  Is able to ambulate, states it is painful.  Took Tylenol with mild relief.  Physical exam significant for mild swelling of the right fifth toe.  Range of motion full.  Neurovascular status intact.  X-ray negative for acute fracture, does show some lucency.  Sports medicine follow-up was  offered, but patient states she will not follow-up because she does not have insurance.  Patient was educated on appropriate Tylenol and ibuprofen dosing.  She was given a postop shoe.  She was also offered crutches but declined.  Patient was given return precautions.  Stable at discharge  At this time there does not appear to be any evidence of an acute emergency medical condition and the patient appears stable for discharge with appropriate outpatient follow up. Diagnosis was discussed with patient who verbalizes understanding of care plan and is agreeable to discharge. I have discussed return precautions with patient who verbalizes understanding. Patient encouraged to follow-up with their PCP within 1 week. All questions answered.  Patient's case discussed with Dr. Renaye Rakers who agrees with plan to discharge with follow-up.   Note: Portions of this report may have been transcribed using voice recognition software. Every effort was made to ensure accuracy; however, inadvertent computerized transcription errors may still be present.          Final Clinical Impression(s) / ED Diagnoses Final diagnoses:  None    Rx / DC Orders ED Discharge Orders     None         Michelle Piper, Cordelia Poche 08/05/22 1740    Terald Sleeper, MD 08/05/22 2317

## 2022-08-05 NOTE — Discharge Instructions (Signed)
You have been seen today for your complaint of toe pain. Your imaging showed no obvious fractures. Your discharge medications include ibuprofen and Tylenol.  You may alternate these every 4 hours for pain.  You should follow dosing instructions on the bottle. Home care instructions are as follows:  Rest.  Do not walk we do not need to.  Elevate the foot above the level of your heart when resting.  Ice the foot 15 minutes at a time multiple times throughout the day.  Wear the postop shoe when walking. Follow up with: Your primary care provider in 1 week Please seek immediate medical care if you develop any of the following symptoms: You have severe pain. Your foot or toes become numb. Your foot or toes become pale or cold. You cannot move your foot or ankle. Your foot is warm to the touch. At this time there does not appear to be the presence of an emergent medical condition, however there is always the potential for conditions to change. Please read and follow the below instructions.  Do not take your medicine if  develop an itchy rash, swelling in your mouth or lips, or difficulty breathing; call 911 and seek immediate emergency medical attention if this occurs.  You may review your lab tests and imaging results in their entirety on your MyChart account.  Please discuss all results of fully with your primary care provider and other specialist at your follow-up visit.  Note: Portions of this text may have been transcribed using voice recognition software. Every effort was made to ensure accuracy; however, inadvertent computerized transcription errors may still be present.
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
## 2022-08-26 ENCOUNTER — Ambulatory Visit: Payer: Medicaid Other | Admitting: Psychiatry
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
## 2022-09-02 ENCOUNTER — Encounter: Payer: Self-pay | Admitting: Psychiatry

## 2022-09-02 ENCOUNTER — Telehealth (INDEPENDENT_AMBULATORY_CARE_PROVIDER_SITE_OTHER): Payer: Medicaid Other | Admitting: Psychiatry

## 2022-09-02 DIAGNOSIS — F419 Anxiety disorder, unspecified: Secondary | ICD-10-CM | POA: Diagnosis not present

## 2022-09-02 DIAGNOSIS — F330 Major depressive disorder, recurrent, mild: Secondary | ICD-10-CM

## 2022-09-02 MED ORDER — CLONAZEPAM 0.5 MG PO TABS: 0.2500 mg | ORAL_TABLET | Freq: Two times a day (BID) | ORAL | 1 refills | Status: DC | PRN

## 2022-09-14 ENCOUNTER — Other Ambulatory Visit: Payer: Self-pay | Admitting: Psychiatry

## 2022-09-30 ENCOUNTER — Other Ambulatory Visit: Payer: Self-pay | Admitting: Psychiatry

## 2022-10-03 ENCOUNTER — Ambulatory Visit (HOSPITAL_COMMUNITY)
Admission: RE | Admit: 2022-10-03 | Discharge: 2022-10-03 | Disposition: A | Discharge status: Home / Self Care | Payer: Medicaid Other | Source: Ambulatory Visit | Attending: Internal Medicine | Admitting: Internal Medicine

## 2022-10-03 ENCOUNTER — Encounter (HOSPITAL_COMMUNITY): Payer: Self-pay

## 2022-10-03 DIAGNOSIS — R928 Other abnormal and inconclusive findings on diagnostic imaging of breast: Secondary | ICD-10-CM

## 2022-10-07 ENCOUNTER — Other Ambulatory Visit: Payer: Self-pay | Admitting: Psychiatry

## 2022-10-08 ENCOUNTER — Other Ambulatory Visit: Payer: Self-pay | Admitting: Psychiatry

## 2022-10-08 ENCOUNTER — Telehealth: Payer: Self-pay

## 2022-10-08 ENCOUNTER — Ambulatory Visit (INDEPENDENT_AMBULATORY_CARE_PROVIDER_SITE_OTHER): Payer: Medicaid Other | Admitting: Psychiatry

## 2022-10-08 DIAGNOSIS — F331 Major depressive disorder, recurrent, moderate: Secondary | ICD-10-CM

## 2022-10-08 DIAGNOSIS — F411 Generalized anxiety disorder: Secondary | ICD-10-CM | POA: Diagnosis not present

## 2022-10-08 MED ORDER — NORTRIPTYLINE HCL 25 MG PO CAPS: 25.0000 mg | ORAL_CAPSULE | Freq: Every day | ORAL | 0 refills | Status: DC

## 2022-10-08 NOTE — Telephone Encounter (Signed)
Ordered.   Discussed with the pharmacy- she filled on 9/22 for 30 days, and she has not filled the medication since then. The order sent on 9/26 was discontinued for unknown reason per the pharmacist.

## 2022-10-08 NOTE — Telephone Encounter (Signed)
Called patient to make aware of the message no answer left voicemail for patient to return call to the office
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
## 2022-10-08 NOTE — Telephone Encounter (Signed)
Patient called requesting a refill for the following medication spoke to Melody at [facility] she stated that the last fill date was 06/07/22  Last visit 06/11/22 Next visit 10/29/22    Disp Refills Start End   nortriptyline (PAMELOR) 25 MG capsule 90 capsule 0 08/03/2022 11/01/2022   Sig - Route: Take 1 capsule (25 mg total) by mouth at bedtime. - Oral   Sent to pharmacy as: nortriptyline (PAMELOR) 25 MG capsule   Notes to Pharmacy: To be started in Nov    Patient preferred pharmacy [place], [place] - [address] [place] Phone: [phone]  Fax: [phone]

## 2022-10-09 ENCOUNTER — Telehealth: Payer: Self-pay

## 2022-10-09 NOTE — Telephone Encounter (Signed)
Do you mean that she wants to use [facility] pharmacy? If that is the case, please advise her to transfer the order from [place]- the order/nortriptyline was sent in yesterday.

## 2022-10-09 NOTE — Telephone Encounter (Signed)
Spoke to patient she stated that she had to switch to [place] due to not having any insurance she also stated that the last time she filled it there was on 07/06/22 a 90 day supply.

## 2022-10-09 NOTE — Telephone Encounter (Signed)
She did mention that [facility] called to inform her that she has a refill ready for pickup I advised the patient to call the pharmacy to make sure before it is sent to a different pharmacy

## 2022-10-09 NOTE — Telephone Encounter (Signed)
Patient called back to make me aware that she was able to pick up the Nortriptyline at [facility]
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
## 2022-10-29 ENCOUNTER — Encounter: Payer: Self-pay | Admitting: Psychiatry

## 2022-10-29 ENCOUNTER — Ambulatory Visit (INDEPENDENT_AMBULATORY_CARE_PROVIDER_SITE_OTHER): Payer: Medicaid Other | Admitting: Psychiatry

## 2022-10-29 VITALS — BP 138/79 | HR 80 | Temp 98.3°F | Ht 67.0 in | Wt 217.0 lb

## 2022-10-29 DIAGNOSIS — F330 Major depressive disorder, recurrent, mild: Secondary | ICD-10-CM | POA: Diagnosis not present

## 2022-10-29 DIAGNOSIS — F419 Anxiety disorder, unspecified: Secondary | ICD-10-CM

## 2022-10-29 DIAGNOSIS — G4700 Insomnia, unspecified: Secondary | ICD-10-CM | POA: Diagnosis not present

## 2022-10-29 MED ORDER — TRAZODONE HCL 100 MG PO TABS: 200.0000 mg | ORAL_TABLET | Freq: Every evening | ORAL | 0 refills | Status: DC | PRN

## 2022-10-29 MED ORDER — BUSPIRONE HCL 10 MG PO TABS: 20.0000 mg | ORAL_TABLET | Freq: Three times a day (TID) | ORAL | 1 refills | Status: DC

## 2022-10-29 MED ORDER — CLONAZEPAM 0.5 MG PO TABS: 0.2500 mg | ORAL_TABLET | Freq: Two times a day (BID) | ORAL | 0 refills | Status: DC | PRN

## 2022-10-29 MED ORDER — VENLAFAXINE HCL ER 150 MG PO CP24: 150.0000 mg | ORAL_CAPSULE | Freq: Every day | ORAL | 1 refills | Status: DC

## 2022-10-29 NOTE — Patient Instructions (Signed)
Restart venlafaxine 225 mg daily   Continue nortriptyline 25 mg at night Continue Buspar 20 mg three times a day  Continue Trazodone 200 mg at night as needed for sleep Continue clonazepam 0.25 mg twice a day  Next appointment: 4/3 at 11:30

## 2022-11-08 ENCOUNTER — Other Ambulatory Visit: Payer: Self-pay | Admitting: Psychiatry

## 2022-11-14 ENCOUNTER — Encounter (HOSPITAL_COMMUNITY): Payer: Self-pay

## 2022-11-14 ENCOUNTER — Ambulatory Visit (INDEPENDENT_AMBULATORY_CARE_PROVIDER_SITE_OTHER): Payer: Medicaid Other | Admitting: Psychiatry

## 2022-11-14 DIAGNOSIS — F411 Generalized anxiety disorder: Secondary | ICD-10-CM

## 2022-11-14 DIAGNOSIS — F330 Major depressive disorder, recurrent, mild: Secondary | ICD-10-CM | POA: Diagnosis not present
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
## 2022-11-28 ENCOUNTER — Ambulatory Visit (HOSPITAL_COMMUNITY): Payer: Medicaid Other | Admitting: Psychiatry

## 2022-11-29 ENCOUNTER — Encounter: Payer: Self-pay | Admitting: Obstetrics & Gynecology

## 2022-11-29 ENCOUNTER — Ambulatory Visit (INDEPENDENT_AMBULATORY_CARE_PROVIDER_SITE_OTHER): Payer: Medicaid Other | Admitting: Obstetrics & Gynecology

## 2022-11-29 VITALS — BP 125/79 | HR 78 | Ht 67.0 in | Wt 213.0 lb

## 2022-11-29 DIAGNOSIS — N951 Menopausal and female climacteric states: Secondary | ICD-10-CM | POA: Diagnosis not present

## 2022-11-29 DIAGNOSIS — R6882 Decreased libido: Secondary | ICD-10-CM

## 2022-11-29 MED ORDER — CONJ ESTROGENS-BAZEDOXIFENE 0.45-20 MG PO TABS: 1.0000 | ORAL_TABLET | Freq: Every day | ORAL | 11 refills | Status: DC

## 2022-11-29 NOTE — Progress Notes (Signed)
Chief Complaint  Patient presents with   no sex drive, hot flashes      52 y.o. [id number] Patient's last menstrual period was 01/14/2021. The current method of family planning is tubal ligation and endometrial ablation.  Outpatient Encounter Medications as of 11/29/2022  Medication Sig   busPIRone (BUSPAR) 10 MG tablet Take 2 tablets (20 mg total) by mouth 3 (three) times daily.   clonazePAM (KLONOPIN) 0.5 MG tablet Take 0.5 tablets (0.25 mg total) by mouth 2 (two) times daily as needed for anxiety.   Conj Estrogens-Bazedoxifene 0.45-20 MG TABS Take 1 tablet by mouth daily. Take 1 tablet daily   gabapentin (NEURONTIN) 300 MG capsule Take 1 capsule (300 mg total) by mouth 3 (three) times daily.   ibuprofen (MOTRIN IB) 200 MG tablet Take 200 mg by mouth every 6 (six) hours as needed for mild pain or headache.   metoprolol tartrate (LOPRESSOR) 25 MG tablet Take 25 mg by mouth 2 (two) times daily.    nortriptyline (PAMELOR) 25 MG capsule Take 1 capsule (25 mg total) by mouth at bedtime.   oxybutynin (DITROPAN) 5 MG tablet Take 5 mg by mouth daily as needed for bladder spasms.   pantoprazole (PROTONIX) 40 MG tablet Take 40 mg by mouth daily.   rosuvastatin (CRESTOR) 10 MG tablet Take 10 mg by mouth daily.   SUMAtriptan (IMITREX) 50 MG tablet Take 50 mg by mouth as needed for migraine.   tiZANidine (ZANAFLEX) 2 MG tablet Take 2 mg by mouth 2 (two) times daily as needed.   traZODone (DESYREL) 100 MG tablet Take 2 tablets (200 mg total) by mouth at bedtime as needed for sleep.   venlafaxine XR (EFFEXOR-XR) 150 MG 24 hr capsule Take 1 capsule (150 mg total) by mouth daily. Total of 225 mg daily. Take along with 75 mg cap   Vitamin D, Ergocalciferol, (DRISDOL) 1.25 MG (50000 UT) CAPS capsule Take 50,000 Units by mouth every 7 (seven) days.   venlafaxine XR (EFFEXOR-XR) 75 MG 24 hr capsule Take 1 capsule (75 mg total) by mouth daily with breakfast. Total of 225 mg daily. Take along with 150 mg  cap   [DISCONTINUED] hydrOXYzine (VISTARIL) 50 MG capsule Take 100 mg by mouth 2 (two) times daily. (Patient not taking: Reported on 11/29/2022)   No facility-administered encounter medications on file as of 11/29/2022.    Subjective Pt with decreased sex drive for the last couple of years On a number of psychoactive meds which have a predictable negative impact on sex drive Also with increased night sweats, hot flashes during the day-->vasomotor symptoms Discussed options and would like to at least try HRT Understands may be related to her psyche meds  Past Medical History:  Diagnosis Date   Anxiety    Arthritis    spine   Bicornate uterus    Chronic back pain    started about 6 years ago - four bulging discs   Depression    Essential hypertension 08/22/2017    Past Surgical History:  Procedure Laterality Date   BREAST BIOPSY Left 01/31/2016   benign   BREAST BIOPSY Left 08/17/2013   benign   CESAREAN SECTION     3   DILATATION AND CURETTAGE/HYSTEROSCOPY WITH MINERVA N/A 02/21/2021   Procedure: DILATATION AND CURETTAGE/HYSTEROSCOPY WITH MINERVA;  Surgeon: Florian Buff, MD;  Location: [facility];  Service: Gynecology;  Laterality: N/A;   TUBAL LIGATION      OB History  Gravida  5   Para  2   Term  2   Preterm      AB  3   Living  2      SAB      IAB  3   Ectopic      Multiple      Live Births  2        Obstetric Comments  Cesarean section x 2, due to bicornuate uterus and fetal malpresntaton BREECH, THEN REPEAT         Allergies  Allergen Reactions   Bupropion Other (See Comments)    Worsening in SI, anxiety    Social History   Socioeconomic History   Marital status: Single    Spouse name: Not on file   Number of children: 2   Years of education: 12   Highest education level: Not on file  Occupational History   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  Tobacco Use   Smoking status: Every Day    Packs/day: 0.50    Years: 23.00     Additional pack years: 0.00    Total pack years: 11.50    Types: Cigarettes    Start date: 09/17/1995   Smokeless tobacco: Never   Tobacco comments:    trying to quit  Vaping Use   Vaping Use: Never used  Substance and Sexual Activity   Alcohol use: No   Drug use: Yes    Types: Marijuana    Comment: 2 joints per week   Sexual activity: Not Currently    Birth control/protection: Surgical    Comment: tubal  Other Topics Concern   Not on file  Social History Narrative   Single   Lives with [name] [place] [employer]   Feels unable to work due to back pain   Social Determinants of Health   Financial Resource Strain: Not on file  Food Insecurity: Not on file  Transportation Needs: Not on file  Physical Activity: Not on file  Stress: Not on file  Social Connections: Not on file    Family History  Problem Relation Age of Onset   Hypertension Mother    Depression Mother    Hyperlipidemia Mother    Heart disease Mother    Heart attack Mother    Cancer Father        pancreatic   Mental illness Sister        schizoid   Diabetes Sister    Schizophrenia Sister    Cancer Maternal Grandmother        myleodysplastic syndrome   Depression Maternal Grandmother    Myelodysplastic syndrome Maternal Grandmother    Diabetes Maternal Grandmother    Hypertension Maternal Grandmother    Alcohol abuse Maternal Grandfather     Medications:       Current Outpatient Medications:    busPIRone (BUSPAR) 10 MG tablet, Take 2 tablets (20 mg total) by mouth 3 (three) times daily., Disp: 540 tablet, Rfl: 1   clonazePAM (KLONOPIN) 0.5 MG tablet, Take 0.5 tablets (0.25 mg total) by mouth 2 (two) times daily as needed for anxiety., Disp: 30 tablet, Rfl: 0   Conj Estrogens-Bazedoxifene 0.45-20 MG TABS, Take 1 tablet by mouth daily. Take 1 tablet daily, Disp: 30 tablet, Rfl: 11   gabapentin (NEURONTIN) 300 MG capsule, Take 1 capsule (300 mg total) by mouth 3 (three) times daily., Disp: 90 capsule, Rfl: 0    ibuprofen (MOTRIN IB) 200 MG tablet, Take 200 mg by mouth every  6 (six) hours as needed for mild pain or headache., Disp: , Rfl:    metoprolol tartrate (LOPRESSOR) 25 MG tablet, Take 25 mg by mouth 2 (two) times daily. , Disp: , Rfl:    nortriptyline (PAMELOR) 25 MG capsule, Take 1 capsule (25 mg total) by mouth at bedtime., Disp: 30 capsule, Rfl: 1   oxybutynin (DITROPAN) 5 MG tablet, Take 5 mg by mouth daily as needed for bladder spasms., Disp: , Rfl:    pantoprazole (PROTONIX) 40 MG tablet, Take 40 mg by mouth daily., Disp: , Rfl:    rosuvastatin (CRESTOR) 10 MG tablet, Take 10 mg by mouth daily., Disp: , Rfl:    SUMAtriptan (IMITREX) 50 MG tablet, Take 50 mg by mouth as needed for migraine., Disp: , Rfl:    tiZANidine (ZANAFLEX) 2 MG tablet, Take 2 mg by mouth 2 (two) times daily as needed., Disp: , Rfl:    traZODone (DESYREL) 100 MG tablet, Take 2 tablets (200 mg total) by mouth at bedtime as needed for sleep., Disp: 180 tablet, Rfl: 0   venlafaxine XR (EFFEXOR-XR) 150 MG 24 hr capsule, Take 1 capsule (150 mg total) by mouth daily. Total of 225 mg daily. Take along with 75 mg cap, Disp: 90 capsule, Rfl: 1   Vitamin D, Ergocalciferol, (DRISDOL) 1.25 MG (50000 UT) CAPS capsule, Take 50,000 Units by mouth every 7 (seven) days., Disp: , Rfl:    venlafaxine XR (EFFEXOR-XR) 75 MG 24 hr capsule, Take 1 capsule (75 mg total) by mouth daily with breakfast. Total of 225 mg daily. Take along with 150 mg cap, Disp: 90 capsule, Rfl: 0  Objective Blood pressure 125/79, pulse 78, height 5\' 7"  (1.702 m), weight 213 lb (96.6 kg), last menstrual period 01/14/2021.  Gen WDWN NAD  Pertinent ROS No burning with urination, frequency or urgency No nausea, vomiting or diarrhea Nor fever chills or other constitutional symptoms   Labs or studies No new    Impression + Management Plan: Diagnoses this Encounter::   ICD-10-CM   1. Decreased libido  R68.82    on a number of meds whihc would have negative  impact, but also menopausal    2. Vasomotor symptoms due to menopause  N95.1    trial of duavee        Medications prescribed during  this encounter: Meds ordered this encounter  Medications   Conj Estrogens-Bazedoxifene 0.45-20 MG TABS    Sig: Take 1 tablet by mouth daily. Take 1 tablet daily    Dispense:  30 tablet    Refill:  11    Labs or Scans Ordered during this encounter: No orders of the defined types were placed in this encounter.     Follow up Return in about 3 months (around 03/01/2023) for Follow up, with Dr Elonda Husky.

## 2022-12-03 ENCOUNTER — Telehealth: Payer: Self-pay | Admitting: *Deleted

## 2022-12-03 NOTE — Telephone Encounter (Signed)
Tiffany Morgan is non preferred with patients insurance. They prefer that she try Climara Pro Patch, Combi patch, estradiol patch, estradiol tablet, evamist spray, menest tab, or premarin tablet.

## 2022-12-05 ENCOUNTER — Telehealth: Payer: Self-pay

## 2022-12-05 NOTE — Telephone Encounter (Signed)
Patient called and stated that she was in last week and Dr. Elonda Husky prescribed her Conj estrogen and Medicaid will not pay for it.  Patient was wondering if she could get something else.

## 2022-12-09 ENCOUNTER — Encounter: Payer: Self-pay | Admitting: Obstetrics & Gynecology

## 2022-12-09 MED ORDER — ESTRADIOL 2 MG PO TABS: 2.0000 mg | ORAL_TABLET | Freq: Every day | ORAL | 1 refills | Status: DC

## 2022-12-12 ENCOUNTER — Ambulatory Visit (INDEPENDENT_AMBULATORY_CARE_PROVIDER_SITE_OTHER): Payer: Medicaid Other | Admitting: Psychiatry

## 2022-12-12 DIAGNOSIS — F330 Major depressive disorder, recurrent, mild: Secondary | ICD-10-CM

## 2022-12-12 DIAGNOSIS — F411 Generalized anxiety disorder: Secondary | ICD-10-CM
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
## 2022-12-18 ENCOUNTER — Encounter: Payer: Self-pay | Admitting: Psychiatry

## 2022-12-18 ENCOUNTER — Telehealth (INDEPENDENT_AMBULATORY_CARE_PROVIDER_SITE_OTHER): Payer: Medicaid Other | Admitting: Psychiatry

## 2022-12-18 DIAGNOSIS — G4700 Insomnia, unspecified: Secondary | ICD-10-CM

## 2022-12-18 DIAGNOSIS — F411 Generalized anxiety disorder: Secondary | ICD-10-CM

## 2022-12-18 DIAGNOSIS — F330 Major depressive disorder, recurrent, mild: Secondary | ICD-10-CM | POA: Diagnosis not present

## 2022-12-18 NOTE — Patient Instructions (Addendum)
Continue venlafaxine 225 mg daily  (she was not taking 75 mg cap- she will contact the clinic if she needs a refill) Continue nortriptyline 25 mg at night Continue Buspar 20 mg three times a day - Continue Trazodone 200 mg at night as needed for sleep Continue clonazepam 0.25 mg twice a day (reduced from 0.5 mg BID) Next appointment: 5/8 at 10 AM

## 2022-12-26 ENCOUNTER — Ambulatory Visit: Payer: Medicaid Other | Admitting: Obstetrics & Gynecology

## 2022-12-28 ENCOUNTER — Other Ambulatory Visit: Payer: Self-pay | Admitting: Psychiatry

## 2023-01-06 ENCOUNTER — Other Ambulatory Visit: Payer: Self-pay | Admitting: Psychiatry

## 2023-01-09 ENCOUNTER — Ambulatory Visit (INDEPENDENT_AMBULATORY_CARE_PROVIDER_SITE_OTHER): Payer: Medicaid Other | Admitting: Psychiatry

## 2023-01-09 DIAGNOSIS — F330 Major depressive disorder, recurrent, mild: Secondary | ICD-10-CM

## 2023-01-09 DIAGNOSIS — F411 Generalized anxiety disorder: Secondary | ICD-10-CM | POA: Diagnosis not present
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
## 2023-01-22 ENCOUNTER — Encounter: Payer: Self-pay | Admitting: Psychiatry

## 2023-01-22 ENCOUNTER — Telehealth (INDEPENDENT_AMBULATORY_CARE_PROVIDER_SITE_OTHER): Payer: Medicaid Other | Admitting: Psychiatry

## 2023-01-22 DIAGNOSIS — F411 Generalized anxiety disorder: Secondary | ICD-10-CM | POA: Diagnosis not present

## 2023-01-22 DIAGNOSIS — F330 Major depressive disorder, recurrent, mild: Secondary | ICD-10-CM | POA: Diagnosis not present

## 2023-01-22 DIAGNOSIS — G4700 Insomnia, unspecified: Secondary | ICD-10-CM | POA: Diagnosis not present

## 2023-01-22 MED ORDER — TRAZODONE HCL 100 MG PO TABS: 200.0000 mg | ORAL_TABLET | Freq: Every evening | ORAL | 1 refills | Status: DC | PRN

## 2023-01-22 MED ORDER — CLONAZEPAM 0.5 MG PO TABS: 0.2500 mg | ORAL_TABLET | Freq: Two times a day (BID) | ORAL | 1 refills | Status: DC

## 2023-01-22 MED ORDER — VENLAFAXINE HCL ER 75 MG PO CP24: 75.0000 mg | ORAL_CAPSULE | Freq: Every day | ORAL | 1 refills | Status: DC

## 2023-01-22 NOTE — Patient Instructions (Signed)
Continue venlafaxine 225 mg daily  Continue nortriptyline 25 mg at night Continue Buspar 20 mg three times a day  Continue Trazodone 200 mg at night as needed for sleep Continue clonazepam 0.25 mg twice a day  Next appointment: 6/21 at 9 30 AM

## 2023-01-23 ENCOUNTER — Ambulatory Visit (INDEPENDENT_AMBULATORY_CARE_PROVIDER_SITE_OTHER): Payer: Medicaid Other | Admitting: Psychiatry

## 2023-01-23 DIAGNOSIS — F330 Major depressive disorder, recurrent, mild: Secondary | ICD-10-CM | POA: Diagnosis not present
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
## 2023-02-06 ENCOUNTER — Other Ambulatory Visit: Payer: Self-pay | Admitting: Psychiatry

## 2023-02-08 ENCOUNTER — Other Ambulatory Visit: Payer: Self-pay | Admitting: Psychiatry

## 2023-02-08 ENCOUNTER — Other Ambulatory Visit: Payer: Self-pay | Admitting: Obstetrics & Gynecology

## 2023-02-13 ENCOUNTER — Encounter: Payer: Self-pay | Admitting: Obstetrics & Gynecology

## 2023-02-13 ENCOUNTER — Ambulatory Visit (INDEPENDENT_AMBULATORY_CARE_PROVIDER_SITE_OTHER): Payer: Medicaid Other | Admitting: Obstetrics & Gynecology

## 2023-02-13 ENCOUNTER — Other Ambulatory Visit (HOSPITAL_COMMUNITY)
Admission: RE | Admit: 2023-02-13 | Discharge: 2023-02-13 | Disposition: A | Discharge status: Home / Self Care | Payer: Medicaid Other | Source: Ambulatory Visit | Attending: Obstetrics & Gynecology | Admitting: Obstetrics & Gynecology

## 2023-02-13 VITALS — BP 130/86 | HR 68 | Ht 67.0 in | Wt 213.0 lb

## 2023-02-13 DIAGNOSIS — Z01419 Encounter for gynecological examination (general) (routine) without abnormal findings: Secondary | ICD-10-CM | POA: Diagnosis present

## 2023-02-13 DIAGNOSIS — Z1211 Encounter for screening for malignant neoplasm of colon: Secondary | ICD-10-CM | POA: Diagnosis not present

## 2023-02-13 DIAGNOSIS — N951 Menopausal and female climacteric states: Secondary | ICD-10-CM

## 2023-02-13 DIAGNOSIS — R6882 Decreased libido: Secondary | ICD-10-CM | POA: Diagnosis not present

## 2023-02-13 MED ORDER — ESTRADIOL 2 MG PO TABS: 2.0000 mg | ORAL_TABLET | Freq: Every day | ORAL | 4 refills | Status: DC

## 2023-02-13 NOTE — Progress Notes (Signed)
WELL-WOMAN EXAMINATION Patient name: Tiffany Morgan MRN [id number]  Date of birth: January 06, 1971 Chief Complaint:   Gynecologic Exam  History of Present Illness:   Tiffany Morgan is a 52 y.o. [phone] female being seen today for a routine well-woman exam.   HRT: Doing well with current medication.  She rates her hot flashes as 0 out of 10  Denies vaginal bleeding, discharge, itching, or irritation.  Denies pelvic or abdominal pain.  Denies urinary concerns.  Her only complaint is decreased libido however she reports this is not an issue for her.   Patient's last menstrual period was 01/14/2021.  Last pap collected today.  Last mammogram: 09/2022. Last colonoscopy: difficulty with drinking solution colonoscopy     02/13/2023    2:14 PM 12/12/2022    3:11 PM 11/14/2022    4:05 PM 10/29/2022    3:00 PM 10/08/2022    3:18 PM  Depression screen PHQ 2/9  Decreased Interest 1      Down, Depressed, Hopeless 1      PHQ - 2 Score 2      Altered sleeping 2      Tired, decreased energy 2      Change in appetite 2      Feeling bad or failure about yourself  1      Trouble concentrating 1      Moving slowly or fidgety/restless 0      Suicidal thoughts 0      PHQ-9 Score 10      Difficult doing work/chores          Information is confidential and restricted. Go to Review Flowsheets to unlock data.      Review of Systems:   Pertinent items are noted in HPI Denies any headaches, blurred vision, fatigue, shortness of breath, chest pain, abdominal pain, bowel movements, urination, or intercourse unless otherwise stated above.  Pertinent History Reviewed:  Reviewed past medical,surgical, social and family history.  Reviewed problem list, medications and allergies. Physical Assessment:   Vitals:   02/13/23 1413  BP: 130/86  Pulse: 68  Weight: 213 lb (96.6 kg)  Height: 5\' 7"  (1.702 m)  Body mass index is 33.36 kg/m.        Physical Examination:   General appearance - well  appearing, and in no distress  Mental status - alert, oriented to person, place, and time  Psych:  She has a normal mood and affect  Skin - warm and dry, normal color, no suspicious lesions noted  Chest - effort normal, all lung fields clear to auscultation bilaterally  Heart - normal rate and regular rhythm  Neck:  midline trachea, no thyromegaly or nodules  Breasts - breasts appear normal, no suspicious masses, no skin or nipple changes or  axillary nodes  Abdomen - soft, nontender, nondistended, no masses or organomegaly  Pelvic - VULVA: normal appearing vulva with no masses, tenderness or lesions  VAGINA: normal appearing vagina with normal color and discharge, no lesions  CERVIX: normal appearing cervix without discharge or lesions, no CMT  Thin prep pap is done with HR HPV cotesting  UTERUS: uterus is felt to be normal size, shape, consistency and nontender   ADNEXA: No adnexal masses or tenderness noted.  Extremities:  No swelling or varicosities noted  Chaperone: Faith Rogue     Assessment & Plan:  1) Well-Woman Exam -Pap collected, discussed ASCCP guidelines -Mammogram up-to-date -Cologuard ordered as potential alternative to colonoscopy  2) HRT -Discussed "lowest  dose for shortest course " -Patient had missed a few days and by day 3 noted considerable symptoms.  -She plans to try half tab daily  Orders Placed This Encounter  Procedures   Cologuard    Meds:  Meds ordered this encounter  Medications   estradiol (ESTRACE) 2 MG tablet    Sig: Take 1 tablet (2 mg total) by mouth daily.    Dispense:  90 tablet    Refill:  4    Follow-up: Return in about 1 year (around 02/13/2024) for Annual.   Myna Hidalgo, DO Attending Obstetrician & Gynecologist, [facility] for [facility], [facility]

## 2023-02-19 LAB — CYTOLOGY - PAP
Chlamydia: NEGATIVE
Comment: NEGATIVE
Comment: NEGATIVE
Comment: NEGATIVE
Comment: NEGATIVE
Comment: NORMAL
Diagnosis: UNDETERMINED — AB
HPV 16: NEGATIVE
HPV 18 / 45: NEGATIVE
High risk HPV: POSITIVE — AB
Neisseria Gonorrhea: NEGATIVE

## 2023-02-24 ENCOUNTER — Ambulatory Visit (INDEPENDENT_AMBULATORY_CARE_PROVIDER_SITE_OTHER): Payer: Medicaid Other | Admitting: Obstetrics & Gynecology

## 2023-02-24 ENCOUNTER — Encounter: Payer: Self-pay | Admitting: Obstetrics & Gynecology

## 2023-02-24 ENCOUNTER — Other Ambulatory Visit (HOSPITAL_COMMUNITY)
Admission: RE | Admit: 2023-02-24 | Discharge: 2023-02-24 | Disposition: A | Discharge status: Home / Self Care | Payer: Medicaid Other | Source: Ambulatory Visit | Attending: Obstetrics & Gynecology | Admitting: Obstetrics & Gynecology

## 2023-02-24 VITALS — BP 138/79 | HR 71 | Ht 67.0 in | Wt 213.8 lb

## 2023-02-24 DIAGNOSIS — R87610 Atypical squamous cells of undetermined significance on cytologic smear of cervix (ASC-US): Secondary | ICD-10-CM | POA: Insufficient documentation

## 2023-02-24 DIAGNOSIS — R87810 Cervical high risk human papillomavirus (HPV) DNA test positive: Secondary | ICD-10-CM | POA: Insufficient documentation

## 2023-02-24 NOTE — Progress Notes (Signed)
Patient name: Tiffany Morgan MRN [id number]  Date of birth: Mar 28, 1971 Chief Complaint:   Colposcopy  History of Present Illness:   Tiffany Morgan is a 52 y.o. N5A2130 PM female being seen today for cervical dysplasia management.  Smoker:  Yes.   New sexual partner:  No.   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     02/13/2023    2:14 PM 12/12/2022    3:11 PM 11/14/2022    4:05 PM 10/29/2022    3:00 PM 10/08/2022    3:18 PM  Depression screen PHQ 2/9  Decreased Interest 1      Down, Depressed, Hopeless 1      PHQ - 2 Score 2      Altered sleeping 2      Tired, decreased energy 2      Change in appetite 2      Feeling bad or failure about yourself  1      Trouble concentrating 1      Moving slowly or fidgety/restless 0      Suicidal thoughts 0      PHQ-9 Score 10      Difficult doing work/chores          Information is confidential and restricted. Go to Review Flowsheets to unlock data.     Review of Systems:   Pertinent items are noted in HPI Denies fever/chills, dizziness, headaches, visual disturbances, fatigue, shortness of breath, chest pain, abdominal pain, vomiting, no problems with bowel movements, urination, or intercourse unless otherwise stated above.  Pertinent History Reviewed:  Reviewed past medical,surgical, social, obstetrical and family history.  Reviewed problem list, medications and allergies. Physical Assessment:   Vitals:   02/24/23 1104  BP: 138/79  Pulse: 71  Weight: 213 lb 12.8 oz (97 kg)  Height: 5\' 7"  (1.702 m)  Body mass index is 33.49 kg/m.       Physical Examination:   General appearance: alert, well appearing, and in no distress  Psych: mood appropriate, normal affect  Skin: warm & dry   Cardiovascular: normal heart rate noted  Respiratory: normal respiratory effort, no distress  Abdomen: soft, non-tender   Pelvic: VULVA: normal appearing vulva with no masses, tenderness or lesions, VAGINA:  normal appearing vagina with normal color and discharge, no lesions, **Retroverted uterus- Graves speculum used CERVIX: normal appearing cervix without discharge or lesions- see colposcopy section  Extremities: no edema   Chaperone: Faith Rogue     Colposcopy Procedure Note  Indications: ASCUS, HPV+    Procedure Details  The risks and benefits of the procedure and Written informed consent obtained.  Speculum placed in vagina and excellent visualization of cervix achieved, cervix swabbed x 3 with acetic acid solution.  Findings: Adequate colposcopy is noted today.  TMZ zone visualized  Cervix: no visible lesions, some acetowhite changes noted; ECC and cervical biopsies obtained.    Monsel's applied.  Adequate hemostasis noted  Specimens: ECC and cervical biopsies  Complications: none.  Colposcopic Impression:   Plan(Based on 2019 ASCCP recommendations)  -Discussed HPV- reviewed incidence and its potential to cause condylomas to dysplasia to cervical cancer -Reviewed degree of abnormal pap smears  -Discussed ASCCP guidelines and current recommendations for colposcopy -As above, inform consent obtained and procedure completed -biopsies obtained, further management pending results -Questions and concerns were addressed  Myna Hidalgo, DO Attending Obstetrician & Gynecologist, [facility] for [facility], [facility]

## 2023-02-25 LAB — SURGICAL PATHOLOGY

## 2023-02-26 ENCOUNTER — Ambulatory Visit (INDEPENDENT_AMBULATORY_CARE_PROVIDER_SITE_OTHER): Payer: Medicaid Other | Admitting: Psychiatry

## 2023-02-26 DIAGNOSIS — F411 Generalized anxiety disorder: Secondary | ICD-10-CM | POA: Diagnosis not present

## 2023-02-26 DIAGNOSIS — F330 Major depressive disorder, recurrent, mild: Secondary | ICD-10-CM
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
## 2023-03-03 ENCOUNTER — Ambulatory Visit: Payer: Medicaid Other | Admitting: Obstetrics & Gynecology

## 2023-03-07 ENCOUNTER — Telehealth (INDEPENDENT_AMBULATORY_CARE_PROVIDER_SITE_OTHER): Payer: Medicaid Other | Admitting: Psychiatry

## 2023-03-07 ENCOUNTER — Encounter: Payer: Self-pay | Admitting: Psychiatry

## 2023-03-07 DIAGNOSIS — F330 Major depressive disorder, recurrent, mild: Secondary | ICD-10-CM

## 2023-03-07 DIAGNOSIS — G4700 Insomnia, unspecified: Secondary | ICD-10-CM

## 2023-03-07 DIAGNOSIS — F411 Generalized anxiety disorder: Secondary | ICD-10-CM

## 2023-03-07 MED ORDER — NORTRIPTYLINE HCL 25 MG PO CAPS: 25.0000 mg | ORAL_CAPSULE | Freq: Every day | ORAL | 0 refills | Status: DC

## 2023-03-07 NOTE — Patient Instructions (Signed)
Continue venlafaxine 225 mg daily  Continue nortriptyline 25 mg at night Continue Buspar 20 mg three times a day  Continue Trazodone 200 mg at night as needed for sleep Continue clonazepam 0.25 mg twice a day  Next appointment: 8/9 at 8 30 AM

## 2023-03-12 ENCOUNTER — Ambulatory Visit (INDEPENDENT_AMBULATORY_CARE_PROVIDER_SITE_OTHER): Payer: Medicaid Other | Admitting: Psychiatry

## 2023-03-12 DIAGNOSIS — F411 Generalized anxiety disorder: Secondary | ICD-10-CM | POA: Diagnosis not present

## 2023-03-12 DIAGNOSIS — F330 Major depressive disorder, recurrent, mild: Secondary | ICD-10-CM | POA: Diagnosis not present
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
## 2023-04-08 ENCOUNTER — Other Ambulatory Visit: Payer: Self-pay | Admitting: Psychiatry

## 2023-04-11 ENCOUNTER — Other Ambulatory Visit: Payer: Self-pay | Admitting: Psychiatry

## 2023-04-11 MED ORDER — CLONAZEPAM 0.5 MG PO TABS: 0.2500 mg | ORAL_TABLET | Freq: Two times a day (BID) | ORAL | 0 refills | Status: DC
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
## 2023-04-25 ENCOUNTER — Encounter: Payer: Self-pay | Admitting: Psychiatry

## 2023-04-25 ENCOUNTER — Telehealth: Payer: Medicaid Other | Admitting: Psychiatry

## 2023-04-25 DIAGNOSIS — G4700 Insomnia, unspecified: Secondary | ICD-10-CM

## 2023-04-25 DIAGNOSIS — F411 Generalized anxiety disorder: Secondary | ICD-10-CM | POA: Diagnosis not present

## 2023-04-25 DIAGNOSIS — F330 Major depressive disorder, recurrent, mild: Secondary | ICD-10-CM

## 2023-04-25 DIAGNOSIS — Z79899 Other long term (current) drug therapy: Secondary | ICD-10-CM

## 2023-04-25 MED ORDER — CLONAZEPAM 0.5 MG PO TABS: 0.2500 mg | ORAL_TABLET | Freq: Two times a day (BID) | ORAL | 1 refills | Status: DC

## 2023-04-25 MED ORDER — BUSPIRONE HCL 10 MG PO TABS: 20.0000 mg | ORAL_TABLET | Freq: Three times a day (TID) | ORAL | 1 refills | Status: DC

## 2023-04-25 MED ORDER — NORTRIPTYLINE HCL 25 MG PO CAPS: 25.0000 mg | ORAL_CAPSULE | Freq: Every day | ORAL | 0 refills | Status: DC

## 2023-05-07 ENCOUNTER — Telehealth (HOSPITAL_COMMUNITY): Payer: Self-pay | Admitting: Psychiatry

## 2023-05-07 NOTE — Telephone Encounter (Signed)
Called and confirmed patient will be attending appointment on 08/23 with Tiffany Morgan.

## 2023-05-09 ENCOUNTER — Ambulatory Visit (HOSPITAL_COMMUNITY): Payer: Medicaid Other | Admitting: Psychiatry

## 2023-05-09 ENCOUNTER — Telehealth (HOSPITAL_COMMUNITY): Payer: Self-pay | Admitting: Psychiatry

## 2023-05-09 NOTE — Telephone Encounter (Signed)
Therapist attempted to contact patient via phone for scheduled appointment and received voicemail recording.  Therapist left message indicating attempt and requesting patient call office. 

## 2023-05-09 NOTE — Progress Notes (Unsigned)
Opened in eror

## 2023-05-12 ENCOUNTER — Other Ambulatory Visit: Payer: Self-pay | Admitting: Psychiatry

## 2023-05-12 ENCOUNTER — Encounter (HOSPITAL_COMMUNITY): Payer: Self-pay | Admitting: Emergency Medicine

## 2023-05-12 ENCOUNTER — Other Ambulatory Visit: Payer: Self-pay

## 2023-05-12 ENCOUNTER — Emergency Department (HOSPITAL_COMMUNITY)
Admission: EM | Admit: 2023-05-12 | Discharge: 2023-05-12 | Disposition: A | Discharge status: Home / Self Care | Payer: Medicaid Other | Attending: Student | Admitting: Student

## 2023-05-12 DIAGNOSIS — M545 Low back pain, unspecified: Secondary | ICD-10-CM | POA: Insufficient documentation

## 2023-05-12 DIAGNOSIS — Z1152 Encounter for screening for COVID-19: Secondary | ICD-10-CM | POA: Diagnosis not present

## 2023-05-12 DIAGNOSIS — Z79899 Other long term (current) drug therapy: Secondary | ICD-10-CM | POA: Insufficient documentation

## 2023-05-12 DIAGNOSIS — M791 Myalgia, unspecified site: Secondary | ICD-10-CM | POA: Diagnosis present

## 2023-05-12 DIAGNOSIS — G8929 Other chronic pain: Secondary | ICD-10-CM | POA: Diagnosis not present

## 2023-05-12 DIAGNOSIS — M549 Dorsalgia, unspecified: Secondary | ICD-10-CM

## 2023-05-12 LAB — URINALYSIS, ROUTINE W REFLEX MICROSCOPIC
Bacteria, UA: NONE SEEN
Bilirubin Urine: NEGATIVE
Glucose, UA: NEGATIVE mg/dL
Ketones, ur: NEGATIVE mg/dL
Leukocytes,Ua: NEGATIVE
Nitrite: NEGATIVE
Protein, ur: NEGATIVE mg/dL
Specific Gravity, Urine: 1.013 (ref 1.005–1.030)
pH: 6 (ref 5.0–8.0)

## 2023-05-12 LAB — BASIC METABOLIC PANEL
Anion gap: 9 (ref 5–15)
BUN: 7 mg/dL (ref 6–20)
CO2: 23 mmol/L (ref 22–32)
Calcium: 9 mg/dL (ref 8.9–10.3)
Chloride: 99 mmol/L (ref 98–111)
Creatinine, Ser: 0.79 mg/dL (ref 0.44–1.00)
GFR, Estimated: >60 mL/min (ref >60)
Glucose, Bld: 97 mg/dL (ref 70–99)
Potassium: 4 mmol/L (ref 3.5–5.1)
Sodium: 131 mmol/L — ABNORMAL LOW (ref 135–145)

## 2023-05-12 LAB — CBC
HCT: 41.8 % (ref 36.0–46.0)
Hemoglobin: 14 g/dL (ref 12.0–15.0)
MCH: 30.4 pg (ref 26.0–34.0)
MCHC: 33.5 g/dL (ref 30.0–36.0)
MCV: 90.7 fL (ref 80.0–100.0)
Platelets: 311 10*3/uL (ref 150–400)
RBC: 4.61 MIL/uL (ref 3.87–5.11)
RDW: 15.5 % (ref 11.5–15.5)
WBC: 10 10*3/uL (ref 4.0–10.5)
nRBC: 0 % (ref 0.0–0.2)

## 2023-05-12 LAB — SARS CORONAVIRUS 2 BY RT PCR: SARS Coronavirus 2 by RT PCR: NEGATIVE

## 2023-05-12 MED ORDER — METHOCARBAMOL 500 MG PO TABS: 500.0000 mg | ORAL_TABLET | Freq: Two times a day (BID) | ORAL | 0 refills | Status: DC

## 2023-05-12 MED ORDER — KETOROLAC TROMETHAMINE 15 MG/ML IJ SOLN
15.0000 mg | Freq: Once | INTRAMUSCULAR | Status: AC
  Administered 2023-05-12: 15 mg via INTRAMUSCULAR
  Filled 2023-05-12: qty 1

## 2023-05-12 MED ORDER — PREDNISONE 10 MG (21) PO TBPK: ORAL_TABLET | Freq: Every day | ORAL | 0 refills | Status: DC

## 2023-05-12 MED ORDER — METHYLPREDNISOLONE ACETATE 80 MG/ML IJ SUSP
80.0000 mg | Freq: Once | INTRAMUSCULAR | Status: AC
  Administered 2023-05-12: 80 mg via INTRAMUSCULAR
  Filled 2023-05-12: qty 1

## 2023-05-12 NOTE — Discharge Instructions (Signed)
Prednisone as prescribed, complete the full course.  Consider STOPPING the Tizanidine and change to Robaxin. Do NOT not take both medications.    Recommend warm compresses for 20 minutes at a time.    Recheck with your primary care provider.  Return to the ER for severe or concerning symptoms.

## 2023-05-12 NOTE — ED Triage Notes (Signed)
Pt reports waking up with body aches yesterday and lower back pain. Endorses nausea and feeling cold when normally hot. Pt took muscle relaxer this am with no relief. Also was at baby shower this weekend.

## 2023-05-12 NOTE — ED Provider Notes (Signed)
[place] EMERGENCY DEPARTMENT AT [facility] Provider Note   CSN: [id number] Arrival date & time: 05/12/23  1244     History  Chief Complaint  Patient presents with   Generalized Body Aches    Tiffany Morgan is a 52 y.o. female.  52 year old female with history of chronic back pain presents with acute/worsening pain. Denies recent falls or injuries, states had XR and MRI years ago, has been taking tizanidine and gabapentin as prescribed but this is not helping her pain currently. Denies changes in bowel habits, notes single episode loss bladder control earlier, no saddle paresthesia, no leg weakness, no fevers or history of IVDA. No other complaints or concerns.        Home Medications Prior to Admission medications   Medication Sig Start Date End Date Taking? Authorizing Provider  methocarbamol (ROBAXIN) 500 MG tablet Take 1 tablet (500 mg total) by mouth 2 (two) times daily. 05/12/23  Yes Jeannie Fend, PA-C  predniSONE (STERAPRED UNI-PAK 21 TAB) 10 MG (21) TBPK tablet Take by mouth daily. Take 6 tabs by mouth daily  for 2 days, then 5 tabs for 2 days, then 4 tabs for 2 days, then 3 tabs for 2 days, 2 tabs for 2 days, then 1 tab by mouth daily for 2 days 05/12/23  Yes Jeannie Fend, PA-C  busPIRone (BUSPAR) 10 MG tablet Take 2 tablets (20 mg total) by mouth 3 (three) times daily. 05/01/23 10/28/23  [name], MD  clonazePAM (KLONOPIN) 0.5 MG tablet Take 0.5 tablets (0.25 mg total) by mouth 2 (two) times daily. 05/11/23 07/10/23  [name], MD  estradiol (ESTRACE) 2 MG tablet Take 1 tablet (2 mg total) by mouth daily. 02/13/23 05/14/23  Myna Hidalgo, DO  gabapentin (NEURONTIN) 300 MG capsule Take 1 capsule (300 mg total) by mouth 3 (three) times daily. 09/01/20   Daphine Deutscher Mary-Margaret, FNP  metoprolol tartrate (LOPRESSOR) 25 MG tablet Take 25 mg by mouth 2 (two) times daily.     [provider]  nortriptyline (PAMELOR) 25 MG capsule Take 1 capsule (25  mg total) by mouth at bedtime. 06/05/23 09/03/23  [name], MD  oxybutynin (DITROPAN) 5 MG tablet Take 5 mg by mouth daily as needed for bladder spasms. Patient not taking: Reported on 02/13/2023    [provider]  pantoprazole (PROTONIX) 40 MG tablet Take 40 mg by mouth daily. 09/20/20   [provider]  rosuvastatin (CRESTOR) 10 MG tablet Take 10 mg by mouth daily. 09/28/19   [provider]  SUMAtriptan (IMITREX) 50 MG tablet Take 50 mg by mouth as needed for migraine. 09/23/19   [provider]  traZODone (DESYREL) 100 MG tablet Take 2 tablets (200 mg total) by mouth at bedtime as needed for sleep. 01/31/23 07/30/23  [name], MD  venlafaxine XR (EFFEXOR-XR) 150 MG 24 hr capsule Take 1 capsule (150 mg total) by mouth daily. Total of 225 mg daily. Take along with 75 mg cap 05/12/23 11/08/23  [name], MD  venlafaxine XR (EFFEXOR-XR) 75 MG 24 hr capsule Take 1 capsule (75 mg total) by mouth daily with breakfast. Total of 225 mg daily. Take along with 150 mg cap 01/22/23 07/21/23  [name], MD  Vitamin D, Ergocalciferol, (DRISDOL) 1.25 MG (50000 UT) CAPS capsule Take 50,000 Units by mouth every 7 (seven) days.    [provider]      Allergies    Bupropion    Review of Systems  Review of Systems Negative except as per HPI Physical Exam Updated Vital Signs BP 138/83   Pulse 77   Temp 99.1 F (37.3 C) (Oral)   Resp 16   Ht 5\' 7"  (1.702 m)   Wt 97 kg   LMP 01/14/2021 Comment: No period in over a year  SpO2 99%   BMI 33.49 kg/m  Physical Exam Vitals and nursing note reviewed.  Constitutional:      General: She is not in acute distress.    Appearance: She is well-developed. She is not diaphoretic.  HENT:     Head: Normocephalic and atraumatic.  Cardiovascular:     Pulses: Normal pulses.  Pulmonary:     Effort: Pulmonary effort is normal.  Abdominal:     Palpations: Abdomen is soft.     Tenderness: There is no abdominal  tenderness.  Musculoskeletal:        General: Tenderness present. No swelling or deformity.     Thoracic back: No tenderness or bony tenderness.     Lumbar back: Tenderness present. No bony tenderness. Positive right straight leg raise test and positive left straight leg raise test.       Back:     Right lower leg: No edema.     Left lower leg: No edema.  Skin:    General: Skin is warm and dry.     Findings: No erythema or rash.  Neurological:     Mental Status: She is alert and oriented to person, place, and time.     Sensory: No sensory deficit.     Motor: No weakness.     Deep Tendon Reflexes: Reflexes normal.  Psychiatric:        Behavior: Behavior normal.     ED Results / Procedures / Treatments   Labs (all labs ordered are listed, but only abnormal results are displayed) Labs Reviewed  BASIC METABOLIC PANEL - Abnormal; Notable for the following components:      Result Value   Sodium 131 (*)    All other components within normal limits  URINALYSIS, ROUTINE W REFLEX MICROSCOPIC - Abnormal; Notable for the following components:   Hgb urine dipstick SMALL (*)    All other components within normal limits  SARS CORONAVIRUS 2 BY RT PCR  CBC    EKG None  Radiology No results found.  Procedures Procedures    Medications Ordered in ED Medications  methylPREDNISolone acetate (DEPO-MEDROL) injection 80 mg (80 mg Intramuscular Given 05/12/23 1528)  ketorolac (TORADOL) 15 MG/ML injection 15 mg (15 mg Intramuscular Given 05/12/23 1528)    ED Course/ Medical Decision Making/ A&P                                 Medical Decision Making Amount and/or Complexity of Data Reviewed Labs: ordered.   52 year old female with acute on chronic/worsening diffuse back pain. Pain radiates down right leg, reflexes intact, leg strength symmetric, abd soft and non tender. SLR + bilaterally, right SI tenderness.  Prior CT stone study dated 06/02/2019 reviewed, negative for stones.   MRI lumbar spine dated 04/14/2019 reviewed shows lumbar spine spondylosis most notable at L4-L5 with a left foraminal disc protrusion contacting the exiting left L4 nerve root, and severe left neural foraminal narrowing. L5-S1 central disc protrusion with an annular fissure which contacts bilateral descending S1 nerve roots without impingement.  Plan is to dc after IM Toradol and DepoMedrol, start  prednisone tomorrow (no hx of DM). Can try changing muscle relaxor from tizanidine to robaxin. Recommend recheck with PCP, call today to schedule follow up. Return to ER for worsening or concerning symptoms.         Final Clinical Impression(s) / ED Diagnoses Final diagnoses:  Acute bilateral back pain, unspecified back location    Rx / DC Orders ED Discharge Orders          Ordered    predniSONE (STERAPRED UNI-PAK 21 TAB) 10 MG (21) TBPK tablet  Daily        05/12/23 1522    methocarbamol (ROBAXIN) 500 MG tablet  2 times daily        05/12/23 1522              Jeannie Fend, PA-C 05/12/23 1600    Glendora Score, MD 05/13/23 1341

## 2023-05-20 ENCOUNTER — Telehealth: Payer: Self-pay

## 2023-05-20 DIAGNOSIS — F330 Major depressive disorder, recurrent, mild: Secondary | ICD-10-CM

## 2023-05-20 MED ORDER — NORTRIPTYLINE HCL 25 MG PO CAPS
25.0000 mg | ORAL_CAPSULE | Freq: Every day | ORAL | 0 refills | Status: DC
Start: 2023-06-05 — End: 2023-05-20

## 2023-05-20 MED ORDER — NORTRIPTYLINE HCL 25 MG PO CAPS: 25.0000 mg | ORAL_CAPSULE | Freq: Every day | ORAL | 0 refills | Status: DC

## 2023-05-20 NOTE — Telephone Encounter (Signed)
pt called states that she needs a refill on the nortriptyline she states that her insurance would not pay for a 90 day supply only 30 day and the pharmacy states she needs a new rx. pt was last seen on 8-9 next appt 10-11

## 2023-05-20 NOTE — Telephone Encounter (Signed)
Pt.notified

## 2023-05-20 NOTE — Telephone Encounter (Signed)
I have sent 30-day supply of nortriptyline 25 mg to pharmacy.

## 2023-05-20 NOTE — Telephone Encounter (Signed)
called pharmacy they states that patient last filled a 30 day supply on 7-29 and that it did not have any refills. they do have a rx from 8-9 but it states not to fill until 9-19 but the patient is out and needs enough until that rx can be filled.

## 2023-05-21 ENCOUNTER — Other Ambulatory Visit: Payer: Self-pay | Admitting: Psychiatry

## 2023-05-26 ENCOUNTER — Telehealth (HOSPITAL_COMMUNITY): Payer: Self-pay | Admitting: Psychiatry

## 2023-05-26 ENCOUNTER — Ambulatory Visit (HOSPITAL_COMMUNITY): Payer: Medicaid Other | Admitting: Psychiatry

## 2023-05-26 NOTE — Telephone Encounter (Signed)
Therapist attempted to contact patient via phone for scheduled appointment and received voicemail message.  Therapist left message indicating attempt and requesting patient call office.

## 2023-06-10 ENCOUNTER — Ambulatory Visit (INDEPENDENT_AMBULATORY_CARE_PROVIDER_SITE_OTHER): Payer: Medicaid Other | Admitting: Psychiatry

## 2023-06-10 DIAGNOSIS — F330 Major depressive disorder, recurrent, mild: Secondary | ICD-10-CM | POA: Diagnosis not present

## 2023-06-10 DIAGNOSIS — F411 Generalized anxiety disorder: Secondary | ICD-10-CM | POA: Diagnosis not present
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
## 2023-06-12 NOTE — Progress Notes (Signed)
Orders for labs

## 2023-06-20 ENCOUNTER — Other Ambulatory Visit: Payer: Self-pay | Admitting: Psychiatry

## 2023-06-20 DIAGNOSIS — F330 Major depressive disorder, recurrent, mild: Secondary | ICD-10-CM

## 2023-06-21 ENCOUNTER — Other Ambulatory Visit: Payer: Self-pay

## 2023-06-21 ENCOUNTER — Ambulatory Visit
Admission: EM | Admit: 2023-06-21 | Discharge: 2023-06-21 | Disposition: A | Discharge status: Home / Self Care | Payer: Medicaid Other | Attending: Family Medicine | Admitting: Family Medicine

## 2023-06-21 ENCOUNTER — Encounter: Payer: Self-pay | Admitting: Emergency Medicine

## 2023-06-21 DIAGNOSIS — M5431 Sciatica, right side: Secondary | ICD-10-CM | POA: Diagnosis not present

## 2023-06-21 HISTORY — DX: Spinal stenosis, site unspecified: M48.00

## 2023-06-21 MED ORDER — METHOCARBAMOL 500 MG PO TABS: 500.0000 mg | ORAL_TABLET | Freq: Two times a day (BID) | ORAL | 0 refills | Status: AC | PRN

## 2023-06-21 MED ORDER — PREDNISONE 10 MG (21) PO TBPK: ORAL_TABLET | ORAL | 0 refills | Status: DC

## 2023-06-21 MED ORDER — DEXAMETHASONE SODIUM PHOSPHATE 10 MG/ML IJ SOLN
10.0000 mg | Freq: Once | INTRAMUSCULAR | Status: AC
  Administered 2023-06-21: 10 mg via INTRAMUSCULAR
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
## 2023-06-21 NOTE — ED Provider Notes (Addendum)
[facility]    CSN: [id number] Arrival date & time: 06/21/23  1152      History   Chief Complaint Chief Complaint  Patient presents with   Back Pain    HPI Tiffany Morgan is a 52 y.o. female.   Patient presenting today with about a week of progressively worsening right lower back pain radiating down the right leg.  Denies fever, chills, injury to the area apart from taking the trash out the other day, bowel or bladder incontinence beyond baseline, leg numbness.  Has had this issue off and on in the past and has known spinal stenosis and degenerative spine.  Taking over-the-counter pain relievers with minimal relief.    Past Medical History:  Diagnosis Date   Anxiety    Arthritis    spine   Bicornate uterus    Chronic back pain    started about 6 years ago - four bulging discs   Depression    Essential hypertension 08/22/2017   Spinal stenosis     Patient Active Problem List   Diagnosis Date Noted   Dysmenorrhea 10/13/2019   Bicornuate uterus 05/04/2018   Menorrhagia with regular cycle 03/25/2018   Lymphocytosis 12/09/2017   Essential hypertension 08/22/2017   MDD (major depressive disorder), recurrent episode, moderate (HCC) 07/01/2017   Anxiety disorder 04/02/2017   Panic disorder 04/02/2017   Vitamin D deficiency 02/21/2017   Tobacco abuse 01/16/2017   Atherosclerosis of aorta (HCC) 01/16/2017   Degenerative arthritis of spine 01/16/2017   Chronic midline back pain 01/16/2017   Generalized social phobia 01/16/2017   Overweight 01/16/2017    Past Surgical History:  Procedure Laterality Date   BREAST BIOPSY Left 01/31/2016   benign   BREAST BIOPSY Left 08/17/2013   benign   CESAREAN SECTION     3   DILATATION AND CURETTAGE/HYSTEROSCOPY WITH MINERVA N/A 02/21/2021   Procedure: DILATATION AND CURETTAGE/HYSTEROSCOPY WITH MINERVA;  Surgeon: Lazaro Arms, MD;  Location: [facility];  Service: Gynecology;  Laterality: N/A;   TUBAL LIGATION       OB History     Gravida  5   Para  2   Term  2   Preterm      AB  3   Living  2      SAB      IAB  3   Ectopic      Multiple      Live Births  2        Obstetric Comments  Cesarean section x 2, due to bicornuate uterus and fetal malpresntaton BREECH, THEN REPEAT          Home Medications    Prior to Admission medications   Medication Sig Start Date End Date Taking? Authorizing Provider  busPIRone (BUSPAR) 10 MG tablet Take 2 tablets (20 mg total) by mouth 3 (three) times daily. 05/01/23 10/28/23  [name], MD  clonazePAM (KLONOPIN) 0.5 MG tablet Take 0.5 tablets (0.25 mg total) by mouth 2 (two) times daily. 05/11/23 07/10/23  [name], MD  estradiol (ESTRACE) 2 MG tablet Take 1 tablet (2 mg total) by mouth daily. 02/13/23 05/14/23  Myna Hidalgo, DO  gabapentin (NEURONTIN) 300 MG capsule Take 1 capsule (300 mg total) by mouth 3 (three) times daily. 09/01/20   Daphine Deutscher, Mary-Margaret, FNP  methocarbamol (ROBAXIN) 500 MG tablet Take 1 tablet (500 mg total) by mouth 2 (two) times daily as needed for muscle spasms. 06/21/23   Particia Nearing, PA-C  metoprolol tartrate (LOPRESSOR) 25 MG tablet Take 25 mg by mouth 2 (two) times daily.     [provider]  nortriptyline (PAMELOR) 25 MG capsule Take 1 capsule (25 mg total) by mouth at bedtime. 06/20/23 07/20/23  [name], MD  oxybutynin (DITROPAN) 5 MG tablet Take 5 mg by mouth daily as needed for bladder spasms. Patient not taking: Reported on 02/13/2023    [provider]  pantoprazole (PROTONIX) 40 MG tablet Take 40 mg by mouth daily. 09/20/20   [provider]  predniSONE (STERAPRED UNI-PAK 21 TAB) 10 MG (21) TBPK tablet Take 6 tabs day one, 5 tabs day two, 4 tabs day three, etc 06/21/23   Particia Nearing, PA-C  rosuvastatin (CRESTOR) 10 MG tablet Take 10 mg by mouth daily. 09/28/19   [provider]  SUMAtriptan (IMITREX) 50 MG tablet Take 50 mg by mouth as  needed for migraine. 09/23/19   [provider]  traZODone (DESYREL) 100 MG tablet Take 2 tablets (200 mg total) by mouth at bedtime as needed for sleep. 01/31/23 07/30/23  [name], MD  venlafaxine XR (EFFEXOR-XR) 150 MG 24 hr capsule Take 1 capsule (150 mg total) by mouth daily. Total of 225 mg daily. Take along with 75 mg cap 05/12/23 11/08/23  [name], MD  venlafaxine XR (EFFEXOR-XR) 75 MG 24 hr capsule Take 1 capsule (75 mg total) by mouth daily with breakfast. Total of 225 mg daily. Take along with 150 mg cap 01/22/23 07/21/23  [name], MD  Vitamin D, Ergocalciferol, (DRISDOL) 1.25 MG (50000 UT) CAPS capsule Take 50,000 Units by mouth every 7 (seven) days.    [provider]    Family History Family History  Problem Relation Age of Onset   Hypertension Mother    Depression Mother    Hyperlipidemia Mother    Heart disease Mother    Heart attack Mother    Cancer Father        pancreatic   Mental illness Sister        schizoid   Diabetes Sister    Schizophrenia Sister    Cancer Maternal Grandmother        myleodysplastic syndrome   Depression Maternal Grandmother    Myelodysplastic syndrome Maternal Grandmother    Diabetes Maternal Grandmother    Hypertension Maternal Grandmother    Alcohol abuse Maternal Grandfather     Social History Social History   Tobacco Use   Smoking status: Every Day    Current packs/day: 0.50    Average packs/day: 0.5 packs/day for 27.8 years (13.9 ttl pk-yrs)    Types: Cigarettes    Start date: 09/17/1995   Smokeless tobacco: Never   Tobacco comments:    trying to quit  Vaping Use   Vaping status: Never Used  Substance Use Topics   Alcohol use: No   Drug use: Yes    Types: Marijuana    Comment: 2 joints per week     Allergies   Bupropion   Review of Systems Review of Systems PER HPI  Physical Exam Triage Vital Signs ED Triage Vitals  Encounter Vitals Group     BP 06/21/23 1202 (!) 140/83      Systolic BP Percentile --      Diastolic BP Percentile --      Pulse Rate 06/21/23 1202 72     Resp 06/21/23 1202 20     Temp 06/21/23 1202 98.8 F (37.1 C)     Temp Source 06/21/23  1202 Oral     SpO2 06/21/23 1202 96 %     Weight --      Height --      Head Circumference --      Peak Flow --      Pain Score 06/21/23 1200 10     Pain Loc --      Pain Education --      Exclude from Growth Chart --    No data found.  Updated Vital Signs BP (!) 140/83 (BP Location: Right Arm)   Pulse 72   Temp 98.8 F (37.1 C) (Oral)   Resp 20   LMP 01/14/2021 Comment: No period in over a year  SpO2 96%   Visual Acuity Right Eye Distance:   Left Eye Distance:   Bilateral Distance:    Right Eye Near:   Left Eye Near:    Bilateral Near:     Physical Exam Vitals and nursing note reviewed.  Constitutional:      Appearance: Normal appearance. She is not ill-appearing.  HENT:     Head: Atraumatic.  Eyes:     Extraocular Movements: Extraocular movements intact.     Conjunctiva/sclera: Conjunctivae normal.  Cardiovascular:     Rate and Rhythm: Normal rate and regular rhythm.     Heart sounds: Normal heart sounds.  Pulmonary:     Effort: Pulmonary effort is normal.     Breath sounds: Normal breath sounds.  Musculoskeletal:        General: Tenderness present. Normal range of motion.     Cervical back: Normal range of motion and neck supple.     Comments: Tender to palpation to the right lumbar paraspinal musculature and right piriformis.  Negative straight leg raise bilateral lower EXTR.  No midline spinal tenderness to palpation of UC.  Normal gait and range of motion  Skin:    General: Skin is warm and dry.  Neurological:     Mental Status: She is alert and oriented to person, place, and time.     Motor: No weakness.     Gait: Gait normal.     Comments: B/l LE neurovascularly intact  Psychiatric:        Mood and Affect: Mood normal.        Thought Content: Thought content normal.         Judgment: Judgment normal.      UC Treatments / Results  Labs (all labs ordered are listed, but only abnormal results are displayed) Labs Reviewed - No data to display  EKG   Radiology No results found.  Procedures Procedures (including critical care time)  Medications Ordered in UC Medications  dexamethasone (DECADRON) injection 10 mg (10 mg Intramuscular Given 06/21/23 1230)    Initial Impression / Assessment and Plan / UC Course  I have reviewed the triage vital signs and the nursing notes.  Pertinent labs & imaging results that were available during my care of the patient were reviewed by me and considered in my medical decision making (see chart for details).     Consistent with sciatica, likely from piriformis syndrome and lumbar strain.  No red flag findings today, will treat with IM Decadron, Robaxin and if no resolution with this prednisone taper sent.  Follow-up with PCP for recheck.  Final Clinical Impressions(s) / UC Diagnoses   Final diagnoses:  Right sided sciatica   Discharge Instructions   None    ED Prescriptions     Medication Sig Dispense Auth. Provider  methocarbamol (ROBAXIN) 500 MG tablet Take 1 tablet (500 mg total) by mouth 2 (two) times daily as needed for muscle spasms. 20 tablet Particia Nearing, [place]   predniSONE (STERAPRED UNI-PAK 21 TAB) 10 MG (21) TBPK tablet Take 6 tabs day one, 5 tabs day two, 4 tabs day three, etc 21 tablet Particia Nearing, [place]      PDMP not reviewed this encounter.   Particia Nearing, [place] 06/21/23 1259    Roosvelt Maser [place], [place] 06/21/23 1300

## 2023-06-21 NOTE — ED Triage Notes (Signed)
Pt reports right lower back pain that radiates to right leg x1 week. Reports chronic hx of back "problems" with intermittent urinary incontinence. Denies any recent injury. States increase in severity of urinary incontinence with latest back pain flare-up. Pt tearful in triage.

## 2023-06-27 ENCOUNTER — Telehealth: Payer: Medicaid Other | Admitting: Psychiatry

## 2023-06-27 ENCOUNTER — Other Ambulatory Visit (HOSPITAL_COMMUNITY): Payer: Self-pay | Admitting: Neurosurgery

## 2023-06-27 ENCOUNTER — Encounter: Payer: Self-pay | Admitting: Psychiatry

## 2023-06-27 DIAGNOSIS — F330 Major depressive disorder, recurrent, mild: Secondary | ICD-10-CM

## 2023-06-27 DIAGNOSIS — M5441 Lumbago with sciatica, right side: Secondary | ICD-10-CM

## 2023-06-27 DIAGNOSIS — G4700 Insomnia, unspecified: Secondary | ICD-10-CM | POA: Diagnosis not present

## 2023-06-27 DIAGNOSIS — F411 Generalized anxiety disorder: Secondary | ICD-10-CM

## 2023-06-27 MED ORDER — VENLAFAXINE HCL ER 75 MG PO CP24: 75.0000 mg | ORAL_CAPSULE | Freq: Every day | ORAL | 1 refills | Status: DC

## 2023-06-27 MED ORDER — NORTRIPTYLINE HCL 25 MG PO CAPS: 25.0000 mg | ORAL_CAPSULE | Freq: Every day | ORAL | 0 refills | Status: DC

## 2023-06-27 NOTE — Patient Instructions (Signed)
Continue venlafaxine 225 mg daily  Continue nortriptyline 25 mg at night Continue Buspar 20 mg three times a day  Continue Trazodone 200 mg at night as needed for sleep  Discontinue clonazepam  Next appointment: 12/6 at 11 am

## 2023-07-03 ENCOUNTER — Ambulatory Visit (HOSPITAL_COMMUNITY)
Admission: RE | Admit: 2023-07-03 | Discharge: 2023-07-03 | Disposition: A | Discharge status: Home / Self Care | Payer: Medicaid Other | Source: Ambulatory Visit | Attending: Neurosurgery | Admitting: Neurosurgery

## 2023-07-03 DIAGNOSIS — M5441 Lumbago with sciatica, right side: Secondary | ICD-10-CM | POA: Diagnosis present

## 2023-07-06 ENCOUNTER — Other Ambulatory Visit: Payer: Self-pay | Admitting: Psychiatry

## 2023-07-21 ENCOUNTER — Ambulatory Visit: Payer: Medicaid Other | Attending: Internal Medicine | Admitting: Internal Medicine

## 2023-07-21 ENCOUNTER — Encounter: Payer: Self-pay | Admitting: Internal Medicine

## 2023-07-21 VITALS — BP 138/92 | HR 80 | Ht 67.0 in | Wt 214.0 lb

## 2023-07-21 DIAGNOSIS — E7849 Other hyperlipidemia: Secondary | ICD-10-CM

## 2023-07-21 DIAGNOSIS — R079 Chest pain, unspecified: Secondary | ICD-10-CM | POA: Insufficient documentation

## 2023-07-21 DIAGNOSIS — E785 Hyperlipidemia, unspecified: Secondary | ICD-10-CM | POA: Insufficient documentation

## 2023-07-21 HISTORY — DX: Hyperlipidemia, unspecified: E78.5

## 2023-07-21 NOTE — Progress Notes (Signed)
Cardiology Office Note  Date: 07/21/2023   ID: Tiffany Morgan, DOB 1971/09/08, MRN [id number]  PCP:  Benita Stabile, MD  Cardiologist:  Marjo Bicker, MD Electrophysiologist:  None   History of Present Illness: Tiffany Morgan is a 52 y.o. female known to have HTN, anxiety, lumbar arthritis with sciatica was referred to cardiology clinic for evaluation of chest pain.   Ongoing chronic chest pain since 2014, both rest and exertion, last for few minutes.  METs less than 4, cannot walk for longer distances due to chronic low back pain.  Can only walk 60 steps on the playground.  No DOE, orthopnea, PND, dizziness, syncope, palpitations or leg swelling.  Past Medical History:  Diagnosis Date   Anxiety    Arthritis    spine   Bicornate uterus    Chronic back pain    started about 6 years ago - four bulging discs   Depression    Essential hypertension 08/22/2017   Spinal stenosis     Past Surgical History:  Procedure Laterality Date   BREAST BIOPSY Left 01/31/2016   benign   BREAST BIOPSY Left 08/17/2013   benign   CESAREAN SECTION     3   DILATATION AND CURETTAGE/HYSTEROSCOPY WITH MINERVA N/A 02/21/2021   Procedure: DILATATION AND CURETTAGE/HYSTEROSCOPY WITH MINERVA;  Surgeon: Lazaro Arms, MD;  Location: [facility];  Service: Gynecology;  Laterality: N/A;   TUBAL LIGATION      Current Outpatient Medications  Medication Sig Dispense Refill   busPIRone (BUSPAR) 10 MG tablet Take 2 tablets (20 mg total) by mouth 3 (three) times daily. 540 tablet 1   estradiol (ESTRACE) 2 MG tablet Take 1 tablet (2 mg total) by mouth daily. 90 tablet 4   gabapentin (NEURONTIN) 300 MG capsule Take 1 capsule (300 mg total) by mouth 3 (three) times daily. 90 capsule 0   metoprolol tartrate (LOPRESSOR) 25 MG tablet Take 25 mg by mouth 2 (two) times daily.      nortriptyline (PAMELOR) 25 MG capsule Take 1 capsule (25 mg total) by mouth at bedtime. 30 capsule 0   pantoprazole (PROTONIX) 40 MG  tablet Take 40 mg by mouth daily.     rosuvastatin (CRESTOR) 10 MG tablet Take 10 mg by mouth daily.     SUMAtriptan (IMITREX) 50 MG tablet Take 50 mg by mouth as needed for migraine.     traZODone (DESYREL) 100 MG tablet Take 2 tablets (200 mg total) by mouth at bedtime as needed for sleep. 180 tablet 1   venlafaxine XR (EFFEXOR-XR) 150 MG 24 hr capsule Take 1 capsule (150 mg total) by mouth daily. Total of 225 mg daily. Take along with 75 mg cap 90 capsule 1   venlafaxine XR (EFFEXOR-XR) 75 MG 24 hr capsule Take 1 capsule (75 mg total) by mouth daily with breakfast. Total of 225 mg daily. Take along with 150 mg cap 90 capsule 1   Vitamin D, Ergocalciferol, (DRISDOL) 1.25 MG (50000 UT) CAPS capsule Take 50,000 Units by mouth every 7 (seven) days.     methocarbamol (ROBAXIN) 500 MG tablet Take 1 tablet (500 mg total) by mouth 2 (two) times daily as needed for muscle spasms. (Patient not taking: Reported on 07/21/2023) 20 tablet 0   oxybutynin (DITROPAN) 5 MG tablet Take 5 mg by mouth daily as needed for bladder spasms. (Patient not taking: Reported on 02/13/2023)     predniSONE (STERAPRED UNI-PAK 21 TAB) 10 MG (21) TBPK tablet Take 6  tabs day one, 5 tabs day two, 4 tabs day three, etc 21 tablet 0   No current facility-administered medications for this visit.   Allergies:  Bupropion   Social History: The patient  reports that she has been smoking cigarettes. She started smoking about 27 years ago. She has a 13.9 pack-year smoking history. She has never used smokeless tobacco. She reports current drug use. Drug: Marijuana. She reports that she does not drink alcohol.   Family History: The patient's family history includes Alcohol abuse in her maternal grandfather; Cancer in her father and maternal grandmother; Depression in her maternal grandmother and mother; Diabetes in her maternal grandmother and sister; Heart attack in her mother; Heart disease in her mother; Hyperlipidemia in her mother;  Hypertension in her maternal grandmother and mother; Mental illness in her sister; Myelodysplastic syndrome in her maternal grandmother; Schizophrenia in her sister.   ROS:  Please see the history of present illness. Otherwise, complete review of systems is positive for none.  All other systems are reviewed and negative.   Physical Exam: VS:  BP (!) 138/92 (BP Location: Left Arm)   Pulse 80   Ht 5\' 7"  (1.702 m)   Wt 214 lb (97.1 kg)   LMP 01/14/2021 Comment: No period in over a year  SpO2 98%   BMI 33.52 kg/m , BMI Body mass index is 33.52 kg/m.  Wt Readings from Last 3 Encounters:  07/21/23 214 lb (97.1 kg)  05/12/23 213 lb 13.5 oz (97 kg)  02/24/23 213 lb 12.8 oz (97 kg)    General: Patient appears comfortable at rest. HEENT: Conjunctiva and lids normal, oropharynx clear with moist mucosa. Neck: Supple, no elevated JVP or carotid bruits, no thyromegaly. Lungs: Clear to auscultation, nonlabored breathing at rest. Cardiac: Regular rate and rhythm, no S3 or significant systolic murmur, no pericardial rub. Abdomen: Soft, nontender, no hepatomegaly, bowel sounds present, no guarding or rebound. Extremities: No pitting edema, distal pulses 2+. Skin: Warm and dry. Musculoskeletal: No kyphosis. Neuropsychiatric: Alert and oriented x3, affect grossly appropriate.  Recent Labwork: 05/12/2023: BUN 7; Creatinine, Ser 0.79; Hemoglobin 14.0; Platelets 311; Potassium 4.0; Sodium 131     Component Value Date/Time   CHOL 216 (H) 02/19/2017 0906   TRIG 179 (H) 02/19/2017 0906   HDL 32 (L) 02/19/2017 0906   CHOLHDL 6.8 (H) 02/19/2017 0906   VLDL 36 (H) 02/19/2017 0906   LDLCALC 148 (H) 02/19/2017 0906     Assessment and Plan:  Chest pain: Ongoing chest pain since 2014, both rest and exertion.  METs less than 4, ambulation limited due to chronic back pain.  Obtain Lexiscan.  HTN, controlled: Continue metoprolol tartrate 25 mg twice daily.  HLD, unknown values: Continue rosuvastatin 10  mg nightly, goal LDL less than 100.  Follows with PCP.   I spent a total duration of 45 minutes during the prior notes, labs, EKG, face-to-face discussion/counseling of her medical condition, pathophysiology, evaluation, management and documenting the findings in the note.    Medication Adjustments/Labs and Tests Ordered: Current medicines are reviewed at length with the patient today.  Concerns regarding medicines are outlined above.    Disposition:  Follow up pending results  Signed, Aleczander Fandino Verne Spurr, MD, 07/21/2023 4:18 PM    [facility] [address], [place], [place] [address]

## 2023-07-21 NOTE — Patient Instructions (Signed)
Medication Instructions:  Your physician recommends that you continue on your current medications as directed. Please refer to the Current Medication list given to you today.  *If you need a refill on your cardiac medications before your next appointment, please call your pharmacy*   Lab Work: None If you have labs (blood work) drawn today and your tests are completely normal, you will receive your results only by: MyChart Message (if you have MyChart) OR A paper copy in the mail If you have any lab test that is abnormal or we need to change your treatment, we will call you to review the results.   Testing/Procedures: Your physician has requested that you have a lexiscan myoview. For further information please visit [email or web address] Please follow instruction sheet, as given.    Follow-Up: At [facility], you and your health needs are our priority.  As part of our continuing mission to provide you with exceptional heart care, we have created designated Provider Care Teams.  These Care Teams include your primary Cardiologist (physician) and Advanced Practice Providers (APPs -  Physician Assistants and Nurse Practitioners) who all work together to provide you with the care you need, when you need it.  We recommend signing up for the patient portal called "MyChart".  Sign up information is provided on this After Visit Summary.  MyChart is used to connect with patients for Virtual Visits (Telemedicine).  Patients are able to view lab/test results, encounter notes, upcoming appointments, etc.  Non-urgent messages can be sent to your provider as well.   To learn more about what you can do with MyChart, go to [email or web address].    Your next appointment:    Pending  Provider:   You may see Luane School, MD or one of the following Advanced Practice Providers on your designated Care Team:   [place], PA-C  Jacolyn Reedy, [place]     Other Instructions

## 2023-07-31 ENCOUNTER — Encounter (HOSPITAL_COMMUNITY): Payer: Medicaid Other
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
## 2023-08-13 ENCOUNTER — Telehealth: Payer: Medicaid Other | Admitting: Psychiatry

## 2023-08-13 ENCOUNTER — Encounter: Payer: Self-pay | Admitting: Psychiatry

## 2023-08-13 DIAGNOSIS — F330 Major depressive disorder, recurrent, mild: Secondary | ICD-10-CM | POA: Diagnosis not present

## 2023-08-13 DIAGNOSIS — F411 Generalized anxiety disorder: Secondary | ICD-10-CM | POA: Diagnosis not present

## 2023-08-13 DIAGNOSIS — F331 Major depressive disorder, recurrent, moderate: Secondary | ICD-10-CM | POA: Diagnosis not present

## 2023-08-13 DIAGNOSIS — G4700 Insomnia, unspecified: Secondary | ICD-10-CM

## 2023-08-13 MED ORDER — NORTRIPTYLINE HCL 25 MG PO CAPS
25.0000 mg | ORAL_CAPSULE | Freq: Every day | ORAL | 0 refills | Status: DC
Start: 2023-08-19 — End: 2023-10-10

## 2023-08-13 MED ORDER — TRAZODONE HCL 100 MG PO TABS: 200.0000 mg | ORAL_TABLET | Freq: Every evening | ORAL | 1 refills | Status: DC | PRN

## 2023-08-18 ENCOUNTER — Ambulatory Visit (HOSPITAL_COMMUNITY): Payer: Medicaid Other | Admitting: Psychiatry

## 2023-08-18 ENCOUNTER — Telehealth (HOSPITAL_COMMUNITY): Payer: Self-pay | Admitting: Psychiatry

## 2023-08-18 NOTE — Telephone Encounter (Signed)
Therapist attempted to contact patient via phone regarding scheduled in office appointment and received voicemail recording.  Therapist left message indicating attempt and requesting patient call office. 

## 2023-08-20 ENCOUNTER — Other Ambulatory Visit: Payer: Self-pay | Admitting: Psychiatry

## 2023-08-20 DIAGNOSIS — F330 Major depressive disorder, recurrent, mild: Secondary | ICD-10-CM

## 2023-08-22 ENCOUNTER — Telehealth: Payer: Medicaid Other | Admitting: Psychiatry

## 2023-09-18 ENCOUNTER — Telehealth (HOSPITAL_COMMUNITY): Payer: Self-pay | Admitting: Psychiatry

## 2023-09-18 ENCOUNTER — Encounter (HOSPITAL_COMMUNITY): Payer: Self-pay | Admitting: Psychiatry

## 2023-09-18 ENCOUNTER — Ambulatory Visit (HOSPITAL_COMMUNITY): Payer: Medicaid Other | Admitting: Psychiatry

## 2023-09-18 NOTE — Telephone Encounter (Signed)
 Therapist attempted to contact patient via phone regarding scheduled in office appointment.  Therapist received voicemail message indicating voicemail box is full.

## 2023-09-18 NOTE — Progress Notes (Unsigned)
 Opened in error

## 2023-09-24 ENCOUNTER — Other Ambulatory Visit: Payer: Self-pay | Admitting: Family Medicine

## 2023-09-30 ENCOUNTER — Ambulatory Visit (INDEPENDENT_AMBULATORY_CARE_PROVIDER_SITE_OTHER): Payer: Medicaid Other | Admitting: Adult Health

## 2023-09-30 ENCOUNTER — Encounter: Payer: Self-pay | Admitting: Adult Health

## 2023-09-30 VITALS — BP 132/81 | HR 84 | Ht 67.0 in | Wt 214.0 lb

## 2023-09-30 DIAGNOSIS — L0232 Furuncle of buttock: Secondary | ICD-10-CM | POA: Diagnosis not present

## 2023-09-30 DIAGNOSIS — Z79899 Other long term (current) drug therapy: Secondary | ICD-10-CM

## 2023-09-30 DIAGNOSIS — N3941 Urge incontinence: Secondary | ICD-10-CM | POA: Diagnosis not present

## 2023-09-30 DIAGNOSIS — L02224 Furuncle of groin: Secondary | ICD-10-CM | POA: Diagnosis not present

## 2023-09-30 DIAGNOSIS — R32 Unspecified urinary incontinence: Secondary | ICD-10-CM | POA: Insufficient documentation

## 2023-09-30 DIAGNOSIS — N950 Postmenopausal bleeding: Secondary | ICD-10-CM | POA: Diagnosis not present

## 2023-09-30 LAB — POCT URINALYSIS DIPSTICK
Glucose, UA: NEGATIVE
Ketones, UA: NEGATIVE
Nitrite, UA: NEGATIVE
Protein, UA: NEGATIVE

## 2023-09-30 MED ORDER — SILVER SULFADIAZINE 1 % EX CREA
1.0000 | TOPICAL_CREAM | Freq: Two times a day (BID) | CUTANEOUS | 0 refills | Status: DC
Start: 2023-09-30 — End: 2024-01-12

## 2023-09-30 MED ORDER — FLUCONAZOLE 150 MG PO TABS
ORAL_TABLET | ORAL | 1 refills | Status: DC
Start: 2023-09-30 — End: 2023-10-20

## 2023-09-30 MED ORDER — SULFAMETHOXAZOLE-TRIMETHOPRIM 800-160 MG PO TABS
1.0000 | ORAL_TABLET | Freq: Two times a day (BID) | ORAL | 0 refills | Status: DC
Start: 2023-09-30 — End: 2023-10-20

## 2023-09-30 NOTE — Progress Notes (Signed)
 Subjective:     Patient ID: Tiffany Morgan, female   DOB: [birth year], 53 y.o.   MRN: [id number]  HPI Tiffany Morgan is a 53 year old black female,single, PM,in complaining of  boils, one has popped already,in groin area, and has had vaginal spotting, she is on estrace  2 mg and no progestin. She also complains if bladder leaking.     Component Value Date/Time   DIAGPAP (A) 02/13/2023 1407    - Atypical squamous cells of undetermined significance (ASC-US )   DIAGPAP  02/25/2018 0000    NEGATIVE FOR INTRAEPITHELIAL LESIONS OR MALIGNANCY.   HPVHIGH Positive (A) 02/13/2023 1407   ADEQPAP  02/13/2023 1407    Satisfactory for evaluation; transformation zone component PRESENT.   ADEQPAP  02/25/2018 0000    Satisfactory for evaluation  endocervical/transformation zone component PRESENT.   PCP is Dr Shona. Review of Systems +boils +vaginal spotting +urinary leakage, esp if has to go  Reviewed past medical,surgical, social and family history. Reviewed medications and allergies.     Objective:   Physical Exam BP 132/81 (BP Location: Left Arm, Patient Position: Sitting, Cuff Size: Normal)   Pulse 84   Ht 5' 7 (1.702 m)   Wt 214 lb (97.1 kg)   LMP 01/14/2021 Comment: No period in over a year  BMI 33.52 kg/m  urine dipstick 2+ blood and 3+leuks. Skin warm and dry.Pelvic: external genitalia is normal in appearance, has boil right groin that has popped, still firm around opening,  and has small boil right buttock that is tender and slightly red,vagina:pink,urethra has no lesions or masses noted, cervix:smooth uterus: normal size, and  non tender, no masses felt, adnexa: no masses or tenderness noted. Bladder is non tender and no masses felt. Fall risk is low  Upstream - 09/30/23 1108       Pregnancy Intention Screening   Does the patient want to become pregnant in the next year? No    Does the patient's partner want to become pregnant in the next year? No    Would the patient like to discuss  contraceptive options today? No      Contraception Wrap Up   Current Method Female Sterilization    End Method Female Sterilization    Contraception Counseling Provided No                Examination chaperoned by Clarita Salt LPN Assessment:     1. Urinary incontinence, unspecified type UA C&S sent  - POCT Urinalysis Dipstick - Urine Culture - Urinalysis, Routine w reflex microscopic  2. PMB (postmenopausal bleeding) +vaginal bleeding Will get US  10/01/23 in office to assess uterine lining - US  PELVIC COMPLETE WITH TRANSVAGINAL; Future  3. Urge incontinence  4. Boil of buttock (Primary) Has small boil right buttock, Use warm compress, do not squeeze Will rx septra  ds and silvadene  And she requests diflucan  Will recheck in 2 weeks  Meds ordered this encounter  Medications   sulfamethoxazole -trimethoprim  (BACTRIM  DS) 800-160 MG tablet    Sig: Take 1 tablet by mouth 2 (two) times daily. Take 1 bid    Dispense:  28 tablet    Refill:  0    Supervising Provider:   JAYNE MINDER H [[id number]]   silver  sulfADIAZINE  (SILVADENE ) 1 % cream    Sig: Apply 1 Application topically 2 (two) times daily.    Dispense:  50 g    Refill:  0    Supervising Provider:   JAYNE MINDER H [[id number]]   fluconazole  (DIFLUCAN ) 150  MG tablet    Sig: Take 1 now and 1 in 3 days    Dispense:  2 tablet    Refill:  1    Supervising Provider:   JAYNE, LUTHER H [[id number]]     5. Boil of groin Has popped   6. Current use of estrogen therapy On unopposed estrace  2 mg 1 daily She is aware that needs to be on a progestin, but will get US  first to assess bleeding and will talk when results back    Plan:     Return in 1 day for US  and then follow up in 2 weeks to recheck boils

## 2023-10-01 ENCOUNTER — Ambulatory Visit: Payer: Medicaid Other

## 2023-10-01 DIAGNOSIS — N950 Postmenopausal bleeding: Secondary | ICD-10-CM

## 2023-10-01 LAB — URINALYSIS, ROUTINE W REFLEX MICROSCOPIC
Bilirubin, UA: NEGATIVE
Glucose, UA: NEGATIVE
Ketones, UA: NEGATIVE
Nitrite, UA: NEGATIVE
Specific Gravity, UA: 1.023 (ref 1.005–1.030)
Urobilinogen, Ur: 0.2 mg/dL (ref 0.2–1.0)
pH, UA: 5.5 (ref 5.0–7.5)

## 2023-10-01 LAB — MICROSCOPIC EXAMINATION
Casts: NONE SEEN /[LPF]
WBC, UA: >30 /[HPF] — AB (ref 0–5)

## 2023-10-01 NOTE — Progress Notes (Signed)
 PELVIC US  TA/TV: heterogeneous retroverted bicornuate uterus,difficult to visualize bicornuate uterus on to day scan best seen on 2019 scan,? Left subserosal fibroid 2.9 x 3.3 x 2.5 cm,multiple simple nabothian cysts,homogeneous avascular thickened endometrium,EEC 5.3 cm,normal right ovary,simple left ovarian cyst right 2.3 x 1.4 x 1.1 cm,no free fluid,no pain during ultrasound

## 2023-10-02 ENCOUNTER — Ambulatory Visit (HOSPITAL_COMMUNITY): Payer: Medicaid Other | Admitting: Psychiatry

## 2023-10-02 ENCOUNTER — Other Ambulatory Visit: Payer: Self-pay | Admitting: Adult Health

## 2023-10-02 LAB — URINE CULTURE

## 2023-10-02 MED ORDER — AMPICILLIN 500 MG PO CAPS: 500.0000 mg | ORAL_CAPSULE | Freq: Three times a day (TID) | ORAL | 0 refills | Status: AC

## 2023-10-02 NOTE — Progress Notes (Signed)
Rx ampicillin
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
## 2023-10-07 ENCOUNTER — Other Ambulatory Visit (HOSPITAL_COMMUNITY)
Admission: RE | Admit: 2023-10-07 | Discharge: 2023-10-07 | Disposition: A | Discharge status: Home / Self Care | Payer: Medicaid Other | Source: Ambulatory Visit | Attending: Obstetrics & Gynecology | Admitting: Obstetrics & Gynecology

## 2023-10-07 ENCOUNTER — Ambulatory Visit (INDEPENDENT_AMBULATORY_CARE_PROVIDER_SITE_OTHER): Payer: Medicaid Other | Admitting: Obstetrics & Gynecology

## 2023-10-07 VITALS — Wt 215.0 lb

## 2023-10-07 DIAGNOSIS — N950 Postmenopausal bleeding: Secondary | ICD-10-CM | POA: Diagnosis present

## 2023-10-07 DIAGNOSIS — R9389 Abnormal findings on diagnostic imaging of other specified body structures: Secondary | ICD-10-CM

## 2023-10-07 NOTE — Progress Notes (Signed)
Endometrial Biopsy Procedure Note  Pre-operative Diagnosis: Post menopausal bleeding with thickened endometrium on sonogram  Post-operative Diagnosis: same  Indications: postmenopausal bleeding  Procedure Details   Urine pregnancy test was not done.  The risks (including infection, bleeding, pain, and uterine perforation) and benefits of the procedure were explained to the patient and Written informed consent was obtained.  Antibiotic prophylaxis against endocarditis was not indicated.   The patient was placed in the dorsal lithotomy position.  Bimanual exam showed the uterus to be in the neutral position.  A Graves' speculum inserted in the vagina, and the cervix prepped with povidone iodine.  Endocervical curettage with a Kevorkian curette was not performed.   A sharp tenaculum was applied to the anterior lip of the cervix for stabilization.  A sterile uterine sound was used to sound the uterus to a depth of 6.5 cm.  A Pipelle endometrial aspirator was used to sample the endometrium.  Sample was sent for pathologic examination.  Condition: Stable  Complications: None  Plan:  The patient was advised to call for any fever or for prolonged or severe pain or bleeding. She was advised to use OTC analgesics as needed for mild to moderate pain. She was advised to avoid vaginal intercourse for 48 hours or until the bleeding has completely stopped.  Attending Physician Documentation: I was present for or performed the following: endometrial biopsy

## 2023-10-09 LAB — SURGICAL PATHOLOGY

## 2023-10-10 ENCOUNTER — Encounter: Payer: Self-pay | Admitting: Psychiatry

## 2023-10-10 ENCOUNTER — Telehealth (INDEPENDENT_AMBULATORY_CARE_PROVIDER_SITE_OTHER): Payer: Medicaid Other | Admitting: Psychiatry

## 2023-10-10 DIAGNOSIS — F331 Major depressive disorder, recurrent, moderate: Secondary | ICD-10-CM | POA: Diagnosis not present

## 2023-10-10 DIAGNOSIS — G4700 Insomnia, unspecified: Secondary | ICD-10-CM | POA: Diagnosis not present

## 2023-10-10 DIAGNOSIS — F411 Generalized anxiety disorder: Secondary | ICD-10-CM

## 2023-10-10 MED ORDER — L-METHYLFOLATE 7.5 MG PO TABS: 7.5000 mg | ORAL_TABLET | Freq: Every day | ORAL | 1 refills | Status: DC

## 2023-10-10 MED ORDER — VENLAFAXINE HCL ER 150 MG PO CP24: 150.0000 mg | ORAL_CAPSULE | Freq: Every day | ORAL | 1 refills | Status: DC

## 2023-10-10 MED ORDER — BUSPIRONE HCL 10 MG PO TABS: 20.0000 mg | ORAL_TABLET | Freq: Three times a day (TID) | ORAL | 1 refills | Status: DC

## 2023-10-10 MED ORDER — NORTRIPTYLINE HCL 25 MG PO CAPS
25.0000 mg | ORAL_CAPSULE | Freq: Every day | ORAL | 0 refills | Status: DC
Start: 2023-11-17 — End: 2024-01-29

## 2023-10-10 NOTE — Patient Instructions (Addendum)
Continue venlafaxine 225 mg daily  Continue nortriptyline 25 mg at night  Continue Buspar 20 mg three times a day  Continue Trazodone 200 mg at night as needed for sleep Next appointment: 3/21 at 11 am

## 2023-10-13 ENCOUNTER — Telehealth: Payer: Medicaid Other | Admitting: Obstetrics & Gynecology

## 2023-10-13 DIAGNOSIS — N950 Postmenopausal bleeding: Secondary | ICD-10-CM

## 2023-10-13 MED ORDER — PROGESTERONE 200 MG PO CAPS: ORAL_CAPSULE | ORAL | 3 refills | Status: DC

## 2023-10-13 NOTE — Progress Notes (Signed)
MyChart connect video + audio visit Pt is at work I am in my office Total time: 10 minutes  Follow up appointment for results: EMBx: PMB + 7 mm ES  Chief Complaint  Patient presents with   Follow-up    Last menstrual period 01/14/2021.  EMBx: inactive endometrium  On estrace 2 mg daily, actually started [facility], but insurance did not cover So began estace 2 mg, but I started it unopposed because of her significant BH history, did not want to start progesterone at the same time to decrease variables When she cam back she did not see me and was doing great on the estrogen but did not get started on the progesterone support, that was last Fall  She began having bleeding, not heavy, light period and ES was 7 mm  EMBx benign  Will cycle with prometrium 10 days/month and see her response from both a bleeding standpoint and BH standpoint, if she does well my go to daily dosing since DUAVEE is still not an option   MEDS ordered this encounter: Meds ordered this encounter  Medications   progesterone (PROMETRIUM) 200 MG capsule    Sig: Take 1 nightly the first 10 days of the month    Dispense:  30 capsule    Refill:  3    Orders for this encounter: No orders of the defined types were placed in this encounter.   Impression + Management Plan   ICD-10-CM   1. Post-menopausal bleeding: add prometrium and cycle 1/month x3, then follow up  N95.0       Follow Up: Return if symptoms worsen or fail to improve, for MyChart visit end of April to go over her repsonse to the prometrium.     All questions were answered.  Past Medical History:  Diagnosis Date   Anxiety    Arthritis    spine   Bicornate uterus    Chronic back pain    started about 6 years ago - four bulging discs   Depression    Essential hypertension 08/22/2017   HLD (hyperlipidemia) 07/21/2023   Spinal stenosis     Past Surgical History:  Procedure Laterality Date   BREAST BIOPSY Left 01/31/2016   benign    BREAST BIOPSY Left 08/17/2013   benign   CESAREAN SECTION     3   DILATATION AND CURETTAGE/HYSTEROSCOPY WITH MINERVA N/A 02/21/2021   Procedure: DILATATION AND CURETTAGE/HYSTEROSCOPY WITH MINERVA;  Surgeon: Lazaro Arms, MD;  Location: [facility];  Service: Gynecology;  Laterality: N/A;   TUBAL LIGATION      OB History     Gravida  5   Para  2   Term  2   Preterm      AB  3   Living  2      SAB      IAB  3   Ectopic      Multiple      Live Births  2        Obstetric Comments  Cesarean section x 2, due to bicornuate uterus and fetal malpresntaton BREECH, THEN REPEAT         Allergies  Allergen Reactions   Bupropion Other (See Comments)    Worsening in SI, anxiety    Social History   Socioeconomic History   Marital status: Single    Spouse name: Not on file   Number of children: 2   Years of education: 12   Highest education level: Not on file  Occupational History   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  Tobacco Use   Smoking status: Every Day    Current packs/day: 0.50    Average packs/day: 0.5 packs/day for 28.1 years (14.0 ttl pk-yrs)    Types: Cigarettes    Start date: 09/17/1995   Smokeless tobacco: Never   Tobacco comments:    trying to quit  Vaping Use   Vaping status: Never Used  Substance and Sexual Activity   Alcohol use: No   Drug use: Yes    Types: Marijuana    Comment: 2 joints per week   Sexual activity: Not Currently    Birth control/protection: Surgical, Post-menopausal    Comment: tubal  Other Topics Concern   Not on file  Social History Narrative   Single   Lives with [name]   Feels unable to work due to back pain   Social Drivers of Corporate investment banker Strain: Not on file  Food Insecurity: Not on file  Transportation Needs: Not on file  Physical Activity: Not on file  Stress: Not on file  Social Connections: Not on file    Family History  Problem Relation Age of Onset   Hypertension Mother     Depression Mother    Hyperlipidemia Mother    Heart disease Mother    Heart attack Mother    Cancer Father        pancreatic   Mental illness Sister        schizoid   Diabetes Sister    Schizophrenia Sister    Cancer Maternal Grandmother        myleodysplastic syndrome   Depression Maternal Grandmother    Myelodysplastic syndrome Maternal Grandmother    Diabetes Maternal Grandmother    Hypertension Maternal Grandmother    Alcohol abuse Maternal Grandfather

## 2023-10-15 ENCOUNTER — Ambulatory Visit: Payer: Medicaid Other | Admitting: Adult Health

## 2023-10-20 ENCOUNTER — Ambulatory Visit: Payer: Medicaid Other | Admitting: Adult Health

## 2023-10-20 ENCOUNTER — Encounter: Payer: Self-pay | Admitting: Adult Health

## 2023-10-20 VITALS — BP 168/85 | HR 82 | Ht 67.0 in | Wt 210.5 lb

## 2023-10-20 DIAGNOSIS — L0232 Furuncle of buttock: Secondary | ICD-10-CM

## 2023-10-20 DIAGNOSIS — I10 Essential (primary) hypertension: Secondary | ICD-10-CM

## 2023-10-20 NOTE — Progress Notes (Addendum)
  Subjective:     Patient ID: Tiffany Morgan, female   DOB: 27-Jun-1971, 53 y.o.   MRN: [id number]  HPI Tiffany Morgan is a 53 year old black female,single, PM back in follow up on taking antibiotic and silvadene for boil and it has resolved, has been sleeping better and laughing since starting Prometrium.      Component Value Date/Time   DIAGPAP (A) 02/13/2023 1407    - Atypical squamous cells of undetermined significance (ASC-US)   DIAGPAP  02/25/2018 0000    NEGATIVE FOR INTRAEPITHELIAL LESIONS OR MALIGNANCY.   HPVHIGH Positive (A) 02/13/2023 1407   ADEQPAP  02/13/2023 1407    Satisfactory for evaluation; transformation zone component PRESENT.   ADEQPAP  02/25/2018 0000    Satisfactory for evaluation  endocervical/transformation zone component PRESENT.    PCP is Dr Margo Aye  Review of Systems Boil has resolved  Reviewed past medical,surgical, social and family history. Reviewed medications and allergies.     Objective:   Physical Exam BP (!) 168/85 (BP Location: Right Arm, Patient Position: Sitting, Cuff Size: Normal)   Pulse 82   Ht 5\' 7"  (1.702 m)   Wt 210 lb 8 oz (95.5 kg)   LMP 01/14/2021 Comment: No period in over a year  BMI 32.97 kg/m     Skin warm and dry.Pelvic: external genitalia is normal in appearance no lesions, boil has resolved  Upstream - 10/20/23 1417       Pregnancy Intention Screening   Does the patient want to become pregnant in the next year? No    Does the patient's partner want to become pregnant in the next year? No    Would the patient like to discuss contraceptive options today? No      Contraception Wrap Up   Current Method Female Sterilization    End Method Female Sterilization    Contraception Counseling Provided No            Examination chaperoned by Malachy Mood LPN  Assessment:     1. Boil of buttock (Primary) Resolved   2. Essential hypertension Take BP meds Follow up with PCP    Plan:     Follow up prn
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
## 2023-12-04 ENCOUNTER — Other Ambulatory Visit: Payer: Self-pay | Admitting: Adult Health

## 2023-12-05 ENCOUNTER — Encounter: Payer: Self-pay | Admitting: Psychiatry

## 2023-12-05 ENCOUNTER — Telehealth: Payer: Self-pay | Admitting: Psychiatry

## 2023-12-05 DIAGNOSIS — G4700 Insomnia, unspecified: Secondary | ICD-10-CM

## 2023-12-05 DIAGNOSIS — F515 Nightmare disorder: Secondary | ICD-10-CM

## 2023-12-05 DIAGNOSIS — F331 Major depressive disorder, recurrent, moderate: Secondary | ICD-10-CM | POA: Diagnosis not present

## 2023-12-05 DIAGNOSIS — F411 Generalized anxiety disorder: Secondary | ICD-10-CM

## 2023-12-05 MED ORDER — L-METHYLFOLATE 7.5 MG PO TABS: 7.5000 mg | ORAL_TABLET | Freq: Every day | ORAL | 1 refills | Status: DC

## 2023-12-05 MED ORDER — PRAZOSIN HCL 2 MG PO CAPS: 2.0000 mg | ORAL_CAPSULE | Freq: Every day | ORAL | 1 refills | Status: DC

## 2023-12-05 NOTE — Patient Instructions (Signed)
 Continue venlafaxine 225 mg daily  Continue nortriptyline 25 mg at night  Continue Buspar 20 mg three times a day  Start prazosin 2 mg at night  Start l-methylfolate 7.5 mg daily  Continue Trazodone 200 mg at night as needed for sleep  Next appointment: 5/1 at 10 30

## 2023-12-26 ENCOUNTER — Other Ambulatory Visit: Payer: Self-pay | Admitting: Psychiatry

## 2023-12-26 DIAGNOSIS — F331 Major depressive disorder, recurrent, moderate: Secondary | ICD-10-CM

## 2024-01-09 ENCOUNTER — Other Ambulatory Visit: Payer: Self-pay | Admitting: Adult Health
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
## 2024-01-13 ENCOUNTER — Telehealth: Admitting: Obstetrics & Gynecology

## 2024-01-13 DIAGNOSIS — N950 Postmenopausal bleeding: Secondary | ICD-10-CM | POA: Diagnosis not present

## 2024-01-13 MED ORDER — PROGESTERONE 200 MG PO CAPS: ORAL_CAPSULE | ORAL | 11 refills | Status: DC

## 2024-01-13 NOTE — Progress Notes (Signed)
 My Chart connect visit: video + audio Pt is at home(with her dog) I am in my office Total time: 10 minutes    Follow up appointment  Response to prometrium   Chief Complaint  Patient presents with   Follow-up    On Progesterone     Last menstrual period 01/14/2021.  First 2 months had heavy bleeding with cramping Third month spotting and minimal pain Will continue to cycle and reassess bleeding volume and crmaping and decide on long term management strategy to minimize bleeding and cramping  MEDS ordered this encounter: Meds ordered this encounter  Medications   progesterone  (PROMETRIUM ) 200 MG capsule    Sig: Take 1 nightly the first 10 days of the month    Dispense:  30 capsule    Refill:  11    Orders for this encounter: No orders of the defined types were placed in this encounter.   Impression + Management Plan   ICD-10-CM   1. Post-menopausal bleeding: Cycle on Prometrium  200 mg x 10 days monthly  N95.0       Follow Up: Return in about 3 months (around 04/13/2024) for Follow up, with Dr Randolm Butte.     All questions were answered.  Past Medical History:  Diagnosis Date   Anxiety    Arthritis    spine   Bicornate uterus    Chronic back pain    started about 6 years ago - four bulging discs   Depression    Essential hypertension 08/22/2017   HLD (hyperlipidemia) 07/21/2023   Spinal stenosis     Past Surgical History:  Procedure Laterality Date   BREAST BIOPSY Left 01/31/2016   benign   BREAST BIOPSY Left 08/17/2013   benign   CESAREAN SECTION     3   DILATATION AND CURETTAGE/HYSTEROSCOPY WITH MINERVA N/A 02/21/2021   Procedure: DILATATION AND CURETTAGE/HYSTEROSCOPY WITH MINERVA;  Surgeon: Wendelyn Halter, MD;  Location: [facility];  Service: Gynecology;  Laterality: N/A;   TUBAL LIGATION      OB History     Gravida  5   Para  2   Term  2   Preterm      AB  3   Living  2      SAB      IAB  3   Ectopic      Multiple      Live Births   2        Obstetric Comments  Cesarean section x 2, due to bicornuate uterus and fetal malpresntaton BREECH, THEN REPEAT         Allergies  Allergen Reactions   Bupropion  Other (See Comments)    Worsening in SI, anxiety    Social History   Socioeconomic History   Marital status: Single    Spouse name: Not on file   Number of children: 2   Years of education: 12   Highest education level: Not on file  Occupational History   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  Tobacco Use   Smoking status: Every Day    Current packs/day: 0.50    Average packs/day: 0.5 packs/day for 28.3 years (14.2 ttl pk-yrs)    Types: Cigarettes    Start date: 09/17/1995   Smokeless tobacco: Never   Tobacco comments:    trying to quit  Vaping Use   Vaping status: Never Used  Substance and Sexual Activity   Alcohol use: No   Drug use: Yes    Types:  Marijuana    Comment: 2 joints per week   Sexual activity: Not Currently    Birth control/protection: Surgical, Post-menopausal    Comment: tubal  Other Topics Concern   Not on file  Social History Narrative   Single   Lives with [name] Sharlette Dayhoff   Feels unable to work due to back pain   Social Drivers of Corporate investment banker Strain: Not on file  Food Insecurity: Not on file  Transportation Needs: Not on file  Physical Activity: Not on file  Stress: Not on file  Social Connections: Not on file    Family History  Problem Relation Age of Onset   Hypertension Mother    Depression Mother    Hyperlipidemia Mother    Heart disease Mother    Heart attack Mother    Cancer Father        pancreatic   Mental illness Sister        schizoid   Diabetes Sister    Schizophrenia Sister    Cancer Maternal Grandmother        myleodysplastic syndrome   Depression Maternal Grandmother    Myelodysplastic syndrome Maternal Grandmother    Diabetes Maternal Grandmother    Hypertension Maternal Grandmother    Alcohol abuse Maternal Grandfather

## 2024-01-15 ENCOUNTER — Ambulatory Visit: Admitting: Psychiatry

## 2024-01-16 ENCOUNTER — Encounter: Payer: Self-pay | Admitting: Obstetrics & Gynecology

## 2024-01-16 MED ORDER — IBUPROFEN 800 MG PO TABS: 800.0000 mg | ORAL_TABLET | Freq: Three times a day (TID) | ORAL | 2 refills | Status: DC | PRN

## 2024-01-21 ENCOUNTER — Other Ambulatory Visit: Payer: Self-pay | Admitting: Psychiatry

## 2024-01-29 ENCOUNTER — Telehealth: Admitting: Psychiatry

## 2024-01-29 ENCOUNTER — Encounter: Payer: Self-pay | Admitting: Psychiatry

## 2024-01-29 DIAGNOSIS — F515 Nightmare disorder: Secondary | ICD-10-CM | POA: Diagnosis not present

## 2024-01-29 DIAGNOSIS — F331 Major depressive disorder, recurrent, moderate: Secondary | ICD-10-CM

## 2024-01-29 DIAGNOSIS — G4700 Insomnia, unspecified: Secondary | ICD-10-CM

## 2024-01-29 DIAGNOSIS — F411 Generalized anxiety disorder: Secondary | ICD-10-CM | POA: Diagnosis not present

## 2024-01-29 DIAGNOSIS — F330 Major depressive disorder, recurrent, mild: Secondary | ICD-10-CM | POA: Diagnosis not present

## 2024-01-29 MED ORDER — NORTRIPTYLINE HCL 25 MG PO CAPS: 25.0000 mg | ORAL_CAPSULE | Freq: Every day | ORAL | 0 refills | Status: DC

## 2024-01-29 MED ORDER — PRAZOSIN HCL 2 MG PO CAPS: 2.0000 mg | ORAL_CAPSULE | Freq: Every day | ORAL | 0 refills | Status: DC

## 2024-01-29 MED ORDER — VENLAFAXINE HCL ER 75 MG PO CP24: 75.0000 mg | ORAL_CAPSULE | Freq: Every day | ORAL | 1 refills | Status: DC

## 2024-01-29 NOTE — Patient Instructions (Signed)
 Continue venlafaxine  225 mg daily  Continue nortriptyline  25 mg at night  Continue Buspar  20 mg three times a day  Continue prazosin  2 mg at night  Hold l-methylfolate 7.5 mg daily  Continue Trazodone  200 mg at night as needed for sleep  Next appointment: 6/16,
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
## 2024-02-01 ENCOUNTER — Other Ambulatory Visit: Payer: Self-pay | Admitting: Psychiatry

## 2024-02-18 ENCOUNTER — Encounter: Payer: Self-pay | Admitting: Obstetrics & Gynecology

## 2024-02-18 ENCOUNTER — Ambulatory Visit: Admitting: Obstetrics & Gynecology

## 2024-02-18 ENCOUNTER — Other Ambulatory Visit (HOSPITAL_COMMUNITY)
Admission: RE | Admit: 2024-02-18 | Discharge: 2024-02-18 | Disposition: A | Discharge status: Home / Self Care | Source: Ambulatory Visit | Attending: Obstetrics & Gynecology | Admitting: Obstetrics & Gynecology

## 2024-02-18 VITALS — BP 132/71 | HR 76 | Ht 67.0 in | Wt 211.0 lb

## 2024-02-18 DIAGNOSIS — N951 Menopausal and female climacteric states: Secondary | ICD-10-CM

## 2024-02-18 DIAGNOSIS — Z01419 Encounter for gynecological examination (general) (routine) without abnormal findings: Secondary | ICD-10-CM | POA: Diagnosis present

## 2024-02-18 DIAGNOSIS — Z79890 Hormone replacement therapy (postmenopausal): Secondary | ICD-10-CM | POA: Diagnosis not present

## 2024-02-18 DIAGNOSIS — N3941 Urge incontinence: Secondary | ICD-10-CM

## 2024-02-18 DIAGNOSIS — Z1231 Encounter for screening mammogram for malignant neoplasm of breast: Secondary | ICD-10-CM

## 2024-02-18 MED ORDER — PROGESTERONE 200 MG PO CAPS: 200.0000 mg | ORAL_CAPSULE | Freq: Every evening | ORAL | 4 refills | Status: DC

## 2024-02-18 MED ORDER — ESTRADIOL 2 MG PO TABS: 2.0000 mg | ORAL_TABLET | Freq: Every day | ORAL | 4 refills | Status: AC

## 2024-02-18 MED ORDER — MIRABEGRON ER 50 MG PO TB24: 50.0000 mg | ORAL_TABLET | Freq: Every day | ORAL | 4 refills | Status: AC

## 2024-02-18 NOTE — Progress Notes (Addendum)
 WELL-WOMAN EXAMINATION Patient name: Tiffany Morgan MRN [id number]  Date of birth: Aug 11, 1971 Chief Complaint:   Gynecologic Exam  History of Present Illness:   Tiffany Morgan is a 53 y.o. [phone] PM female being seen today for a routine well-woman exam and the following concerns  -HRT: She is currently on estradiol  2 mg and progesterone  10mg  x 10 days. Rates her symptoms 4-5/10. Still has occasional hot flash/night sweat- about 3x per day at most.  Of note, she feels like the progesterone  may help with her mood and would be curious to be taking this regularly.  In review patient previously had PMB- work up including EMB was negative and since being on progesterone  she does not have further bleeding.  Denies vaginal discharge, itching or irritation.  Prior endometrial ablation 2022  +Urgency and urge incontinence: Previously treated with anticholinergic and noted significant side effects.  Patient given sample of Gemtesa however was not covered by her insurance though she does feel like this did help  She has noted some chronic boils- using Dial soap and using the cream when needed.  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  Last pap h/o .  Last mammogram: 09/2022.      02/13/2023    2:14 PM 12/12/2022    3:11 PM 11/14/2022    4:05 PM 10/29/2022    3:00 PM 10/08/2022    3:18 PM  Depression screen PHQ 2/9  Decreased Interest 1      Down, Depressed, Hopeless 1      PHQ - 2 Score 2      Altered sleeping 2      Tired, decreased energy 2      Change in appetite 2      Feeling bad or failure about yourself  1      Trouble concentrating 1      Moving slowly or fidgety/restless 0      Suicidal thoughts 0      PHQ-9 Score 10      Difficult doing work/chores          Information is confidential and restricted. Go to Review Flowsheets to unlock data.      Review of Systems:    Pertinent items are noted in HPI Denies any headaches, blurred vision, fatigue, shortness of breath, chest pain, abdominal pain, bowel movements, urination, or intercourse unless otherwise stated above.  Pertinent History Reviewed:  Reviewed past medical,surgical, social and family history.  Reviewed problem list, medications and allergies. Physical Assessment:   Vitals:   02/18/24 0957  BP: 132/71  Pulse: 76  Weight: 211 lb (95.7 kg)  Height: 5\' 7"  (1.702 m)  Body mass index is 33.05 kg/m.        Physical Examination:   General appearance - well appearing, and in no distress  Mental status - alert, oriented to person, place, and time  Psych:  She has a normal mood and affect  Skin - warm and dry, normal color, no suspicious lesions noted  Chest - effort normal, all lung fields clear to auscultation bilaterally  Heart - normal rate and regular rhythm  Neck:  midline trachea, no thyromegaly or nodules  Breasts - breasts appear normal, no suspicious masses, no skin or nipple changes or  axillary nodes  Abdomen - soft, nontender, nondistended, no masses or organomegaly  Pelvic - VULVA: normal appearing  vulva with no masses, tenderness or lesions  VAGINA: normal appearing vagina with normal color and discharge, no lesions  CERVIX: normal appearing cervix without discharge or lesions, no CMT  Thin prep pap is done with HR HPV cotesting  UTERUS: uterus is felt to be normal size, shape, consistency and nontender   ADNEXA: No adnexal masses or tenderness noted.  Extremities:  No swelling or varicosities noted  Chaperone: Lorean Rodes     Assessment & Plan:  1) Well-Woman Exam -Pap collected based on ASCCP guidelines, further management pending results - Mammogram ordered  2) HRT - Continue with Estrace  daily, will increase to her Prometrium  daily   3) Urge incontinence - Rx for Myrbetriq sent in to see if better coverage this year  Orders Placed This Encounter  Procedures    MM 3D SCREENING MAMMOGRAM BILATERAL BREAST    Meds:  Meds ordered this encounter  Medications   estradiol  (ESTRACE ) 2 MG tablet    Sig: Take 1 tablet (2 mg total) by mouth daily.    Dispense:  90 tablet    Refill:  4   progesterone  (PROMETRIUM ) 200 MG capsule    Sig: Take 1 capsule (200 mg total) by mouth at bedtime. Take 1 nightly the first 10 days of the month    Dispense:  90 capsule    Refill:  4   mirabegron ER (MYRBETRIQ) 50 MG TB24 tablet    Sig: Take 1 tablet (50 mg total) by mouth daily.    Dispense:  90 tablet    Refill:  4    Follow-up: Return in about 1 year (around 02/17/2025) for Annual.   Shantrell Placzek, DO Attending Obstetrician & Gynecologist, [facility] for [facility], [facility]

## 2024-02-24 ENCOUNTER — Ambulatory Visit: Payer: Self-pay | Admitting: Obstetrics & Gynecology

## 2024-02-24 LAB — CYTOLOGY - PAP
Adequacy: ABSENT
Chlamydia: NEGATIVE
Comment: NEGATIVE
Comment: NEGATIVE
Comment: NORMAL
Diagnosis: UNDETERMINED — AB
High risk HPV: NEGATIVE
Neisseria Gonorrhea: NEGATIVE
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
## 2024-03-01 ENCOUNTER — Encounter: Payer: Self-pay | Admitting: Psychiatry

## 2024-03-01 ENCOUNTER — Ambulatory Visit: Admitting: Psychiatry

## 2024-03-01 ENCOUNTER — Telehealth (INDEPENDENT_AMBULATORY_CARE_PROVIDER_SITE_OTHER): Admitting: Psychiatry

## 2024-03-01 DIAGNOSIS — F515 Nightmare disorder: Secondary | ICD-10-CM | POA: Diagnosis not present

## 2024-03-01 DIAGNOSIS — F330 Major depressive disorder, recurrent, mild: Secondary | ICD-10-CM | POA: Diagnosis not present

## 2024-03-01 DIAGNOSIS — F411 Generalized anxiety disorder: Secondary | ICD-10-CM | POA: Diagnosis not present

## 2024-03-01 DIAGNOSIS — F40240 Claustrophobia: Secondary | ICD-10-CM
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
## 2024-03-01 NOTE — Patient Instructions (Signed)
 Continue venlafaxine  225 mg daily Continue nortriptyline  25 mg at night  Continue Buspar  20 mg three times a day  Increase prazosin  4 mg at night  Continue Trazodone  200 mg at night as needed for sleep  Next appointment: 7/30 at 9 30 [facility] [phone]

## 2024-03-01 NOTE — Telephone Encounter (Signed)
 Appointment is scheduled later today

## 2024-03-24 ENCOUNTER — Other Ambulatory Visit: Payer: Self-pay | Admitting: Obstetrics & Gynecology

## 2024-04-01 ENCOUNTER — Telehealth: Payer: Self-pay

## 2024-04-01 ENCOUNTER — Other Ambulatory Visit: Payer: Self-pay | Admitting: Psychiatry

## 2024-04-01 MED ORDER — PRAZOSIN HCL 2 MG PO CAPS: 4.0000 mg | ORAL_CAPSULE | Freq: Every day | ORAL | 0 refills | Status: DC

## 2024-04-01 NOTE — Telephone Encounter (Signed)
 The order has been sent

## 2024-04-01 NOTE — Telephone Encounter (Signed)
 Patient called to request  a refill of  prazosin  (MINIPRESS ) 2 MG capsule   Last visit 03-01-24  Next visit 04-08-24   Preferred Pharmacies   [facility] [place], [place] - [address] [place] Phone: [phone]  Fax: [phone]
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
## 2024-04-08 ENCOUNTER — Telehealth: Admitting: Psychiatry

## 2024-04-08 ENCOUNTER — Encounter: Payer: Self-pay | Admitting: Psychiatry

## 2024-04-08 DIAGNOSIS — G4700 Insomnia, unspecified: Secondary | ICD-10-CM

## 2024-04-08 DIAGNOSIS — F4310 Post-traumatic stress disorder, unspecified: Secondary | ICD-10-CM | POA: Diagnosis not present

## 2024-04-08 DIAGNOSIS — F40240 Claustrophobia: Secondary | ICD-10-CM | POA: Diagnosis not present

## 2024-04-08 DIAGNOSIS — F330 Major depressive disorder, recurrent, mild: Secondary | ICD-10-CM

## 2024-04-08 DIAGNOSIS — F411 Generalized anxiety disorder: Secondary | ICD-10-CM

## 2024-04-08 MED ORDER — VENLAFAXINE HCL ER 150 MG PO CP24: 150.0000 mg | ORAL_CAPSULE | Freq: Every day | ORAL | 1 refills | Status: DC

## 2024-04-08 MED ORDER — BUSPIRONE HCL 10 MG PO TABS: 20.0000 mg | ORAL_TABLET | Freq: Three times a day (TID) | ORAL | 1 refills | Status: DC

## 2024-04-08 NOTE — Patient Instructions (Signed)
 Continue venlafaxine  225 mg daily  Continue nortriptyline  25 mg at night Continue Buspar  20 mg three times a day Continue prazosin  4 mg at night  Continue Trazodone  200 mg at night as needed for sleep  Next appointment: 8/29 at 11 30

## 2024-04-13 ENCOUNTER — Telehealth: Admitting: Obstetrics & Gynecology

## 2024-04-13 ENCOUNTER — Encounter: Payer: Self-pay | Admitting: Obstetrics & Gynecology

## 2024-04-13 DIAGNOSIS — N950 Postmenopausal bleeding: Secondary | ICD-10-CM | POA: Diagnosis not present

## 2024-04-13 DIAGNOSIS — Z79890 Hormone replacement therapy (postmenopausal): Secondary | ICD-10-CM | POA: Diagnosis not present

## 2024-04-13 DIAGNOSIS — N951 Menopausal and female climacteric states: Secondary | ICD-10-CM

## 2024-04-13 MED ORDER — PROGESTERONE 200 MG PO CAPS: ORAL_CAPSULE | ORAL | 11 refills | Status: AC

## 2024-04-13 NOTE — Progress Notes (Signed)
 MyChart connect visit: video + audio Pt is at home and I am in my office Total time : 10 minutes    Follow up appointment   Chief Complaint  Patient presents with   Follow-up    PMB    Last menstrual period 01/14/2021.  Bleeding is now none of the cyclical Prometrium  which means she has enough endometrial suppression even given her Estrace  2 mg daily that we should be able to go to daily Prometrium  and chronically suppress her endometrium without any bleeding  She does have cramping when she comes off the Prometrium  so we will do it daily had to call her and remind her of that because that was unsure if she was still taking the Estrace   If she has any problems taking it daily she will let me know  MEDS ordered this encounter: Meds ordered this encounter  Medications   progesterone  (PROMETRIUM ) 200 MG capsule    Sig: Take daily at bedtime    Dispense:  30 capsule    Refill:  11    Orders for this encounter: No orders of the defined types were placed in this encounter.   Impression + Management Plan   ICD-10-CM   1. Hormone replacement therapy (HRT)  Z79.890     2. Vasomotor symptoms due to menopause  N95.1     3. Post-menopausal bleeding: Cycle on Prometrium  200 mg x 10 days monthly, now none switch to back to daily  N95.0       Follow Up: Return if symptoms worsen or fail to improve.     All questions were answered.  Past Medical History:  Diagnosis Date   Anxiety    Arthritis    spine   Bicornate uterus    Chronic back pain    started about 6 years ago - four bulging discs   Depression    Essential hypertension 08/22/2017   HLD (hyperlipidemia) 07/21/2023   Spinal stenosis     Past Surgical History:  Procedure Laterality Date   BREAST BIOPSY Left 01/31/2016   benign   BREAST BIOPSY Left 08/17/2013   benign   CESAREAN SECTION     3   DILATATION AND CURETTAGE/HYSTEROSCOPY WITH MINERVA N/A 02/21/2021   Procedure: DILATATION AND  CURETTAGE/HYSTEROSCOPY WITH MINERVA;  Surgeon: Jayne Vonn DEL, MD;  Location: [facility];  Service: Gynecology;  Laterality: N/A;   TUBAL LIGATION      OB History     Gravida  5   Para  2   Term  2   Preterm      AB  3   Living  2      SAB      IAB  3   Ectopic      Multiple      Live Births  2        Obstetric Comments  Cesarean section x 2, due to bicornuate uterus and fetal malpresntaton BREECH, THEN REPEAT         Allergies  Allergen Reactions   Bupropion  Other (See Comments)    Worsening in SI, anxiety    Social History   Socioeconomic History   Marital status: Single    Spouse name: Not on file   Number of children: 2   Years of education: 12   Highest education level: Not on file  Occupational History   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  Tobacco Use   Smoking status: Every Day    Current  packs/day: 0.50    Average packs/day: 0.5 packs/day for 28.6 years (14.3 ttl pk-yrs)    Types: Cigarettes    Start date: 09/17/1995   Smokeless tobacco: Never   Tobacco comments:    trying to quit  Vaping Use   Vaping status: Never Used  Substance and Sexual Activity   Alcohol use: No   Drug use: Yes    Types: Marijuana    Comment: 2 joints per week   Sexual activity: Not Currently    Birth control/protection: Surgical, Post-menopausal    Comment: tubal  Other Topics Concern   Not on file  Social History Narrative   Single   Lives with [name] Glade   Feels unable to work due to back pain   Social Drivers of Corporate investment banker Strain: Not on file  Food Insecurity: Not on file  Transportation Needs: Not on file  Physical Activity: Not on file  Stress: Not on file  Social Connections: Not on file    Family History  Problem Relation Age of Onset   Hypertension Mother    Depression Mother    Hyperlipidemia Mother    Heart disease Mother    Heart attack Mother    Cancer Father        pancreatic   Mental illness Sister         schizoid   Diabetes Sister    Schizophrenia Sister    Cancer Maternal Grandmother        myleodysplastic syndrome   Depression Maternal Grandmother    Myelodysplastic syndrome Maternal Grandmother    Diabetes Maternal Grandmother    Hypertension Maternal Grandmother    Alcohol abuse Maternal Grandfather
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
## 2024-05-14 ENCOUNTER — Encounter: Payer: Self-pay | Admitting: Psychiatry

## 2024-05-14 ENCOUNTER — Telehealth: Admitting: Psychiatry

## 2024-05-14 DIAGNOSIS — F40240 Claustrophobia: Secondary | ICD-10-CM

## 2024-05-14 DIAGNOSIS — F4310 Post-traumatic stress disorder, unspecified: Secondary | ICD-10-CM

## 2024-05-14 DIAGNOSIS — G4700 Insomnia, unspecified: Secondary | ICD-10-CM

## 2024-05-14 DIAGNOSIS — F330 Major depressive disorder, recurrent, mild: Secondary | ICD-10-CM

## 2024-05-14 DIAGNOSIS — F411 Generalized anxiety disorder: Secondary | ICD-10-CM | POA: Diagnosis not present

## 2024-05-14 DIAGNOSIS — Z79899 Other long term (current) drug therapy: Secondary | ICD-10-CM

## 2024-05-14 MED ORDER — QUETIAPINE FUMARATE 25 MG PO TABS: 25.0000 mg | ORAL_TABLET | Freq: Every day | ORAL | 1 refills | Status: DC

## 2024-05-14 NOTE — Patient Instructions (Signed)
 Continue venlafaxine  225 mg daily  Continue nortriptyline  25 mg at night  Continue Buspar  20 mg three times a day Start quetiapine  25 mg at night  Continue prazosin  4 mg at night  Continue Trazodone  200 mg at night as needed for sleep  Next appointment: 10/23 at 10 am, [facility] [phone]

## 2024-05-19 ENCOUNTER — Telehealth: Payer: Self-pay

## 2024-05-19 NOTE — Telephone Encounter (Signed)
 pt left a message that there was a issue with her getting the new medication (quetiapine ). pt was last seen on 8-29 next appt 10-23

## 2024-05-19 NOTE — Telephone Encounter (Signed)
 called pharmacy rx is ready for pick up and it is $4.00

## 2024-05-19 NOTE — Telephone Encounter (Signed)
 pt notified that rx was ready for pick up and is $4.00

## 2024-05-24 ENCOUNTER — Other Ambulatory Visit (HOSPITAL_COMMUNITY): Payer: Self-pay | Admitting: Family Medicine

## 2024-05-24 DIAGNOSIS — M259 Joint disorder, unspecified: Secondary | ICD-10-CM

## 2024-05-31 ENCOUNTER — Other Ambulatory Visit: Payer: Self-pay | Admitting: Psychiatry

## 2024-05-31 DIAGNOSIS — F331 Major depressive disorder, recurrent, moderate: Secondary | ICD-10-CM

## 2024-06-01 ENCOUNTER — Ambulatory Visit (HOSPITAL_COMMUNITY)
Admission: RE | Admit: 2024-06-01 | Discharge: 2024-06-01 | Disposition: A | Discharge status: Home / Self Care | Source: Ambulatory Visit | Attending: Family Medicine | Admitting: Family Medicine

## 2024-06-01 DIAGNOSIS — M259 Joint disorder, unspecified: Secondary | ICD-10-CM | POA: Insufficient documentation

## 2024-06-07 ENCOUNTER — Ambulatory Visit (HOSPITAL_COMMUNITY)

## 2024-06-29 ENCOUNTER — Other Ambulatory Visit: Payer: Self-pay | Admitting: Psychiatry

## 2024-06-29 ENCOUNTER — Telehealth: Payer: Self-pay

## 2024-06-29 MED ORDER — PRAZOSIN HCL 2 MG PO CAPS
4.0000 mg | ORAL_CAPSULE | Freq: Every day | ORAL | 0 refills | Status: AC
Start: 2024-06-30 — End: 2024-09-28

## 2024-06-29 NOTE — Telephone Encounter (Signed)
 Ordered

## 2024-06-29 NOTE — Telephone Encounter (Signed)
 Medication refill request - fax from pt's [facility] requesting a new Prazosin  2 mg order, last provided 04/01/24 for a 90 day supply and pt returns next on 07/08/24. Medication last filled 04/01/24.

## 2024-07-04 ENCOUNTER — Other Ambulatory Visit: Payer: Self-pay | Admitting: Psychiatry
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
## 2024-07-08 ENCOUNTER — Telehealth (INDEPENDENT_AMBULATORY_CARE_PROVIDER_SITE_OTHER): Admitting: Psychiatry

## 2024-07-08 ENCOUNTER — Telehealth: Payer: Self-pay

## 2024-07-08 ENCOUNTER — Encounter: Payer: Self-pay | Admitting: Psychiatry

## 2024-07-08 DIAGNOSIS — G4700 Insomnia, unspecified: Secondary | ICD-10-CM

## 2024-07-08 DIAGNOSIS — F330 Major depressive disorder, recurrent, mild: Secondary | ICD-10-CM | POA: Diagnosis not present

## 2024-07-08 DIAGNOSIS — F4310 Post-traumatic stress disorder, unspecified: Secondary | ICD-10-CM

## 2024-07-08 DIAGNOSIS — F411 Generalized anxiety disorder: Secondary | ICD-10-CM

## 2024-07-08 DIAGNOSIS — F40240 Claustrophobia: Secondary | ICD-10-CM | POA: Diagnosis not present

## 2024-07-08 MED ORDER — TRAZODONE HCL 100 MG PO TABS: 200.0000 mg | ORAL_TABLET | Freq: Every evening | ORAL | 1 refills | Status: AC | PRN

## 2024-07-08 MED ORDER — VENLAFAXINE HCL ER 75 MG PO CP24: 75.0000 mg | ORAL_CAPSULE | Freq: Every day | ORAL | 1 refills | Status: AC

## 2024-07-08 MED ORDER — QUETIAPINE FUMARATE 25 MG PO TABS
25.0000 mg | ORAL_TABLET | Freq: Every day | ORAL | 0 refills | Status: AC
Start: 2024-07-08 — End: 2024-10-06

## 2024-07-08 NOTE — Telephone Encounter (Signed)
 Medication management - Prior authorization for pt's Quetiapine  25 mg submitted online with CoverMyMeds and sent to patient's [employer] for review and pending decision.

## 2024-07-08 NOTE — Patient Instructions (Signed)
 Continue venlafaxine  225 mg daily  Continue nortriptyline  25 mg at night  Continue Buspar  20 mg three times a day  Continue quetiapine  25 mg at night  Continue prazosin  4 mg at night  Continue Trazodone  200 mg at night as needed for sleep  Next appointment: 12/11 at 10:30

## 2024-07-22 ENCOUNTER — Other Ambulatory Visit (HOSPITAL_COMMUNITY): Payer: Self-pay

## 2024-07-22 DIAGNOSIS — R55 Syncope and collapse: Secondary | ICD-10-CM

## 2024-07-26 ENCOUNTER — Ambulatory Visit (HOSPITAL_COMMUNITY)
Admission: RE | Admit: 2024-07-26 | Discharge: 2024-07-26 | Disposition: A | Discharge status: Home / Self Care | Source: Ambulatory Visit

## 2024-07-26 DIAGNOSIS — R55 Syncope and collapse: Secondary | ICD-10-CM | POA: Diagnosis present

## 2024-08-13 ENCOUNTER — Other Ambulatory Visit: Payer: Self-pay | Admitting: Obstetrics & Gynecology
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
## 2024-08-26 ENCOUNTER — Encounter: Payer: Self-pay | Admitting: Psychiatry

## 2024-08-26 ENCOUNTER — Telehealth (INDEPENDENT_AMBULATORY_CARE_PROVIDER_SITE_OTHER): Admitting: Psychiatry

## 2024-08-26 DIAGNOSIS — G4700 Insomnia, unspecified: Secondary | ICD-10-CM

## 2024-08-26 DIAGNOSIS — F411 Generalized anxiety disorder: Secondary | ICD-10-CM | POA: Diagnosis not present

## 2024-08-26 DIAGNOSIS — F40240 Claustrophobia: Secondary | ICD-10-CM | POA: Diagnosis not present

## 2024-08-26 DIAGNOSIS — Z79899 Other long term (current) drug therapy: Secondary | ICD-10-CM

## 2024-08-26 DIAGNOSIS — F3341 Major depressive disorder, recurrent, in partial remission: Secondary | ICD-10-CM | POA: Diagnosis not present

## 2024-08-26 DIAGNOSIS — F4310 Post-traumatic stress disorder, unspecified: Secondary | ICD-10-CM

## 2024-08-26 MED ORDER — QUETIAPINE FUMARATE 25 MG PO TABS: 25.0000 mg | ORAL_TABLET | Freq: Every day | ORAL | 0 refills | Status: AC

## 2024-08-26 MED ORDER — PRAZOSIN HCL 2 MG PO CAPS: 4.0000 mg | ORAL_CAPSULE | Freq: Every day | ORAL | 1 refills | Status: AC

## 2024-08-26 NOTE — Patient Instructions (Signed)
 Continue venlafaxine  225 mg daily  Continue nortriptyline  25 mg at night  Continue Buspar  20 mg three times a day Continue quetiapine  25 mg at night  Continue prazosin  4 mg at night  Continue Trazodone  200 mg at night as needed for sleep  Next appointment: 2/2 at 1 pm

## 2024-09-13 ENCOUNTER — Telehealth: Payer: Self-pay

## 2024-09-13 NOTE — Telephone Encounter (Signed)
 Pt calling she needed refill for QUEtiapine  (SEROQUEL ) 25 MG tablet  I called pharmacy who stated there was are fillpt should be able to pick up medication [facility] - [place], [place] - [address] pt who confirmed and verbalized understanding.
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
## 2024-10-18 ENCOUNTER — Telehealth: Admitting: Psychiatry

## 2024-10-18 ENCOUNTER — Encounter: Payer: Self-pay | Admitting: Psychiatry

## 2024-10-18 DIAGNOSIS — F411 Generalized anxiety disorder: Secondary | ICD-10-CM

## 2024-10-18 DIAGNOSIS — F3341 Major depressive disorder, recurrent, in partial remission: Secondary | ICD-10-CM | POA: Diagnosis not present

## 2024-10-18 DIAGNOSIS — F4310 Post-traumatic stress disorder, unspecified: Secondary | ICD-10-CM | POA: Diagnosis not present

## 2024-10-18 DIAGNOSIS — G4700 Insomnia, unspecified: Secondary | ICD-10-CM | POA: Diagnosis not present

## 2024-10-18 MED ORDER — BUSPIRONE HCL 10 MG PO TABS: 20.0000 mg | ORAL_TABLET | Freq: Three times a day (TID) | ORAL | 1 refills | Status: AC

## 2024-10-18 MED ORDER — VENLAFAXINE HCL ER 150 MG PO CP24: 150.0000 mg | ORAL_CAPSULE | Freq: Every day | ORAL | 1 refills | Status: AC

## 2024-10-18 NOTE — Patient Instructions (Signed)
 Continue venlafaxine  225 mg daily  Continue Buspar  20 mg three times a day Continue quetiapine  25 mg at night Continue prazosin  4 mg at night  Continue Trazodone  200 mg at night as needed for sleep  Next appointment: 4/7 at 10:30

## 2024-11-18 ENCOUNTER — Ambulatory Visit: Admitting: Neurology
